# Patient Record
Sex: Male | Born: 1985
Health system: Southern US, Community
[De-identification: ages and names within clinical notes are randomized; demographics above are authoritative.]

## PROBLEM LIST (undated history)

## (undated) ENCOUNTER — Ambulatory Visit

## (undated) ENCOUNTER — Emergency Department (HOSPITAL_COMMUNITY): Discharge status: Absent without leave | Payer: Self-pay

## (undated) DIAGNOSIS — F909 Attention-deficit hyperactivity disorder, unspecified type: Secondary | ICD-10-CM

## (undated) DIAGNOSIS — F1910 Other psychoactive substance abuse, uncomplicated: Secondary | ICD-10-CM

## (undated) DIAGNOSIS — Z21 Asymptomatic human immunodeficiency virus [HIV] infection status: Secondary | ICD-10-CM

---
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
## 2006-06-22 ENCOUNTER — Emergency Department (HOSPITAL_COMMUNITY): Admission: EM | Admit: 2006-06-22 | Discharge: 2006-06-22 | Payer: Self-pay | Admitting: Emergency Medicine

## 2007-03-08 ENCOUNTER — Emergency Department (HOSPITAL_COMMUNITY): Admission: EM | Admit: 2007-03-08 | Discharge: 2007-03-08 | Payer: Self-pay | Admitting: Emergency Medicine

## 2008-03-07 ENCOUNTER — Emergency Department (HOSPITAL_COMMUNITY): Admission: EM | Admit: 2008-03-07 | Discharge: 2008-03-07 | Payer: Self-pay | Admitting: Emergency Medicine

## 2008-03-24 ENCOUNTER — Ambulatory Visit: Payer: Self-pay | Admitting: Psychiatry

## 2008-03-24 ENCOUNTER — Emergency Department (HOSPITAL_COMMUNITY): Admission: EM | Admit: 2008-03-24 | Discharge: 2008-03-24 | Payer: Self-pay | Admitting: Emergency Medicine

## 2008-03-24 ENCOUNTER — Inpatient Hospital Stay (HOSPITAL_COMMUNITY): Admission: RE | Admit: 2008-03-24 | Discharge: 2008-03-27 | Payer: Self-pay | Admitting: Psychiatry

## 2008-09-07 ENCOUNTER — Emergency Department (HOSPITAL_COMMUNITY): Admission: EM | Admit: 2008-09-07 | Discharge: 2008-09-07 | Payer: Self-pay | Admitting: Emergency Medicine

## 2009-11-19 ENCOUNTER — Emergency Department (HOSPITAL_COMMUNITY): Admission: EM | Admit: 2009-11-19 | Discharge: 2009-11-20 | Payer: Self-pay | Admitting: Emergency Medicine

## 2009-12-07 ENCOUNTER — Ambulatory Visit (HOSPITAL_COMMUNITY): Admission: RE | Admit: 2009-12-07 | Discharge: 2009-12-07 | Payer: Self-pay | Admitting: Psychiatry

## 2009-12-08 ENCOUNTER — Emergency Department (HOSPITAL_COMMUNITY): Admission: EM | Admit: 2009-12-08 | Discharge: 2009-12-09 | Payer: Self-pay | Admitting: Emergency Medicine

## 2010-04-25 ENCOUNTER — Emergency Department (HOSPITAL_COMMUNITY): Admission: EM | Admit: 2010-04-25 | Discharge: 2010-04-25 | Payer: Self-pay | Admitting: Emergency Medicine

## 2010-07-23 ENCOUNTER — Emergency Department (HOSPITAL_COMMUNITY)
Admission: EM | Admit: 2010-07-23 | Discharge: 2010-07-23 | Payer: Self-pay | Source: Home / Self Care | Admitting: Emergency Medicine

## 2010-08-02 LAB — GC/CHLAMYDIA PROBE AMP, GENITAL
Chlamydia, DNA Probe: NEGATIVE
GC Probe Amp, Genital: NEGATIVE

## 2010-10-04 LAB — URINALYSIS, ROUTINE W REFLEX MICROSCOPIC
Bilirubin Urine: NEGATIVE
Glucose, UA: NEGATIVE mg/dL
Hgb urine dipstick: NEGATIVE
Ketones, ur: NEGATIVE mg/dL
Nitrite: NEGATIVE
Protein, ur: NEGATIVE mg/dL
Specific Gravity, Urine: 1.012 (ref 1.005–1.030)
Urobilinogen, UA: 2 mg/dL — ABNORMAL HIGH (ref 0.0–1.0)
pH: 6.5 (ref 5.0–8.0)

## 2010-10-04 LAB — RAPID URINE DRUG SCREEN, HOSP PERFORMED
Amphetamines: NOT DETECTED
Barbiturates: NOT DETECTED
Benzodiazepines: POSITIVE — AB
Cocaine: NOT DETECTED
Opiates: NOT DETECTED
Tetrahydrocannabinol: POSITIVE — AB

## 2010-10-04 LAB — CBC
HCT: 49.9 % (ref 39.0–52.0)
Hemoglobin: 16.9 g/dL (ref 13.0–17.0)
MCHC: 33.9 g/dL (ref 30.0–36.0)
MCV: 96 fL (ref 78.0–100.0)
Platelets: 243 10*3/uL (ref 150–400)
RBC: 5.2 MIL/uL (ref 4.22–5.81)
RDW: 13.9 % (ref 11.5–15.5)
WBC: 8.2 10*3/uL (ref 4.0–10.5)

## 2010-10-04 LAB — COMPREHENSIVE METABOLIC PANEL
ALT: 29 U/L (ref 0–53)
AST: 24 U/L (ref 0–37)
Albumin: 4.6 g/dL (ref 3.5–5.2)
Alkaline Phosphatase: 75 U/L (ref 39–117)
BUN: 9 mg/dL (ref 6–23)
CO2: 29 mEq/L (ref 19–32)
Calcium: 9.4 mg/dL (ref 8.4–10.5)
Chloride: 102 mEq/L (ref 96–112)
Creatinine, Ser: 0.88 mg/dL (ref 0.4–1.5)
GFR calc Af Amer: >60 mL/min (ref >60)
GFR calc non Af Amer: >60 mL/min (ref >60)
Glucose, Bld: 98 mg/dL (ref 70–99)
Potassium: 3.2 mEq/L — ABNORMAL LOW (ref 3.5–5.1)
Sodium: 139 mEq/L (ref 135–145)
Total Bilirubin: 1 mg/dL (ref 0.3–1.2)
Total Protein: 7.5 g/dL (ref 6.0–8.3)

## 2010-10-04 LAB — DIFFERENTIAL
Basophils Absolute: 0 10*3/uL (ref 0.0–0.1)
Basophils Relative: 0 % (ref 0–1)
Eosinophils Absolute: 0.2 10*3/uL (ref 0.0–0.7)
Eosinophils Relative: 2 % (ref 0–5)
Lymphocytes Relative: 30 % (ref 12–46)
Lymphs Abs: 2.5 10*3/uL (ref 0.7–4.0)
Monocytes Absolute: 0.6 10*3/uL (ref 0.1–1.0)
Monocytes Relative: 7 % (ref 3–12)
Neutro Abs: 5 10*3/uL (ref 1.7–7.7)
Neutrophils Relative %: 60 % (ref 43–77)

## 2010-10-04 LAB — GC/CHLAMYDIA PROBE AMP, GENITAL
Chlamydia, DNA Probe: NEGATIVE
GC Probe Amp, Genital: NEGATIVE

## 2010-10-04 LAB — ETHANOL: Alcohol, Ethyl (B): 11 mg/dL — ABNORMAL HIGH (ref 0–10)

## 2010-10-05 ENCOUNTER — Emergency Department (HOSPITAL_COMMUNITY)
Admission: EM | Admit: 2010-10-05 | Discharge: 2010-10-05 | Disposition: A | Discharge status: Home / Self Care | Payer: Self-pay | Attending: Emergency Medicine | Admitting: Emergency Medicine

## 2010-10-05 DIAGNOSIS — F1010 Alcohol abuse, uncomplicated: Secondary | ICD-10-CM | POA: Insufficient documentation

## 2010-10-05 DIAGNOSIS — F1210 Cannabis abuse, uncomplicated: Secondary | ICD-10-CM | POA: Insufficient documentation

## 2010-10-05 DIAGNOSIS — F17200 Nicotine dependence, unspecified, uncomplicated: Secondary | ICD-10-CM | POA: Insufficient documentation

## 2010-10-05 LAB — CBC
HCT: 51.3 % (ref 39.0–52.0)
Hemoglobin: 17.3 g/dL — ABNORMAL HIGH (ref 13.0–17.0)
MCHC: 33.7 g/dL (ref 30.0–36.0)
MCV: 96.1 fL (ref 78.0–100.0)
Platelets: 253 10*3/uL (ref 150–400)
RBC: 5.34 MIL/uL (ref 4.22–5.81)
RDW: 13.8 % (ref 11.5–15.5)
WBC: 9.3 10*3/uL (ref 4.0–10.5)

## 2010-10-05 LAB — RAPID URINE DRUG SCREEN, HOSP PERFORMED
Amphetamines: NOT DETECTED
Barbiturates: NOT DETECTED
Benzodiazepines: NOT DETECTED
Cocaine: NOT DETECTED
Opiates: NOT DETECTED
Tetrahydrocannabinol: POSITIVE — AB

## 2010-10-05 LAB — BASIC METABOLIC PANEL
BUN: 10 mg/dL (ref 6–23)
CO2: 24 mEq/L (ref 19–32)
Calcium: 9.5 mg/dL (ref 8.4–10.5)
Chloride: 103 mEq/L (ref 96–112)
Creatinine, Ser: 0.93 mg/dL (ref 0.4–1.5)
GFR calc Af Amer: >60 mL/min (ref >60)
GFR calc non Af Amer: >60 mL/min (ref >60)
Glucose, Bld: 100 mg/dL — ABNORMAL HIGH (ref 70–99)
Potassium: 3.3 mEq/L — ABNORMAL LOW (ref 3.5–5.1)
Sodium: 137 mEq/L (ref 135–145)

## 2010-10-05 LAB — DIFFERENTIAL
Basophils Absolute: 0 10*3/uL (ref 0.0–0.1)
Basophils Relative: 0 % (ref 0–1)
Eosinophils Absolute: 0.3 10*3/uL (ref 0.0–0.7)
Eosinophils Relative: 4 % (ref 0–5)
Lymphocytes Relative: 35 % (ref 12–46)
Lymphs Abs: 3.3 10*3/uL (ref 0.7–4.0)
Monocytes Absolute: 0.8 10*3/uL (ref 0.1–1.0)
Monocytes Relative: 9 % (ref 3–12)
Neutro Abs: 4.9 10*3/uL (ref 1.7–7.7)
Neutrophils Relative %: 52 % (ref 43–77)

## 2010-10-05 LAB — ETHANOL: Alcohol, Ethyl (B): 155 mg/dL — ABNORMAL HIGH (ref 0–10)

## 2010-10-31 ENCOUNTER — Emergency Department (HOSPITAL_COMMUNITY)
Admission: EM | Admit: 2010-10-31 | Discharge: 2010-10-31 | Discharge status: Against Medical Advice | Payer: Self-pay | Attending: Emergency Medicine | Admitting: Emergency Medicine

## 2010-10-31 DIAGNOSIS — M549 Dorsalgia, unspecified: Secondary | ICD-10-CM | POA: Insufficient documentation

## 2010-10-31 DIAGNOSIS — R112 Nausea with vomiting, unspecified: Secondary | ICD-10-CM | POA: Insufficient documentation

## 2010-10-31 DIAGNOSIS — R109 Unspecified abdominal pain: Secondary | ICD-10-CM | POA: Insufficient documentation

## 2010-11-30 NOTE — Discharge Summary (Signed)
NAMEDAMAREA, MERKEL NO.:  [id number]   MEDICAL RECORD NO.:  [id number]          PATIENT TYPE:  IPS   LOCATION:  0305                          FACILITY:  [facility]   PHYSICIAN:  Jasmine Pang, M.D. DATE OF BIRTH:  05-09-1986   DATE OF ADMISSION:  03/24/2008  DATE OF DISCHARGE:  03/27/2008                               DISCHARGE SUMMARY   IDENTIFICATION:  This is a 25 year old single white male who was  admitted on a voluntary basis on March 24, 2008.   HISTORY OF PRESENT ILLNESS:  The patient presents reporting that he had  a nervous breakdown.  He reported multiple stressors.  He has been using  alcohol, drinking a six-pack daily.  He has also been using other drugs  including marijuana and cocaine.  He was endorsing passive suicidal  thoughts, but feels that was due to the mixture of alcohol and drug use.  He has been experiencing nightmares from past relationship issues.  Appetite has been satisfactory.  Stressors are that mother is getting a  divorce and currently has a man that has moved in with her.  Also, the  patient is having problems with roommates that are not working out.   PSYCHIATRIC HISTORY:  This is the first admission to [facility].  He was hospitalized in the Child and Adolescent Unit at the age  of 80.  He has had no current outpatient mental health treatment.   FAMILY HISTORY:  Mother may be bipolar, though she has not been  diagnosed.   ALCOHOL AND DRUG HISTORY:  The patient has been experiencing blackouts.  He did get very belligerent and agitated in the emergency room with an  alcohol level of 334.  Boneta Lucks has been using marijuana and cocaine.   MEDICAL PROBLEMS:  Denies any acute or chronic health issues.   MEDICATIONS:  None prior to arrival.   DRUG ALLERGIES:  No known drug allergies.   PHYSICAL FINDINGS:  There were no acute physical or medical problems.  The patient was fully assessed in the [facility] ED.   LABORATORY DATA:  His laboratory data shows a CBC within normal limits.  Salicylate level less than 4.  Urine drug screen was positive for  cocaine, positive for THC.  Alcohol level was 334.  Urinalysis was  negative.   HOSPITAL COURSE:  Upon admission, the patient was started on the Librium  detox protocol.  He was also placed on a nicotine patch 24 mg as per  smoking cessation protocol.  Due to his anxiety, he was started on  Neurontin 100 mg p.o. q.4 h. p.r.n. anxiety.  He was also started on  Celexa 10 mg p.o. q. day.  The patient tolerated these medications well  with no significant side effects.  In individual sessions with me, he  was friendly and cooperative.  He also participated appropriately in  unit therapeutic groups and activities.  He discussed his legal charges  for DUI and having drug paraphernalia.  The legal charges are pending.  He has a Clinical research associate, he states he  has been having bad anxiety attacks.  His  Celexa was increased to 20 mg p.o. q. day.  On March 26, 2008, mental  status had improved somewhat.  He was less depressed, less anxious.  There was no suicidal ideation.  He wanted a family session with his  friend and roommate.  This was done and he received a lot of support  from both of them.  He felt good after the family session.  He is going  to return with his friend in [place].  His mother planned to pick him  up upon discharge and is going to go to [facility] for followup  treatment.  On March 27, 2008, mental status had improved markedly  from admission status.  The patient was less depressed, less anxious.  Affect was consistent with mood.  There was no suicidal or homicidal  ideation.  No thoughts of self-injurious behavior.  No auditory or  visual hallucinations.  No paranoia or delusions.  Thoughts were logical  and goal-directed.  Thought content, no predominant theme.  Cognitive  was grossly intact.  Insight was good.  Judgment was good.   His impulse  control was good.  It was felt the patient was ready for discharge.   DISCHARGE DIAGNOSES:  Axis I:  Depressive disorder, not otherwise  specified. Alcohol abuse (rule out dependence).  Polysubstance abuse.  Axis II:  None.  Axis III:  No known medical conditions.  Axis IV:  Severe (problems with occupation, housing, economic issues,  and other psychosocial problems).  Axis V:  Global assessment of functioning was 50 upon discharge.  GAF  was 35 upon admission.  GAF highest past year was 65.   DISCHARGE PLANS:  There was no specific activity level or dietary  restriction.   POSTHOSPITAL CARE PLANS:  The patient will go to [facility] on  March 27, 2008, at 3 o'clock.  He will also go to [facility]  for counseling.   DISCHARGE MEDICATIONS:  1. Librium 25 mg 1 pill today and 1 pill tomorrow, then discontinue      and detox will be complete.  2. Celexa 20 mg daily.  3. Trazodone 50 mg at bedtime p.r.n. insomnia.      Jasmine Pang, M.D.  Electronically Signed     BHS/MEDQ  D:  03/27/2008  T:  03/27/2008  Job:  [id number]

## 2010-11-30 NOTE — H&P (Signed)
NAMEGIANNO, [name] NO.:  [id number]   MEDICAL RECORD NO.:  [id number]          PATIENT TYPE:  IPS   LOCATION:  0305                          FACILITY:  [facility]   PHYSICIAN:  Jasmine Pang, M.D. DATE OF BIRTH:  1986/06/22   DATE OF ADMISSION:  03/24/2008  DATE OF DISCHARGE:  03/24/2008                       PSYCHIATRIC ADMISSION ASSESSMENT   This is a 25 year old male who is voluntarily admitted on March 24, 2008.   HISTORY OF PRESENT ILLNESS:  The patient presents reporting that he had  a nervous breakdown.  Reporting multiple stressors.  Has been using  alcohol, drinking a six-pack daily.  Also using other drugs-- marijuana,  cocaine.  Was endorsing passive suicidal thoughts, but feels it was due  to the mixture of alcohol and drug use.  He has been experiencing  nightmares from past relationship issues.  Appetite has been  satisfactory.  Stressors are that mother is getting a divorce and  currently has a man that has moved in with her.  Also the patient is  having problems with roommates that are not working out.   PAST PSYCHIATRIC HISTORY:  First admission to [facility].  He was hospitalized on the child/adolescent unit at the age of 1.  No  current outpatient mental health treatment.   SOCIAL HISTORY:  A 25 year old male who lives in [place].  He is  single.  He is currently unemployed.  He is attending online courses.  Has a DUI pending.  Also pending charges on possession of substance use  and illegally tinted windows.  He is currently employed at this time,  but did work at the [employer] in [place] and found that  enjoyable.  He endorses that his mother is supportive.  Having some  conflict with his younger brother at the age of 87.   FAMILY HISTORY:  Mother who he thinks is possible bipolar.   ALCOHOL/DRUG HISTORY:  The patient has been experiencing blackouts.  Did  get very belligerent and agitated in the emergency  room with an alcohol  level of 334.  Again, has been using marijuana and cocaine.   PRIMARY CARE [place]:  None.   MEDICAL PROBLEMS:  Denies any acute or chronic health issues.   MEDICATIONS:  None prior to arrival.   DRUG ALLERGIES:  NO KNOWN ALLERGIES.   PHYSICAL EXAMINATION:  GENERAL:  This is a young male, very cooperative  and appears in no acute distress.  Denies any complaints.  He was fully  assessed at [facility] where he was belligerent,  verbally abusive and was in restraints for a small period of time.  VITAL SIGNS:  Temperature 97.8, 81 heart rate, 22 respirations, blood  pressure is 149/77.   LABORATORY DATA:  His laboratory data shows a CBC within normal limits.  Salicylate less than 4.  Urine drug screen is positive for cocaine,  positive for THC.  Alcohol level of 334.  Urinalysis is negative.   MENTAL STATUS EXAM:  He is fully alert, cooperative, good eye contact,  casually dressed.  The speech  is clear, normal pace and tone.  The  patient's mood is anxious and depressed.  The patient is cooperative,  pleasant, sense of humor, polite.  Thought processes, denies any  suicidal thoughts.  No evidence of any psychotic symptoms.  No  delusional statements.  Cognitive function intact.  Memory appears to be  good.  Judgment and insight is fair.  Poor impulse control related to  alcohol use.   AXIS I:  Mood disorder.  Alcohol abuse, rule out dependence,  polysubstance abuse.  AXIS II:  Deferred.  AXIS III:  No known medical conditions.  AXIS IV:  Problems with occupation, housing, economic issues and other  psychosocial problems.  AXIS V:  Current is 35.   PLAN:  Detox the patient with Librium protocol.  Work on relapse  prevention.  We will initiate the Celexa.  Risk and benefits are  discussed with the patient.  The patient is agreeable to medication.  We  will also Neurontin available on a p.r.n. basis for anxiety.  We offered  a family  session with his mother.  Case manager will contact mother for  possible session.  The patient's case manager will obtain his follow up  appointments.  The patient will be in the dual diagnosis program.  Tentative length of stay at this time is 3-5 days.      Landry Corporal, N.P.      Jasmine Pang, M.D.  Electronically Signed    JO/MEDQ  D:  03/25/2008  T:  03/25/2008  Job:  [id number]

## 2010-12-03 NOTE — H&P (Signed)
[facility]  Patient:    [name], [name]                          MRN: [id number] Adm. Date:  [id number] Attending:  Veneta Penton                   Psychiatric Admission Assessment  DATE OF ADMISSION:  September 08, 2000.  IDENTIFICATION:  Patient is a 25 year old Caucasian male from [place], [place] who is currently in the 9th grade.  He is in the process of switching to the [employer] to better address his learning problems.  HISTORY OF PRESENT ILLNESS:  Patient complains of depression.  He was treated on an outpatient basis by Dr. Haynes Hoehn with Effexor XR 112.5 mg q.a.m.  He states that he has been picked on by other students at school who tell him "I talk like a girl."  He states he is glad to be changing to [employer] and hopes this will be a new start.  On the night of admission he ran away and told his mother he was going to cut his wrists.  She did not feel safe keeping him in the home setting.  PAST PSYCHIATRIC HISTORY:  Patient has a history of attention deficit hyperactivity disorder treated with Ritalin and other stimulants.  He currently sees Dr. Haynes Hoehn and is on the Effexor as indicated in the history of present illness.  SUBSTANCE ABUSE HISTORY:  None.  He denies cigarette use also.  PAST MEDICAL HISTORY:  Patient is healthy.  ALLERGIES:  No known drug allergies.  CURRENT MEDICATIONS:  As per past psychiatric history.  FAMILY/SOCIAL HISTORY:  Patient lives with his mother, stepfather and 39 year old brother.  He is in the 9th grade and as indicated above is changing to [employer].  There is no history of physical or sexual abuse.  Family psychiatric history:  None known according to patient.  Legal history:  Patient shoplifted x 1.  He did community service at an [employer] for this.  MENTAL STATUS EXAMINATION:  Patient presented as a reserved, cooperative male dressed casually.  His  eye contact was good.  Speech soft and slow, and there was psychomotor retardation.  Mood was depressed, affect sad, constricted. Positive suicidal ideation as per history of present illness.  No homicidal ideation.  No psychosis or perceptual disturbance.  Thought processes were logical and goal directed.  Thought content revealed no predominant theme.  On cognitive exam, patient was alert and oriented to person, place, time and reason for being in the hospital.  Short term and long term memory were adequate.  General fund of knowledge:  Patient describes himself as being "slow" and states he is in special classes at school for this.  His IQ is unknown.  Attention and concentration diminished.  Insight minimal, judgment poor.  ADMISSION DIAGNOSES: Axis I:    1. Major depression, recurrent, severe.            2. Attention deficit hyperactivity disorder, not otherwise               specified. Axis II:   Deferred. Axis III:  Healthy. Axis IV:   Severe. Axis V:    Global assessment of function of 10.  ASSETS AND STRENGTHS:  Healthy, supportive family.  Change to a school that will fit his needs better.  PROBLEMS:  Mood instability with suicidal ideation.  SHORT  TERM TREATMENT GOAL:  Resolution of suicidal ideation.  LONG TERM TREATMENT GOAL:  Resolution of mood instability.  INITIAL PLAN OF CARE:  Continue Effexor.  Patient will be involved in unit therapeutic groups and activities and family therapy.  ESTIMATED LENGTH OF INPATIENT TREATMENT:  Five to seven days.  CONDITION NECESSARY FOR DISCHARGE:  No longer suicidal.  POST HOSPITAL CARE PLANS:  Return home to live with family.  Follow up medication management will be with Dr. Haynes Hoehn.  Follow up therapy will be with whoever he is presently seeing (? whether he does have a therapist at this point). DD:  09/10/00 TD:  09/11/00 Job: [id number]
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
## 2011-04-20 LAB — HEPATIC FUNCTION PANEL
ALT: 18
AST: 23
Albumin: 4
Alkaline Phosphatase: 67
Bilirubin, Direct: 0.1
Indirect Bilirubin: 0.7
Total Bilirubin: 0.8
Total Protein: 6.5

## 2011-04-20 LAB — DIFFERENTIAL
Basophils Absolute: 0
Basophils Relative: 0
Eosinophils Absolute: 0
Eosinophils Relative: 1
Lymphocytes Relative: 45
Lymphs Abs: 3
Monocytes Absolute: 0.4
Monocytes Relative: 6
Neutro Abs: 3.2
Neutrophils Relative %: 48

## 2011-04-20 LAB — POCT I-STAT, CHEM 8
BUN: <3 — ABNORMAL LOW
Calcium, Ion: 1.03 — ABNORMAL LOW
Chloride: 104
Creatinine, Ser: 1.1
Glucose, Bld: 96
HCT: 51
Hemoglobin: 17.3 — ABNORMAL HIGH
Potassium: 3 — ABNORMAL LOW
Sodium: 143
TCO2: 25

## 2011-04-20 LAB — URINALYSIS, ROUTINE W REFLEX MICROSCOPIC
Bilirubin Urine: NEGATIVE
Glucose, UA: NEGATIVE
Hgb urine dipstick: NEGATIVE
Ketones, ur: NEGATIVE
Nitrite: NEGATIVE
Protein, ur: NEGATIVE
Specific Gravity, Urine: 1.004 — ABNORMAL LOW
Urobilinogen, UA: 0.2
pH: 5.5

## 2011-04-20 LAB — RAPID URINE DRUG SCREEN, HOSP PERFORMED
Amphetamines: NOT DETECTED
Barbiturates: NOT DETECTED
Benzodiazepines: NOT DETECTED
Cocaine: POSITIVE — AB
Opiates: NOT DETECTED
Tetrahydrocannabinol: POSITIVE — AB

## 2011-04-20 LAB — CBC
HCT: 49.2
Hemoglobin: 17
MCHC: 34.4
MCV: 95.3
Platelets: 235
RBC: 5.17
RDW: 13.7
WBC: 6.6

## 2011-04-20 LAB — ETHANOL: Alcohol, Ethyl (B): 334 — ABNORMAL HIGH

## 2011-04-20 LAB — ACETAMINOPHEN LEVEL: Acetaminophen (Tylenol), Serum: <10 — ABNORMAL LOW

## 2011-04-20 LAB — SALICYLATE LEVEL: Salicylate Lvl: <4

## 2011-04-29 LAB — GC/CHLAMYDIA PROBE AMP, GENITAL
Chlamydia, DNA Probe: NEGATIVE
GC Probe Amp, Genital: NEGATIVE

## 2011-04-29 LAB — RPR: RPR Ser Ql: NONREACTIVE

## 2011-04-29 LAB — ETHANOL: Alcohol, Ethyl (B): 246 — ABNORMAL HIGH

## 2014-01-26 ENCOUNTER — Encounter (HOSPITAL_COMMUNITY): Payer: Self-pay | Admitting: Emergency Medicine

## 2014-01-26 ENCOUNTER — Emergency Department (HOSPITAL_COMMUNITY)
Admission: EM | Admit: 2014-01-26 | Discharge: 2014-01-26 | Disposition: A | Discharge status: Home / Self Care | Payer: Self-pay | Attending: Emergency Medicine | Admitting: Emergency Medicine

## 2014-01-26 DIAGNOSIS — Z202 Contact with and (suspected) exposure to infections with a predominantly sexual mode of transmission: Secondary | ICD-10-CM | POA: Insufficient documentation

## 2014-01-26 DIAGNOSIS — R05 Cough: Secondary | ICD-10-CM | POA: Insufficient documentation

## 2014-01-26 DIAGNOSIS — F17200 Nicotine dependence, unspecified, uncomplicated: Secondary | ICD-10-CM | POA: Insufficient documentation

## 2014-01-26 DIAGNOSIS — R059 Cough, unspecified: Secondary | ICD-10-CM | POA: Insufficient documentation

## 2014-01-26 LAB — HIV ANTIBODY (ROUTINE TESTING W REFLEX): HIV 1&2 Ab, 4th Generation: NONREACTIVE

## 2014-01-26 LAB — RPR TITER: RPR Titer: 1:32 {titer} — AB

## 2014-01-26 LAB — RPR: RPR Ser Ql: REACTIVE — AB

## 2014-01-26 MED ORDER — CEFTRIAXONE SODIUM 250 MG IJ SOLR
250.0000 mg | Freq: Once | INTRAMUSCULAR | Status: AC
  Administered 2014-01-26: 250 mg via INTRAMUSCULAR
  Filled 2014-01-26: qty 250

## 2014-01-26 MED ORDER — AZITHROMYCIN 250 MG PO TABS
1000.0000 mg | ORAL_TABLET | Freq: Once | ORAL | Status: AC
  Administered 2014-01-26: 1000 mg via ORAL
  Filled 2014-01-26: qty 4

## 2014-01-26 NOTE — Discharge Instructions (Signed)
Refrain from sexual intercourse for 7 days. Be sure to have all partners tested and treated for STDs. You and/or your partners may also be tested and treated by your family provider or health department. Practice safe sex by always wearing condoms. See below for further instructions.

## 2014-01-26 NOTE — ED Provider Notes (Signed)
CSN: [id number]     Arrival date & time 01/26/14  1257 History  This chart was scribed for non-physician practitioner, Junius FinnerErin O'Malley, PA-C working with Suzi RootsKevin E Steinl, MD by Greggory StallionKayla Andersen, ED scribe. This patient was seen in room WTR8/WTR8 and the patient's care was started at 1:41 PM.    Chief Complaint  Patient presents with  . Cough    cough x 1   . Exposure to STD    1 month post exposure to STD   The history is provided by the patient. No language interpreter was used.   HPI Comments: [name] is a 28 y.o. male who presents to the Emergency Department complaining of STD exposure about one month ago. States his partner recently called him and was told the partner had chlamydia. Reports unprotected sex. Pt is also complaining of a productive cough that started one week ago.  Denies fever, nausea, emesis, dysuria, penile discharge, scrotal pain or swelling. Denies rashes.   History reviewed. No pertinent past medical history. History reviewed. No pertinent past surgical history. Family History  Problem Relation Age of Onset  . Diabetes Mother   . Hypertension Mother   . Cancer Mother    History  Substance Use Topics  . Smoking status: Current Every Day Smoker    Types: Cigarettes  . Smokeless tobacco: Not on file  . Alcohol Use: Yes    Review of Systems  Constitutional: Negative for fever.  Respiratory: Positive for cough.   Gastrointestinal: Negative for nausea and vomiting.  Genitourinary: Negative for dysuria and discharge.  All other systems reviewed and are negative.  Allergies  Review of patient's allergies indicates no known allergies.  Home Medications   Prior to Admission medications   Not on File   BP 128/76  Pulse 102  Temp(Src) 97.5 F (36.4 C) (Oral)  Resp 20  SpO2 97%  Physical Exam  Nursing note and vitals reviewed. Constitutional: He is oriented to person, place, and time. He appears well-developed and well-nourished.  HENT:  Head:  Normocephalic and atraumatic.  Eyes: EOM are normal.  Neck: Normal range of motion.  Cardiovascular: Normal rate, regular rhythm and normal heart sounds.   Pulmonary/Chest: Effort normal and breath sounds normal. No respiratory distress. He has no wheezes. He has no rales. He exhibits no tenderness.  Genitourinary: Testes normal and penis normal. Right testis shows no mass, no swelling and no tenderness. Left testis shows no mass, no swelling and no tenderness. No penile erythema or penile tenderness. No discharge found.  Examination chaperoned by Greggory StallionKayla Andersen. No penile discharge, pain, swelling, lesions. No scrotal swelling or tenderness.   Musculoskeletal: Normal range of motion.  Neurological: He is alert and oriented to person, place, and time.  Skin: Skin is warm and dry.  Psychiatric: He has a normal mood and affect. His behavior is normal.    ED Course  Procedures (including critical care time)  DIAGNOSTIC STUDIES: Oxygen Saturation is 97% on RA, normal by my interpretation.    COORDINATION OF CARE: 1:43 PM-Discussed treatment plan which includes STD testing and treatment with pt at bedside and pt agreed to plan.   Labs Review Labs Reviewed  GC/CHLAMYDIA PROBE AMP  RPR  HIV ANTIBODY (ROUTINE TESTING)    Imaging Review No results found.   EKG Interpretation None      MDM   Final diagnoses:  Exposure to chlamydia  Cough    Pt is a 27yo male presenting to ED requesting to be tested  for STD as he reports exposure to chlamydia.  Denies GU symptoms at this time. Does report cold-like symptoms of productive cough x1 week. Pt is afebrile, no respiratory distress. Lungs: CTAB.  Will test for GC/chlamydia, HIV and RPR (syphalis). Empiric tx of azithromycin and rocephin given in ED. Advised to abstain from intercourse x7 days, use protection and encouraged his partners tested and tx for STDs. Return precautions provided. Pt verbalized understanding and agreement with tx  plan.   I personally performed the services described in this documentation, which was scribed in my presence. The recorded information has been reviewed and is accurate.  Junius Finner, PA-C 01/26/14 1415

## 2014-01-26 NOTE — ED Notes (Signed)
Pt reports exposure to STD one  month  Ago. C/o productive cough x 1 week

## 2014-01-27 ENCOUNTER — Encounter (HOSPITAL_COMMUNITY): Payer: Self-pay | Admitting: Emergency Medicine

## 2014-01-27 ENCOUNTER — Emergency Department (HOSPITAL_COMMUNITY)
Admission: EM | Admit: 2014-01-27 | Discharge: 2014-01-27 | Disposition: A | Discharge status: Home / Self Care | Payer: Self-pay | Attending: Emergency Medicine | Admitting: Emergency Medicine

## 2014-01-27 DIAGNOSIS — R21 Rash and other nonspecific skin eruption: Secondary | ICD-10-CM | POA: Insufficient documentation

## 2014-01-27 DIAGNOSIS — F17200 Nicotine dependence, unspecified, uncomplicated: Secondary | ICD-10-CM | POA: Insufficient documentation

## 2014-01-27 DIAGNOSIS — A539 Syphilis, unspecified: Secondary | ICD-10-CM | POA: Insufficient documentation

## 2014-01-27 DIAGNOSIS — Z79899 Other long term (current) drug therapy: Secondary | ICD-10-CM | POA: Insufficient documentation

## 2014-01-27 LAB — GC/CHLAMYDIA PROBE AMP
CT Probe RNA: NEGATIVE
GC Probe RNA: NEGATIVE

## 2014-01-27 LAB — T.PALLIDUM AB, IGG: T pallidum Antibodies (TP-PA): >8 S/CO — ABNORMAL HIGH (ref <0.90)

## 2014-01-27 MED ORDER — FAMOTIDINE 20 MG PO TABS
20.0000 mg | ORAL_TABLET | Freq: Once | ORAL | Status: AC
  Administered 2014-01-27: 20 mg via ORAL
  Filled 2014-01-27: qty 1

## 2014-01-27 MED ORDER — PENICILLIN G BENZATHINE 1200000 UNIT/2ML IM SUSP
2.4000 10*6.[IU] | Freq: Once | INTRAMUSCULAR | Status: AC
  Administered 2014-01-27: 2.4 10*6.[IU] via INTRAMUSCULAR
  Filled 2014-01-27: qty 4

## 2014-01-27 MED ORDER — PREDNISONE 20 MG PO TABS: 60.0000 mg | ORAL_TABLET | Freq: Every day | ORAL | Status: DC

## 2014-01-27 MED ORDER — PENICILLIN G BENZATHINE 1200000 UNIT/2ML IM SUSP
2.4000 10*6.[IU] | Freq: Once | INTRAMUSCULAR | Status: DC
Start: 2014-01-27 — End: 2014-01-27

## 2014-01-27 MED ORDER — DIPHENHYDRAMINE HCL 25 MG PO CAPS
50.0000 mg | ORAL_CAPSULE | Freq: Once | ORAL | Status: AC
  Administered 2014-01-27: 50 mg via ORAL
  Filled 2014-01-27: qty 2

## 2014-01-27 MED ORDER — PREDNISONE 20 MG PO TABS
60.0000 mg | ORAL_TABLET | Freq: Once | ORAL | Status: AC
  Administered 2014-01-27: 60 mg via ORAL
  Filled 2014-01-27: qty 3

## 2014-01-27 NOTE — ED Provider Notes (Signed)
CSN: [id number]     Arrival date & time 01/27/14  1308 History   First MD Initiated Contact with Patient 01/27/14 1404    This chart was scribed for non-physician practitioner working with Audree Camel, MD, by Andrew Au, ED Scribe. This patient was seen in room WTR1/WLPT1 and the patient's care was started at 2:49 PM.  Chief Complaint  Patient presents with  . Rash   Patient is a 28 y.o. male presenting with rash. The history is provided by the patient. No language interpreter was used.  Rash Associated symptoms: no fatigue, no fever, no shortness of breath and no sore throat    [name] is a 28 y.o. male who presents to the Emergency Department complaining of a blotchy erythematous rash to bilateral legs noticed 3 hours ago. Pt denies rash being painful. He reports that the rash does itch. Pt denies swelling lips, tongue and throat. Pt denies fever and chills. Pt denies SOB.  No nausea or vomiting.  Patient denies new detergents, lotions, or soaps.  He has not tried any treatment for the rash prior to coming to the ED.   Pt was seen here 1 day ago for possible STD. Pt was treated prophylactically with rocephin and azithromycin at that time. Review of the chart shows that Syphilis testing was positive.  History reviewed. No pertinent past medical history. History reviewed. No pertinent past surgical history. Family History  Problem Relation Age of Onset  . Diabetes Mother   . Hypertension Mother   . Cancer Mother    History  Substance Use Topics  . Smoking status: Current Every Day Smoker    Types: Cigarettes  . Smokeless tobacco: Not on file  . Alcohol Use: Yes    Review of Systems  Constitutional: Negative for fever, chills and fatigue.  HENT: Negative for sore throat and trouble swallowing.   Respiratory: Negative for chest tightness and shortness of breath.   Cardiovascular: Negative for chest pain.  Skin: Positive for rash.    Allergies  Review of patient's  allergies indicates no known allergies.  Home Medications   Prior to Admission medications   Medication Sig Start Date End Date Taking? Authorizing Provider  ibuprofen (ADVIL,MOTRIN) 200 MG tablet Take 400 mg by mouth every 6 (six) hours as needed for mild pain.   Yes Historical Provider, MD   BP 118/70  Pulse 71  Temp(Src) 98.5 F (36.9 C) (Oral)  Resp 16  SpO2 97% Physical Exam  Nursing note and vitals reviewed. Constitutional: He is oriented to person, place, and time. He appears well-developed and well-nourished. No distress.  HENT:  Head: Normocephalic and atraumatic.  Mouth/Throat: Uvula is midline and oropharynx is clear and moist. No oral lesions. No trismus in the jaw. No uvula swelling.  No swelling of the lips, tongue, or throat.  Airway widely patent.  Eyes: Conjunctivae and EOM are normal.  Neck: Normal range of motion. Neck supple.  Cardiovascular: Normal rate, regular rhythm and normal heart sounds.   Pulmonary/Chest: Effort normal and breath sounds normal.  Musculoskeletal: Normal range of motion.  Neurological: He is alert and oriented to person, place, and time.  Skin: Skin is warm and dry.  diffuse erythematous blotchy blanchable macular rash to bilateral legs  Psychiatric: He has a normal mood and affect. His behavior is normal.   ED Course  Procedures  DIAGNOSTIC STUDIES: Oxygen Saturation is 97% on RA, normal by my interpretation.    COORDINATION OF CARE: 2:54 PM- Pt  advised of plan for treatment including shot of penicillin  and benadryl and pt agrees.  Labs Review Labs Reviewed - No data to display  Imaging Review No results found.   EKG Interpretation None      MDM   Final diagnoses:  None   Patient presenting with a pruritic rash located on both legs.  No swelling of the lips, tongue, or throat.  Patient is afebrile.  No oral lesions.  Review of the chart shows that patient was in the ED yesterday for STD check.  Review of labs shows a  reactive RPR.  Patient then given IM PCN today while in the ED today to treat for Syphilis.   I personally performed the services described in this documentation, which was scribed in my presence. The recorded information has been reviewed and is accurate.      Santiago GladHeather Johnte Portnoy, PA-C 01/29/14 2248

## 2014-01-27 NOTE — ED Notes (Signed)
Pt reports to ED for reddened skin rash to legs bilaterally. Pt was seen for same yesterday.

## 2014-01-27 NOTE — ED Notes (Signed)
Pt seen yesterday for possible STD and medicated with antibiotics.  Pt started having rash 2-3 hours ago.  Noted bumps to bil legs.  No difficulty with swallowing/breathing.  No meds taken at home.

## 2014-01-27 NOTE — Progress Notes (Signed)
[facility] provided pt with a list of primary care resources to help patient establish primary care.  °

## 2014-01-29 NOTE — ED Provider Notes (Signed)
Medical screening examination/treatment/procedure(s) were performed by non-physician practitioner and as supervising physician I was immediately available for consultation/collaboration.     Zierra Laroque E Anntionette Madkins, MD 01/29/14 0718 

## 2014-01-30 NOTE — ED Provider Notes (Signed)
Medical screening examination/treatment/procedure(s) were performed by non-physician practitioner and as supervising physician I was immediately available for consultation/collaboration.   EKG Interpretation None        Minetta Krisher T Rontrell Moquin, MD 01/30/14 1727 

## 2014-03-09 ENCOUNTER — Emergency Department (HOSPITAL_COMMUNITY)
Admission: EM | Admit: 2014-03-09 | Discharge: 2014-03-09 | Disposition: A | Discharge status: Home / Self Care | Payer: Self-pay | Attending: Emergency Medicine | Admitting: Emergency Medicine

## 2014-03-09 ENCOUNTER — Encounter (HOSPITAL_COMMUNITY): Payer: Self-pay | Admitting: Emergency Medicine

## 2014-03-09 DIAGNOSIS — F17200 Nicotine dependence, unspecified, uncomplicated: Secondary | ICD-10-CM | POA: Insufficient documentation

## 2014-03-09 DIAGNOSIS — R21 Rash and other nonspecific skin eruption: Secondary | ICD-10-CM | POA: Insufficient documentation

## 2014-03-09 DIAGNOSIS — IMO0002 Reserved for concepts with insufficient information to code with codable children: Secondary | ICD-10-CM | POA: Insufficient documentation

## 2014-03-09 MED ORDER — DIPHENHYDRAMINE HCL 25 MG PO TABS: 50.0000 mg | ORAL_TABLET | ORAL | Status: DC | PRN

## 2014-03-09 MED ORDER — TRIAMCINOLONE ACETONIDE 0.1 % EX CREA: 1.0000 "application " | TOPICAL_CREAM | Freq: Four times a day (QID) | CUTANEOUS | Status: DC | PRN

## 2014-03-09 NOTE — ED Notes (Signed)
He c/o small patch of itchy rash at post. Left shoulder area.  Dr. Fonnie Jarvis is seeing him as I write this.  He states he otherwise feels healthy.

## 2014-03-09 NOTE — ED Provider Notes (Signed)
CSN: [id number]     Arrival date & time 03/09/14  1716 History   First MD Initiated Contact with Patient 03/09/14 1732     Chief Complaint  Patient presents with  . Rash     (Consider location/radiation/quality/duration/timing/severity/associated sxs/prior Treatment) HPI 28 year old male with 3 days of itching nonpainful nonbruising non-blistering small rash to the left upper arm without recollection of specific bites or sting and with no fever and otherwise feels well with no body aches no cough no chest pain no shortness of breath no other rash and no other concerns. He is no tongue swelling no lip swelling no drooling no voice change no neck swelling. There is no treatment prior to arrival. History reviewed. No pertinent past medical history. No past surgical history on file. Family History  Problem Relation Age of Onset  . Diabetes Mother   . Hypertension Mother   . Cancer Mother    History  Substance Use Topics  . Smoking status: Current Every Day Smoker    Types: Cigarettes  . Smokeless tobacco: Not on file  . Alcohol Use: Yes    Review of Systems  10 Systems reviewed and are negative for acute change except as noted in the HPI.  Allergies  Review of patient's allergies indicates no known allergies.  Home Medications   Prior to Admission medications   Medication Sig Start Date End Date Taking? Authorizing Provider  ibuprofen (ADVIL,MOTRIN) 200 MG tablet Take 400 mg by mouth every 6 (six) hours as needed for mild pain.   Yes Historical Provider, MD  diphenhydrAMINE (BENADRYL) 25 MG tablet Take 2 tablets (50 mg total) by mouth every 4 (four) hours as needed for itching. 03/09/14   Hurman Horn, MD  predniSONE (DELTASONE) 20 MG tablet Take 3 tablets (60 mg total) by mouth daily. 01/27/14   Heather Laisure, PA-C  triamcinolone cream (KENALOG) 0.1 % Apply 1 application topically 4 (four) times daily as needed. 03/09/14   Hurman Horn, MD   BP 123/66  Pulse 90  Temp(Src)  98.4 F (36.9 C) (Oral)  Resp 16  SpO2 99% Physical Exam  Nursing note and vitals reviewed. Constitutional:  Awake, alert, nontoxic appearance.  HENT:  Head: Atraumatic.  Mouth/Throat: Oropharynx is clear and moist.  Tongue normal; lips normal  Eyes: Right eye exhibits no discharge. Left eye exhibits no discharge.  Neck: Neck supple.  Cardiovascular: Normal rate and regular rhythm.   No murmur heard. Pulmonary/Chest: Effort normal and breath sounds normal. No respiratory distress. He has no wheezes. He has no rales. He exhibits no tenderness.  Abdominal: Soft. Bowel sounds are normal. He exhibits no distension. There is no tenderness. There is no rebound and no guarding.  Musculoskeletal: He exhibits no tenderness.  Baseline ROM, no obvious new focal weakness.  Neurological: He is alert.  Mental status and motor strength appears baseline for patient and situation.  Skin: Rash noted.  Left upper arm over triceps area reveals several 1 cm erythematous papules without vesicles without pustules without tenderness without surrounding cellulitis without fluctuance with area suggestive of possible recent insect bites  Psychiatric: He has a normal mood and affect.    ED Course  Procedures (including critical care time) Patient informed of clinical course, understand medical decision-making process, and agree with plan. Labs Review Labs Reviewed - No data to display  Imaging Review No results found.   EKG Interpretation None      MDM   Final diagnoses:  Rash  I doubt any other EMC precluding discharge at this time including, but not necessarily limited to the following:SBI, TEN, SJS, anaphylaxis.    Hurman Horn, MD 03/18/14 [phone]

## 2014-10-30 ENCOUNTER — Emergency Department (HOSPITAL_COMMUNITY)
Admission: EM | Admit: 2014-10-30 | Discharge: 2014-10-30 | Disposition: A | Discharge status: Home / Self Care | Payer: Self-pay | Attending: Emergency Medicine | Admitting: Emergency Medicine

## 2014-10-30 ENCOUNTER — Encounter (HOSPITAL_COMMUNITY): Payer: Self-pay | Admitting: *Deleted

## 2014-10-30 DIAGNOSIS — B356 Tinea cruris: Secondary | ICD-10-CM | POA: Insufficient documentation

## 2014-10-30 DIAGNOSIS — Z720 Tobacco use: Secondary | ICD-10-CM | POA: Insufficient documentation

## 2014-10-30 DIAGNOSIS — Z7952 Long term (current) use of systemic steroids: Secondary | ICD-10-CM | POA: Insufficient documentation

## 2014-10-30 MED ORDER — CLOTRIMAZOLE 1 % EX CREA: TOPICAL_CREAM | CUTANEOUS | Status: DC

## 2014-10-30 NOTE — ED Notes (Signed)
Pt states that he had unprotected sex approx 1 week ago; pt states that he began with a rash to his buttocks 3-4 days ago; pt describes the rash as itchy and irritating; minimal amount of small red raised bumps noted to left buttock; pt concerned about STD exposure

## 2014-10-30 NOTE — ED Notes (Signed)
Patient verbalizes understanding of discharge instructions, prescription medications home care and follow up care. Patient ambulatory out of department at this time.

## 2014-10-30 NOTE — ED Provider Notes (Signed)
CSN: [id number]     Arrival date & time 10/30/14  [id number] History   First MD Initiated Contact with Patient 10/30/14 [phone]     Chief Complaint  Patient presents with  . Rash     (Consider location/radiation/quality/duration/timing/severity/associated sxs/prior Treatment) Patient is a 29 y.o. male presenting with rash. The history is provided by the patient.  Rash Location:  Ano-genital (buttocks) Ano-genital rash location:  L buttock Quality: itchiness   Severity:  Mild Onset quality:  Gradual Timing:  Constant Progression:  Unchanged Chronicity:  New Context: not sun exposure   Relieved by:  Nothing Worsened by:  Nothing tried Ineffective treatments:  None tried Associated symptoms: no abdominal pain, no throat swelling and no tongue swelling     History reviewed. No pertinent past medical history. History reviewed. No pertinent past surgical history. Family History  Problem Relation Age of Onset  . Diabetes Mother   . Hypertension Mother   . Cancer Mother    History  Substance Use Topics  . Smoking status: Current Every Day Smoker    Types: Cigarettes  . Smokeless tobacco: Not on file  . Alcohol Use: Yes    Review of Systems  Gastrointestinal: Negative for abdominal pain.  Skin: Positive for rash.  All other systems reviewed and are negative.     Allergies  Review of patient's allergies indicates no known allergies.  Home Medications   Prior to Admission medications   Medication Sig Start Date End Date Taking? Authorizing Provider  ibuprofen (ADVIL,MOTRIN) 200 MG tablet Take 400 mg by mouth every 6 (six) hours as needed for mild pain.   Yes Historical Provider, MD  clotrimazole (LOTRIMIN) 1 % cream Apply to affected area 2 times daily 10/30/14   Yazmen Briones, MD  diphenhydrAMINE (BENADRYL) 25 MG tablet Take 2 tablets (50 mg total) by mouth every 4 (four) hours as needed for itching. Patient not taking: Reported on 10/30/2014 03/09/14   Wayland SalinasJohn Bednar, MD   predniSONE (DELTASONE) 20 MG tablet Take 3 tablets (60 mg total) by mouth daily. Patient not taking: Reported on 10/30/2014 01/27/14   Santiago GladHeather Laisure, PA-C  triamcinolone cream (KENALOG) 0.1 % Apply 1 application topically 4 (four) times daily as needed. Patient not taking: Reported on 10/30/2014 03/09/14   Wayland SalinasJohn Bednar, MD   BP 161/128 mmHg  Pulse 120  Temp(Src) 97.7 F (36.5 C) (Oral)  Resp 18  SpO2 98% Physical Exam  Constitutional: He is oriented to person, place, and time. He appears well-developed and well-nourished. No distress.  HENT:  Head: Normocephalic and atraumatic.  Mouth/Throat: Oropharynx is clear and moist.  Eyes: Conjunctivae are normal. Pupils are equal, round, and reactive to light.  Neck: Normal range of motion. Neck supple.  Cardiovascular: Normal rate and regular rhythm.   Pulmonary/Chest: Effort normal and breath sounds normal. No respiratory distress. He has no wheezes. He has no rales.  Abdominal: Soft. Bowel sounds are normal. There is no tenderness. There is no rebound and no guarding.  Genitourinary:  Several hair follicles that are inflamed but not infected on the bottom, tinea cruris.  Chaperone present  Musculoskeletal: Normal range of motion.  Neurological: He is alert and oriented to person, place, and time.  Skin: Skin is warm and dry.  Psychiatric: He has a normal mood and affect.    ED Course  Procedures (including critical care time) Labs Review Labs Reviewed - No data to display  Imaging Review No results found.   EKG Interpretation None  MDM   Final diagnoses:  Tinea cruris    Will treat for tinea cruris   Aurther Harlin, MD 10/30/14 [phone]

## 2014-10-30 NOTE — Discharge Instructions (Signed)
Jock Itch Jock itch is a fungal infection of the skin in the groin area. It is sometimes called "ringworm" even though it is not caused by a worm. A fungus is a type of germ that thrives in dark, damp places.  CAUSES  This infection may spread from:  A fungus infection elsewhere on the body (such as athlete's foot).  Sharing towels or clothing. This infection is more common in:  Hot, humid climates.  People who wear tight-fitting clothing or wet bathing suits for long periods of time.  Athletes.  Overweight people.  People with diabetes. SYMPTOMS  Jock itch causes the following symptoms:  Red, pink or brown rash in the groin. Rash may spread to the thighs, anus, and buttocks.  Itching. DIAGNOSIS  Your caregiver may make the diagnosis by looking at the rash. Sometimes a skin scraping will be sent to test for fungus. Testing can be done either by looking under the microscope or by doing a culture (test to try to grow the fungus). A culture can take up to 2 weeks to come back. TREATMENT  Jock itch may be treated with:  Skin cream or ointment to kill fungus.  Medicine by mouth to kill fungus.  Skin cream or ointment to calm the itching.  Compresses or medicated powders to dry the infected skin. HOME CARE INSTRUCTIONS   Be sure to treat the rash completely. Follow your caregiver's instructions. It can take a couple of weeks to treat. If you do not treat the infection long enough, the rash can come back.  Wear loose-fitting clothing.  Men should wear cotton boxer shorts.  Women should wear cotton underwear.  Avoid hot baths.  Dry the groin area well after bathing. SEEK MEDICAL CARE IF:   Your rash is worse.  Your rash is spreading.  Your rash returns after treatment is finished.  Your rash is not gone in 4 weeks. Fungal infections are slow to respond to treatment. Some redness may remain for several weeks after the fungus is gone. SEEK IMMEDIATE MEDICAL CARE  IF:  The area becomes red, warm, tender, and swollen.  You have a fever. Document Released: 06/24/2002 Document Revised: 09/26/2011 Document Reviewed: 05/23/2008 Exit[employer] Patient Information 2015 [employer]. This information is not intended to replace advice given to you by your health care provider. Make sure you discuss any questions you have with your health care provider.  

## 2015-04-10 ENCOUNTER — Encounter (HOSPITAL_COMMUNITY): Payer: Self-pay | Admitting: Emergency Medicine

## 2015-04-10 ENCOUNTER — Emergency Department (HOSPITAL_COMMUNITY)
Admission: EM | Admit: 2015-04-10 | Discharge: 2015-04-10 | Disposition: A | Discharge status: Home / Self Care | Payer: Self-pay | Attending: Emergency Medicine | Admitting: Emergency Medicine

## 2015-04-10 DIAGNOSIS — Y998 Other external cause status: Secondary | ICD-10-CM | POA: Insufficient documentation

## 2015-04-10 DIAGNOSIS — T148XXA Other injury of unspecified body region, initial encounter: Secondary | ICD-10-CM

## 2015-04-10 DIAGNOSIS — Z720 Tobacco use: Secondary | ICD-10-CM | POA: Insufficient documentation

## 2015-04-10 DIAGNOSIS — Y9289 Other specified places as the place of occurrence of the external cause: Secondary | ICD-10-CM | POA: Insufficient documentation

## 2015-04-10 DIAGNOSIS — S299XXA Unspecified injury of thorax, initial encounter: Secondary | ICD-10-CM | POA: Insufficient documentation

## 2015-04-10 DIAGNOSIS — Y9389 Activity, other specified: Secondary | ICD-10-CM | POA: Insufficient documentation

## 2015-04-10 DIAGNOSIS — Z79899 Other long term (current) drug therapy: Secondary | ICD-10-CM | POA: Insufficient documentation

## 2015-04-10 DIAGNOSIS — X58XXXA Exposure to other specified factors, initial encounter: Secondary | ICD-10-CM | POA: Insufficient documentation

## 2015-04-10 DIAGNOSIS — S161XXA Strain of muscle, fascia and tendon at neck level, initial encounter: Secondary | ICD-10-CM | POA: Insufficient documentation

## 2015-04-10 MED ORDER — CYCLOBENZAPRINE HCL 5 MG PO TABS: 5.0000 mg | ORAL_TABLET | Freq: Three times a day (TID) | ORAL | Status: DC | PRN

## 2015-04-10 MED ORDER — IBUPROFEN 800 MG PO TABS
800.0000 mg | ORAL_TABLET | Freq: Once | ORAL | Status: AC
  Administered 2015-04-10: 800 mg via ORAL
  Filled 2015-04-10: qty 1

## 2015-04-10 NOTE — Discharge Instructions (Signed)

## 2015-04-10 NOTE — ED Provider Notes (Signed)
CSN: [id number]     Arrival date & time 04/10/15  1420 History    This chart was scribed for non-physician practitioner Teressa Lower, NP working with Pricilla Loveless, MD by Murriel Hopper, ED Scribe. This patient was seen in room WTR9/WTR9 and the patient's care was started at 3:40 PM.  Chief Complaint  Patient presents with  . Back Pain    denies trauma  . Neck Pain      Patient is a 29 y.o. male presenting with back pain and neck pain. The history is provided by the patient. No language interpreter was used.  Back Pain Associated symptoms: no numbness and no weakness   Neck Pain Associated symptoms: no numbness and no weakness    HPI Comments: [name] is a 29 y.o. male who presents to the Emergency Department complaining of constant, worsening right-sided upper back and neck pain that has been present since yesterday. Pt states he thinks he could have possibly injured it while helping his friend put up upholstery yesterday but cannot pinpoint a specific incident. Pt states his pain worsens when he lifts anything with his right arm and worsens with movements of his neck to the sides. Pt denies using any medications or treatment for the pain. Pt denies numbness, weakness, or allergies.    History reviewed. No pertinent past medical history. No past surgical history on file. Family History  Problem Relation Age of Onset  . Diabetes Mother   . Hypertension Mother   . Cancer Mother   . Cancer Father   . Diabetes Father   . Hypertension Father    Social History  Substance Use Topics  . Smoking status: Current Every Day Smoker    Types: Cigarettes  . Smokeless tobacco: None  . Alcohol Use: No    Review of Systems  Musculoskeletal: Positive for myalgias, back pain, neck pain and neck stiffness.  Neurological: Negative for weakness and numbness.  All other systems reviewed and are negative.     Allergies  Review of patient's allergies indicates no known  allergies.  Home Medications   Prior to Admission medications   Medication Sig Start Date End Date Taking? Authorizing Provider  clotrimazole (LOTRIMIN) 1 % cream Apply to affected area 2 times daily 10/30/14   April Palumbo, MD  diphenhydrAMINE (BENADRYL) 25 MG tablet Take 2 tablets (50 mg total) by mouth every 4 (four) hours as needed for itching. Patient not taking: Reported on 10/30/2014 03/09/14   Wayland Salinas, MD  ibuprofen (ADVIL,MOTRIN) 200 MG tablet Take 400 mg by mouth every 6 (six) hours as needed for mild pain.    Historical Provider, MD  predniSONE (DELTASONE) 20 MG tablet Take 3 tablets (60 mg total) by mouth daily. Patient not taking: Reported on 10/30/2014 01/27/14   Santiago Glad, PA-C  triamcinolone cream (KENALOG) 0.1 % Apply 1 application topically 4 (four) times daily as needed. Patient not taking: Reported on 10/30/2014 03/09/14   Wayland Salinas, MD   BP 122/77 mmHg  Pulse 89  Temp(Src) 98.4 F (36.9 C) (Oral)  Resp 16  Wt 150 lb (68.04 kg)  SpO2 99% Physical Exam  Constitutional: He is oriented to person, place, and time. He appears well-developed and well-nourished.  HENT:  Head: Normocephalic and atraumatic.  Cardiovascular: Normal rate.   Pulmonary/Chest: Effort normal.  Abdominal: He exhibits no distension.  Musculoskeletal: Normal range of motion.  Right cervical paraspinal tenderness. Equal strength bilaterally  Neurological: He is alert and oriented to person, place, and time.  Skin: Skin is warm and dry.  Psychiatric: He has a normal mood and affect.  Nursing note and vitals reviewed.   ED Course  Procedures (including critical care time)  DIAGNOSTIC STUDIES: Oxygen Saturation is 99% on room air, normal by my interpretation.    COORDINATION OF CARE: 3:46 PM Discussed treatment plan with pt at bedside and pt agreed to plan.   Labs Review Labs Reviewed - No data to display  Imaging Review No results found. I have personally reviewed and  evaluated these images and lab results as part of my medical decision-making.   EKG Interpretation None      MDM   Final diagnoses:  Muscle strain    Pt is neurologically intact. Will treat with flexeril.   I personally performed the services described in this documentation, which was scribed in my presence. The recorded information has been reviewed and is accurate.    Teressa Lower, NP 04/10/15 1552  Pricilla Loveless, MD 04/12/15 [phone]

## 2015-04-10 NOTE — ED Notes (Signed)
1530  Pt escorted to treatment room

## 2015-04-10 NOTE — ED Notes (Signed)
Pt reports pain neck and back since yesterday. Does not remember when is started, did not attempt to medicate. Did not attempt heat or ice to area. Denies trauma

## 2015-06-24 ENCOUNTER — Encounter (HOSPITAL_COMMUNITY): Payer: Self-pay | Admitting: Emergency Medicine

## 2015-06-24 ENCOUNTER — Emergency Department (HOSPITAL_COMMUNITY)
Admission: EM | Admit: 2015-06-24 | Discharge: 2015-06-24 | Disposition: A | Discharge status: Home / Self Care | Payer: Self-pay | Attending: Emergency Medicine | Admitting: Emergency Medicine

## 2015-06-24 DIAGNOSIS — Z590 Homelessness: Secondary | ICD-10-CM | POA: Insufficient documentation

## 2015-06-24 DIAGNOSIS — R609 Edema, unspecified: Secondary | ICD-10-CM

## 2015-06-24 DIAGNOSIS — R600 Localized edema: Secondary | ICD-10-CM | POA: Insufficient documentation

## 2015-06-24 DIAGNOSIS — F17210 Nicotine dependence, cigarettes, uncomplicated: Secondary | ICD-10-CM | POA: Insufficient documentation

## 2015-06-24 LAB — CBC WITH DIFFERENTIAL/PLATELET
BASOS ABS: 0 10*3/uL (ref 0.0–0.1)
Basophils Relative: 1 %
EOS PCT: 6 %
Eosinophils Absolute: 0.4 10*3/uL (ref 0.0–0.7)
HCT: 42.8 % (ref 39.0–52.0)
Hemoglobin: 14.6 g/dL (ref 13.0–17.0)
LYMPHS PCT: 56 %
Lymphs Abs: 3.2 10*3/uL (ref 0.7–4.0)
MCH: 31.3 pg (ref 26.0–34.0)
MCHC: 34.1 g/dL (ref 30.0–36.0)
MCV: 91.8 fL (ref 78.0–100.0)
Monocytes Absolute: 0.6 10*3/uL (ref 0.1–1.0)
Monocytes Relative: 10 %
NEUTROS PCT: 27 %
Neutro Abs: 1.5 10*3/uL — ABNORMAL LOW (ref 1.7–7.7)
PLATELETS: 258 10*3/uL (ref 150–400)
RBC: 4.66 MIL/uL (ref 4.22–5.81)
RDW: 13.7 % (ref 11.5–15.5)
WBC: 5.7 10*3/uL (ref 4.0–10.5)

## 2015-06-24 LAB — COMPREHENSIVE METABOLIC PANEL
ALT: 19 U/L (ref 17–63)
AST: 18 U/L (ref 15–41)
Albumin: 4.1 g/dL (ref 3.5–5.0)
Alkaline Phosphatase: 57 U/L (ref 38–126)
Anion gap: 6 (ref 5–15)
BUN: 14 mg/dL (ref 6–20)
CHLORIDE: 110 mmol/L (ref 101–111)
CO2: 25 mmol/L (ref 22–32)
Calcium: 9.1 mg/dL (ref 8.9–10.3)
Creatinine, Ser: 0.72 mg/dL (ref 0.61–1.24)
GFR calc Af Amer: >60 mL/min (ref >60)
GFR calc non Af Amer: >60 mL/min (ref >60)
Glucose, Bld: 94 mg/dL (ref 65–99)
Potassium: 3.7 mmol/L (ref 3.5–5.1)
Sodium: 141 mmol/L (ref 135–145)
Total Bilirubin: 0.6 mg/dL (ref 0.3–1.2)
Total Protein: 6.8 g/dL (ref 6.5–8.1)

## 2015-06-24 MED ORDER — FUROSEMIDE 40 MG PO TABS
20.0000 mg | ORAL_TABLET | Freq: Every day | ORAL | Status: DC
  Administered 2015-06-24: 20 mg via ORAL
  Filled 2015-06-24: qty 1

## 2015-06-24 MED ORDER — FUROSEMIDE 20 MG PO TABS: ORAL_TABLET | ORAL | Status: DC

## 2015-06-24 NOTE — Progress Notes (Signed)
CM spoke with pt who [place]cp.  CM discussed and provided written information for uninsured accepting pcps, discussed the importance of pcp vs EDP services for f/u care, www.needymeds.org, www.goodrx.com, discounted pharmacies and other [place] resources such as [facility] , [facility], affordable care act, financial assistance, uninsured dental services, [place] med assist, DSS and  health department  Reviewed resources for [place] uninsured accepting pcps like [facility], [facility] medicine [facility] [address], [facility], [facility], local urgent care centers, [facility], [facility], general medical clinics, [facility] services of the [place], [facility] urgent care plus others, medication resources, [facility] out patient pharmacies and housing Pt voiced understanding and appreciation of resources provided   Provided [facility] contact information Pt agreed to a referral Cm completed referral Pt was noted in EPIC to have been seen by Kenny[facility]Stacy [facility] in July 2015  Pt to be contact by [facility] clinical liason  Entered in d/c  Please use the resources provided to you in emergency room by case manager to assist with doctor for follow up Schedule an appointment as soon as possible for a visit As needed These [place] uninsured resources provide possible primary care providers, resources for discounted medications, housing, dental resources, affordable care act information, plus other resources for [place]  Pt interested in housing/shelter/homeless ED SW sent a text  CM noted pt with a green suitcase, 2 coats and other belongings on chairs in room Pt clean, not untidy and very pleasant Cm mentioned [facility], [facility] Pt did not respond but noted to doze at intervals during cm assessment Cm discussed [facility] not being able to assist with certain d.c meds Pt voiced understanding  Updated ED RN

## 2015-06-24 NOTE — ED Provider Notes (Signed)
CSN: [id number]     Arrival date & time 06/24/15  [id number] History   First MD Initiated Contact with Patient 06/24/15 1145     Chief Complaint  Patient presents with  . Joint Swelling     HPI  She presents today relation swollen feet. He is homeless. He is on his feet most of the time". States it really seems well. He states typically when he is off his feet for any length of time this will improve. Her he recently became homeless and has been on his feet a great deal more. Denies shortness of breath. Urinating normally. Not taking medications. No history of cardiac, pulmonary, or renal disease.  History reviewed. No pertinent past medical history. History reviewed. No pertinent past surgical history. Family History  Problem Relation Age of Onset  . Diabetes Mother   . Hypertension Mother   . Cancer Mother   . Cancer Father   . Diabetes Father   . Hypertension Father    Social History  Substance Use Topics  . Smoking status: Current Every Day Smoker    Types: Cigarettes  . Smokeless tobacco: None  . Alcohol Use: No    Review of Systems  Constitutional: Negative for fever, chills, diaphoresis, appetite change and fatigue.  HENT: Negative for mouth sores, sore throat and trouble swallowing.   Eyes: Negative for visual disturbance.  Respiratory: Negative for cough, chest tightness, shortness of breath and wheezing.   Cardiovascular: Positive for leg swelling. Negative for chest pain.  Gastrointestinal: Negative for nausea, vomiting, abdominal pain, diarrhea and abdominal distention.  Endocrine: Negative for polydipsia, polyphagia and polyuria.  Genitourinary: Negative for dysuria, frequency and hematuria.  Musculoskeletal: Negative for gait problem.  Skin: Negative for color change, pallor and rash.  Neurological: Negative for dizziness, syncope, light-headedness and headaches.  Hematological: Does not bruise/bleed easily.  Psychiatric/Behavioral: Negative for behavioral problems  and confusion.      Allergies  Review of patient's allergies indicates no known allergies.  Home Medications   Prior to Admission medications   Medication Sig Start Date End Date Taking? Authorizing Provider  ibuprofen (ADVIL,MOTRIN) 200 MG tablet Take 400 mg by mouth every 6 (six) hours as needed for mild pain.   Yes Historical Provider, MD  clotrimazole (LOTRIMIN) 1 % cream Apply to affected area 2 times daily Patient not taking: Reported on 06/24/2015 10/30/14   April Palumbo, MD  cyclobenzaprine (FLEXERIL) 5 MG tablet Take 1 tablet (5 mg total) by mouth 3 (three) times daily as needed for muscle spasms. Patient not taking: Reported on 06/24/2015 04/10/15   Teressa LowerVrinda Pickering, NP  furosemide (LASIX) 20 MG tablet 1 by mouth every morning as needed for swelling. 06/24/15   Rolland PorterMark Tremel Setters, MD   BP 122/71 mmHg  Pulse 71  Temp(Src) 98.1 F (36.7 C) (Oral)  Resp 16  Ht 6' (1.829 m)  Wt 190 lb 2 oz (86.24 kg)  BMI 25.78 kg/m2  SpO2 100% Physical Exam  Constitutional: He is oriented to person, place, and time. He appears well-developed and well-nourished. No distress.  HENT:  Head: Normocephalic.  Eyes: Conjunctivae are normal. Pupils are equal, round, and reactive to light. No scleral icterus.  Neck: Normal range of motion. Neck supple. No thyromegaly present.  Cardiovascular: Normal rate and regular rhythm.  Exam reveals no gallop and no friction rub.   No murmur heard. Pulmonary/Chest: Effort normal and breath sounds normal. No respiratory distress. He has no wheezes. He has no rales.  Abdominal: Soft. Bowel sounds  are normal. He exhibits no distension. There is no tenderness. There is no rebound.  Musculoskeletal: Normal range of motion.  Neurological: He is alert and oriented to person, place, and time.  Skin: Skin is warm and dry. No rash noted.  2+ symmetric bilateral lower extremity edema. Not erythematous or warm. No cording or swelling. Not painful.  Psychiatric: He has a normal  mood and affect. His behavior is normal.    ED Course  Procedures (including critical care time) Labs Review Labs Reviewed  CBC WITH DIFFERENTIAL/PLATELET - Abnormal; Notable for the following:    Neutro Abs 1.5 (*)    All other components within normal limits  COMPREHENSIVE METABOLIC PANEL    Imaging Review No results found. I have personally reviewed and evaluated these images and lab results as part of my medical decision-making.   EKG Interpretation None      MDM   Final diagnoses:  Dependent edema    Patient with dependent edema. Not apparently secondary to cardiac or renal abnormalities. Appropriate for symptom treatment with when necessary diuretics. Seen by Child psychotherapist and arrangements made for follow-up and medication assistance.    Rolland Porter, MD 07/01/15 [phone]

## 2015-06-24 NOTE — Discharge Instructions (Signed)

## 2015-06-24 NOTE — Progress Notes (Addendum)
CSW met with patient at bedside. There was no family present. Per note, patient presents to [facility] due to bilat ankle swelling.  Patient confirms that he is homeless. CSW provided patient with resources for shelter and food pantries. Patient informed CSW that he has been homeless for the past 10 years. He states that recently he has been staying at a laundry mat for shelter. Patient states that he has been at the laundry mat for the past week. Also, patient states that he can sometimes stay with friends.  Patient states that his mother is his primary support. He states that his mom lives in [place].   CSW offered encouragement to patient. CSW spoke with the patient about going to the [facility], and spoke with him about the services that they could provide. CSW encouraged the patient to go. Patient states that he is familiar with the [facility]. He agrees to go to the [facility], and states he will go there upon discharge. CSW provided the patient with a bus pass. CSW made nurse aware.  Patient states that he does not have any questions for CSW.  Mom/ Trac Deroos [phone]  Willette Brace [phone] ED CSW 06/24/2015 1:58 PM

## 2015-06-24 NOTE — ED Notes (Signed)
Pt c/o bilat ankle swelling that pt states happens occasionally. Pt never been seen for it before.

## 2015-07-12 ENCOUNTER — Encounter (HOSPITAL_COMMUNITY): Payer: Self-pay

## 2015-07-12 ENCOUNTER — Emergency Department (HOSPITAL_COMMUNITY)
Admission: EM | Admit: 2015-07-12 | Discharge: 2015-07-12 | Disposition: A | Discharge status: Home / Self Care | Payer: Self-pay | Attending: Emergency Medicine | Admitting: Emergency Medicine

## 2015-07-12 ENCOUNTER — Emergency Department (HOSPITAL_COMMUNITY): Payer: Self-pay

## 2015-07-12 DIAGNOSIS — Z87891 Personal history of nicotine dependence: Secondary | ICD-10-CM | POA: Insufficient documentation

## 2015-07-12 DIAGNOSIS — M79671 Pain in right foot: Secondary | ICD-10-CM | POA: Insufficient documentation

## 2015-07-12 NOTE — Discharge Instructions (Signed)
Do not hesitate to return to the emergency room for any new, worsening or concerning symptoms. ° °Please obtain primary care using resource guide below. Let them know that you were seen in the emergency room and that they will need to obtain records for further outpatient management. ° ° ° °Emergency Department Resource Guide °1) Find a Doctor and Pay Out of Pocket °Although you won't have to find out who is covered by your insurance plan, it is a good idea to ask around and get recommendations. You will then need to call the office and see if the doctor you have chosen will accept you as a new patient and what types of options they offer for patients who are self-pay. Some doctors offer discounts or will set up payment plans for their patients who do not have insurance, but you will need to ask so you aren't surprised when you get to your appointment. ° °2) Contact Your Local Health Department °Not all health departments have doctors that can see patients for sick visits, but many do, so it is worth a call to see if yours does. If you don't know where your local health department is, you can check in your phone book. The CDC also has a tool to help you locate your state's health department, and many state websites also have listings of all of their local health departments. ° °3) Find a Walk-in Clinic °If your illness is not likely to be very severe or complicated, you may want to try a walk in clinic. These are popping up all over the country in pharmacies, drugstores, and shopping centers. They're usually staffed by nurse practitioners or physician assistants that have been trained to treat common illnesses and complaints. They're usually fairly quick and inexpensive. However, if you have serious medical issues or chronic medical problems, these are probably not your best option. ° °No Primary Care Doctor: °- Call [facility] at  832-8000 - they can help you locate a primary care doctor that  accepts your  insurance, provides certain services, etc. °- Physician Referral Service- 1-800-533-3463 ° °Chronic Pain Problems: °Organization         Address  Phone   Notes  ° [facility]  (336) 297-2271 Patients need to be referred by their primary care doctor.  ° °Medication Assistance: °Organization         Address  Phone   Notes  °[place] [facility] [address] °[place], [place] [address] (336) 641-8030 --Must be a resident of [place] °-- Must have NO insurance coverage whatsoever (no Medicaid/ Medicare, etc.) °-- The pt. MUST have a primary care doctor that directs their care regularly and follows them in the community °  °[facility]  (866) 331-1348   °[employer]  (888) 892-1162   ° °Agencies that provide inexpensive medical care: °Organization         Address  Phone   Notes  °[facility]  (336) 832-8035   °[facility]    (336) 832-7272   °[facility] [address] °[place], [place] [address] (336) 832-4777   °[facility] [address], °[place] (336) 271-4999   °[facility]    (336) 373-0678   °[facility]    (336) 272-1050   °[facility] ° [address], [place] Phone:  (336) 832-4444, Fax:  (336) 832-4440 Hours of Operation:  9 am -   6 pm, M-F.  Also accepts Medicaid/Medicare and self-pay.  ° [facility] ° [address], [place] Phone: (336) 832-3150, Fax: (336) 832-3151. Hours of Operation:  8:30 am - 5:30 pm, M-F.  Also accepts Medicaid and self-pay.  °[facility] [place] [address], [place] Phone: (336) 878-6027   °[facility] [address], [place], [place] (336)723-1848, Ext. 123 Mondays & Thursdays: 7-9 AM.  First 15 patients are seen on a first come, first serve basis. °  ° °Medicaid-accepting [place] Providers: ° °Organization          Address  Phone   Notes  °[facility] [address], [place] (336) 641-2100 Also accepts self-pay patients.  °[facility] [address], [place] ° (336) 856-9996   °[facility] [address], [place] (336) 288-8857   °[facility] [address] [place] [address], [place] (336) 299-7000   °Veita Bland [address], [place]  ° (336) 373-1557 Only accepts [place] Access Medicaid patients after they have their name applied to their card.  ° °Self-Pay (no insurance) in [place]: ° °Organization         Address  Phone   Notes  °Sickle Cell Patients, [facility] [address], [place] (336) 832-1970   °[facility] [address], [place] (336) 832-4400   °[facility] [place] ° [address], [place] (336) 992-4800   °[facility]/Dr. Osei-Bonsu ° [address] [place] [address], [place] or [address], [place] (336) 841-8500 Phone number for both [place] and [place] locations is the same.  °[facility] [address], [place] (336) 299-0000   °[facility] [address] [place] [address], [place] or [address] (336) 852-7530 °(336) 878-2260   °[facility] [address], [place] (336) 350-1642, phone; (336) 294-5005, fax Sees patients 1st and 3rd Saturday of every month.  Must not qualify for public or private insurance (i.e. Medicaid, Medicare, [place] Health Choice, Veterans' Benefits) • Household income should be no more than 200% of the poverty level •The clinic cannot treat you if you are pregnant or think you are pregnant • Sexually transmitted diseases are not treated at the clinic.  ° ° °Dental Care: °Organization         Address  Phone  Notes  °[facility] [address],   (336) 641-6152 Accepts children up to age 21 who are enrolled in Medicaid or [place] Health Choice; pregnant women with a Medicaid card; and children who have applied for Medicaid or [place] Health Choice, but were declined, whose parents can pay a reduced fee at time of service.  °[facility] [place]  [address], [place] (336) 641-7733 Accepts children up to age 21 who are enrolled in Medicaid or [place] Health Choice; pregnant women with a Medicaid card; and children who have applied for Medicaid or [place] Health Choice, but were declined, whose parents can pay a reduced fee at time of service.  °[facility] ° [address],  (336) 641-4533 Patients are seen by appointment only. Walk-ins are not accepted. [facility] will see patients 18 years of age and   older. °Monday - Tuesday (8am-5pm) °Most Wednesdays (8:30-5pm) °$30 per visit, cash only  °[facility] ° [address], [place] (336) 641-4533 Patients are seen by appointment only. Walk-ins are not accepted. [facility] will see patients 18 years of age and older. °One Wednesday Evening (Monthly: Volunteer Based).  $30 per visit, cash only  °[facility]  (919) 537-3737 for adults; Children under age 4, call Graduate Pediatric Dentistry at (919) [phone]. Children aged 4-14, please call (919) 537-3737 to request a pediatric application. ° Dental services are provided in all areas of dental care including fillings, crowns and bridges, complete and partial dentures, implants, gum treatment, root canals, and extractions. Preventive care is also provided. Treatment is provided to both adults and children. °Patients are selected via a lottery and there is often a waiting list. °  °[facility] [address], °[place] ° (336) 763-8833 [email or web address] °  °[facility] [address], [place], [place]  (336)723-1848, Ext. 123 Second and Fourth Thursday of each month, opens at 6:30 AM; Clinic ends at 9 AM.  Patients are seen on a first-come first-served basis, and a limited number are seen during each clinic.  ° °[facility] ° [address], [place], [place] (336) 723-7904   Eligibility Requirements °You must have lived in [place], [place], or [place] counties for at least the last three months. °  You cannot be eligible for state or federal sponsored healthcare insurance, including Veterans Administration, Medicaid, or Medicare. °  You generally cannot be eligible for healthcare insurance through your employer.  °  How to apply: °Eligibility screenings are held every Tuesday and Wednesday afternoon from 1:00 pm until 4:00 pm. You do not need an appointment for the interview!  °[facility] [address], [place], [place] 336-631-2330   °[facility]  336-342-8273   °[facility]  336-703-3100   °[facility]  336-570-6415   ° °Behavioral Health Resources in the Community: °Intensive Outpatient Programs °Organization         Address  Phone  Notes  °[facility] [address], [place], [place] 336-878-6098   °[facility] [address], [place], [place] 336-832-9800   °[facility] [address], [place], [place] ° 336-882-2125   °[facility] [address],  °[place], [place] 1-800-853-5163 or 336-641-4981   °Substance Abuse Resources °Organization         Address  Phone  Notes  °[facility]  336-882-2125   °[facility]  336-784-9470   °[facility]  336-285-9073   °[facility]  336-845-3988   °Residential & Outpatient Substance Abuse Program  1-800-659-3381   °Psychological Services °Organization         Address  Phone  Notes  °[facility]  336- 832-9600   °[facility]  336- 378-7881    °[facility] [address], [place] 1-800-853-5163 or 336-641-4981   ° °Mobile Crisis Teams °Organization         Address  Phone  Notes  °[facility], Mobile Crisis Care Unit  1-877-626-1772   °Assertive °[facility] ° [address] [place], [place] 336-834-9664   °Sharon DeEsch [address] °[place]  336-554-5454   ° °Self-Help/Support Groups °Organization         Address    Phone             Notes  °[facility] [place] - variety of support groups  336- 373-1402 Call for more information  °Narcotics Anonymous (NA), [facility] [address], °[place]  2 meetings at this location  ° °Residential Treatment Programs °Organization         Address  Phone  Notes  °[facility] [address],    °[place]  1-866-801-8205   °[facility] ° [address], [place], [place] 704-293-8524   °[facility] [address], [place] 336-845-3988 Admissions: 8am-3pm M-F  °[facility] [address]    °[place], [place] 336-841-1104   °[facility] [address], [place], [place] 336-379-7146   °[facility] [address]  °[place], [place] 336-285-9073   °[facility] Intensive Outpatient [address], [place], [place] 336-852-3033   °[facility] [address]  °[place], [place] 1-877-615-2722 or 336-784-9470   °[facility] [address] [place], [place] 336-227-7417 Accepts Medicaid  °[facility] [address]  °[place] 1-800-659-3381 Substance Abuse/Addiction Treatment  ° °[place] Behavioral Health Resources °Organization         Address  Phone  Notes  °[facility]  (888) 581-9988   °Julie Brannon, PhD [address] [place], [place]   (336) 349-5553 or (336) 951-0000   °[facility]   [address] °[place], [place] (336) 349-4454   °[facility] [address], [place], [place] (336) 342-8316 Insurance/Medicaid/sponsorship through [facility]  °[facility] [address]                                    [place], [place] (336) 342-8316 Therapy/tele-psych/case  °[facility] [address]  ° [place], [place] (336) 349-2233    °Dr. Arfeen  (336) 349-4544   °[facility] [place]  [employer] [place] [facility] 1) [address], [place] °2) [address], [place] °3)  [address], [place] (336) 349-3220 °(336) 342-7768 ° °(336) 342-8140   °[place] [facility] (336) 342-1394 or (336) 342-3537 (After Hours)    ° ° ° °

## 2015-07-12 NOTE — ED Notes (Signed)
Pt complaining of pain on the outside of his R foot. States that he has the "worst flat feet in the world."

## 2015-07-12 NOTE — ED Provider Notes (Signed)
CSN: [id number]     Arrival date & time 07/12/15  1657 History  By signing my name below, I, [facility] Washington, attest that this documentation has been prepared under the direction and in the presence of [employer] Corporation[employer]ole Shiasia Porro, PA-C. Electronically Signed: Randell PatientMarrissa Washington, ED Scribe. 07/12/2015. 6:14 PM.   Chief Complaint  Patient presents with  . Foot Pain    R foot   The history is provided by the patient. No language interpreter was used.   HPI Comments: [name] is a [age] y.o. male who presents to the Emergency Department complaining of mild, unchanging lateral right foot pain, more towards the ankle, onset this morning. Patient denies recent injury but notes frequent ambulation and that "he has the worst flat feet in the world." He endorses altered ambulation secondary to pain. He has not taken any medications or attempted any treatments.  History reviewed. No pertinent past medical history. History reviewed. No pertinent past surgical history. Family History  Problem Relation Age of Onset  . Diabetes Mother   . Hypertension Mother   . Cancer Mother   . Cancer Father   . Diabetes Father   . Hypertension Father    Social History  Substance Use Topics  . Smoking status: Former Smoker    Types: Cigarettes  . Smokeless tobacco: None  . Alcohol Use: No    Review of Systems A complete 10 system review of systems was obtained and all systems are negative except as noted in the HPI and PMH.    Allergies  Review of patient's allergies indicates no known allergies.  Home Medications   Prior to Admission medications   Medication Sig Start Date End Date Taking? Authorizing Provider  clotrimazole (LOTRIMIN) 1 % cream Apply to affected area 2 times daily Patient not taking: Reported on 06/24/2015 10/30/14   April Palumbo, MD  cyclobenzaprine (FLEXERIL) 5 MG tablet Take 1 tablet (5 mg total) by mouth 3 (three) times daily as needed for muscle spasms. Patient not taking: Reported on  06/24/2015 04/10/15   Teressa LowerVrinda Pickering, NP  furosemide (LASIX) 20 MG tablet 1 by mouth every morning as needed for swelling. 06/24/15   Rolland PorterMark James, MD  ibuprofen (ADVIL,MOTRIN) 200 MG tablet Take 400 mg by mouth every 6 (six) hours as needed for mild pain.    Historical Provider, MD   BP 147/127 mmHg  Pulse 107  Temp(Src) 98 F (36.7 C) (Oral)  Resp 18  SpO2 100% Physical Exam  Constitutional: He is oriented to person, place, and time. He appears well-developed and well-nourished. No distress.  HENT:  Head: Normocephalic.  Eyes: Conjunctivae and EOM are normal.  Cardiovascular: Normal rate.   Pulmonary/Chest: Effort normal and breath sounds normal. No stridor.  Abdominal: Soft.  Musculoskeletal: Normal range of motion. He exhibits tenderness. He exhibits no edema.       Feet:  To palpation along the far lateral side of the right foot. No overlying skin changes or edema.  Neurological: He is alert and oriented to person, place, and time.  Psychiatric: He has a normal mood and affect.  Nursing note and vitals reviewed.   ED Course  Procedures   DIAGNOSTIC STUDIES: Oxygen Saturation is 100% on RA, normal by my interpretation.    COORDINATION OF CARE: 5:29 PM Discussed treatment plan with pt at bedside and pt agreed to plan.  Labs Review Labs Reviewed - No data to display  Imaging Review Dg Foot Complete Right  07/12/2015  CLINICAL DATA:  Right lateral  foot pain EXAM: RIGHT FOOT COMPLETE - 3+ VIEW COMPARISON:  None. FINDINGS: Normal alignment without acute osseous finding or fracture. No joint abnormality or significant arthropathy. Unremarkable soft tissues. Pes planus deformity noted on the lateral view. IMPRESSION: No acute osseous finding Electronically Signed   By: Judie Petit.  Shick M.D.   On: 07/12/2015 18:08   I have personally reviewed and evaluated these images and lab results as part of my medical decision-making.    EKG Interpretation None      MDM   Final diagnoses:   Foot pain, right    Filed Vitals:   07/12/15 1704 07/12/15 1831  BP: 147/127 159/124  Pulse: 107 96  Temp: 98 F (36.7 C)   TempSrc: Oral   Resp: 18 16  SpO2: 100% 100%    [name] is 29 y.o. male presenting with right-sided lateral foot pain, there's been no trauma, states he's been walking more than normal and these flat-footed. There is no skin changes, no signs of infection. X-ray obtained at patient's specific request is negative. Patient also requests a copy of the film which was given to him on CD. Patient declines crutches or pain medication.   Evaluation does not show pathology that would require ongoing emergent intervention or inpatient treatment. Pt is hemodynamically stable and mentating appropriately. Discussed findings and plan with patient/guardian, who agrees with care plan. All questions answered. Return precautions discussed and outpatient follow up given.       Wynetta Emery, PA-C 07/12/15 1904  Arby Barrette, MD 07/25/15 [phone]

## 2015-11-07 ENCOUNTER — Emergency Department (HOSPITAL_COMMUNITY)
Admission: EM | Admit: 2015-11-07 | Discharge: 2015-11-07 | Disposition: A | Discharge status: Home / Self Care | Payer: Self-pay | Attending: Emergency Medicine | Admitting: Emergency Medicine

## 2015-11-07 ENCOUNTER — Encounter (HOSPITAL_COMMUNITY): Payer: Self-pay | Admitting: Emergency Medicine

## 2015-11-07 DIAGNOSIS — Y998 Other external cause status: Secondary | ICD-10-CM | POA: Insufficient documentation

## 2015-11-07 DIAGNOSIS — S70361A Insect bite (nonvenomous), right thigh, initial encounter: Secondary | ICD-10-CM | POA: Insufficient documentation

## 2015-11-07 DIAGNOSIS — W57XXXA Bitten or stung by nonvenomous insect and other nonvenomous arthropods, initial encounter: Secondary | ICD-10-CM

## 2015-11-07 DIAGNOSIS — Y9389 Activity, other specified: Secondary | ICD-10-CM | POA: Insufficient documentation

## 2015-11-07 DIAGNOSIS — Y92009 Unspecified place in unspecified non-institutional (private) residence as the place of occurrence of the external cause: Secondary | ICD-10-CM | POA: Insufficient documentation

## 2015-11-07 DIAGNOSIS — Z87891 Personal history of nicotine dependence: Secondary | ICD-10-CM | POA: Insufficient documentation

## 2015-11-07 MED ORDER — HYDROCORTISONE 1 % EX CREA
TOPICAL_CREAM | Freq: Three times a day (TID) | CUTANEOUS | Status: DC
  Administered 2015-11-07: 15:00:00 via TOPICAL
  Filled 2015-11-07: qty 28

## 2015-11-07 MED ORDER — DIPHENHYDRAMINE HCL 25 MG PO TABS: 25.0000 mg | ORAL_TABLET | Freq: Four times a day (QID) | ORAL | Status: DC | PRN

## 2015-11-07 MED ORDER — DIPHENHYDRAMINE HCL 25 MG PO CAPS
50.0000 mg | ORAL_CAPSULE | Freq: Once | ORAL | Status: AC
  Administered 2015-11-07: 50 mg via ORAL
  Filled 2015-11-07: qty 2

## 2015-11-07 NOTE — Discharge Instructions (Signed)
Insect Bite Mosquitoes, flies, fleas, bedbugs, and many other insects can bite. Insect bites are different from insect stings. A sting is when poison (venom) is injected into the skin. Insect bites can cause pain or itching for a few days, but they are usually not serious. Some insects can spread diseases to people through a bite. SYMPTOMS  Symptoms of an insect bite include:  Itching or pain in the bite area.  Redness and swelling in the bite area.  An open wound (skin ulcer). In many cases, symptoms last for 2-4 days.  DIAGNOSIS  This condition is usually diagnosed based on symptoms and a physical exam. TREATMENT  Treatment is usually not needed for an insect bite. Symptoms often go away on their own. Your health care provider may recommend creams or lotions to help reduce itching. Antibiotic medicines may be prescribed if the bite becomes infected. A tetanus shot may be given in some cases. If you develop an allergic reaction to an insect bite, your health care provider will prescribe medicines to treat the reaction (antihistamines). This is rare. HOME CARE INSTRUCTIONS  Do not scratch the bite area.  Keep the bite area clean and dry. Wash the bite area daily with soap and water as told by your health care provider.  If directed, applyice to the bite area.  Put ice in a plastic bag.  Place a towel between your skin and the bag.  Leave the ice on for 20 minutes, 2-3 times per day.  To help reduce itching and swelling, try applying a baking soda paste, cortisone cream, or calamine lotion to the bite area as told by your health care provider.  Apply or take over-the-counter and prescription medicines only as told by your health care provider.  If you were prescribed an antibiotic medicine, use it as told by your health care provider. Do not stop using the antibiotic even if your condition improves.  Keep all follow-up visits as told by your health care provider. This is  important. PREVENTION   Use insect repellent. The best insect repellents contain:  DEET, picaridin, oil of lemon eucalyptus (OLE), or IR3535.  Higher amounts of an active ingredient.  When you are outdoors, wear clothing that covers your arms and legs.  Avoid opening windows that do not have window screens. SEEK MEDICAL CARE IF:  You have increased redness, swelling, or pain in the bite area.  You have a fever. SEEK IMMEDIATE MEDICAL CARE IF:   You have joint pain.   You have fluid, blood, or pus coming from the bite area.  You have a headache or neck pain.  You have unusual weakness.  You have a rash.  You have chest pain or shortness of breath.  You have abdominal pain, nausea, or vomiting.  You feel unusually tired or sleepy.   This information is not intended to replace advice given to you by your health care provider. Make sure you discuss any questions you have with your health care provider.   Document Released: 08/11/2004 Document Revised: 03/25/2015 Document Reviewed: 11/19/2014 Elsevier Interactive Patient Education 2016 [employer]vier [employer]  

## 2015-11-07 NOTE — ED Notes (Signed)
Pt reports insect bites on R thigh for the past 4 days.

## 2015-11-07 NOTE — ED Provider Notes (Signed)
CSN: [id number]     Arrival date & time 11/07/15  1420 History  By signing my name below, I, [name], attest that this documentation has been prepared under the direction and in the presence of Fayrene HelperBowie Marqueze Ramcharan, PA-C Electronically Signed: Soijett Mcdonald, ED Scribe. 11/07/2015. 3:04 PM.   Chief Complaint  Patient presents with  . Insect Bite      The history is provided by the patient. No language interpreter was used.    [name] is a 30 y.o. male who presents to the Emergency Department complaining of an insect bite to right thigh onset 4 days. Pt notes that he was at a friends house when he was bit to his right thigh. Pt reports that the area is pruritic. Pt reports that he has not tried any medications for the relief of his symptoms. Pt denies gait problem, swelling, and any other symptoms.   History reviewed. No pertinent past medical history. History reviewed. No pertinent past surgical history. Family History  Problem Relation Age of Onset  . Diabetes Mother   . Hypertension Mother   . Cancer Mother   . Cancer Father   . Diabetes Father   . Hypertension Father    Social History  Substance Use Topics  . Smoking status: Former Smoker    Types: Cigarettes  . Smokeless tobacco: None  . Alcohol Use: No    Review of Systems  Musculoskeletal: Negative for joint swelling and gait problem.  Skin: Positive for color change. Negative for wound.      Allergies  Review of patient's allergies indicates no known allergies.  Home Medications   Prior to Admission medications   Medication Sig Start Date End Date Taking? Authorizing Provider  clotrimazole (LOTRIMIN) 1 % cream Apply to affected area 2 times daily Patient not taking: Reported on 06/24/2015 10/30/14   April Palumbo, MD  cyclobenzaprine (FLEXERIL) 5 MG tablet Take 1 tablet (5 mg total) by mouth 3 (three) times daily as needed for muscle spasms. Patient not taking: Reported on 06/24/2015 04/10/15   Teressa LowerVrinda Pickering, NP   furosemide (LASIX) 20 MG tablet 1 by mouth every morning as needed for swelling. 06/24/15   Rolland PorterMark James, MD  ibuprofen (ADVIL,MOTRIN) 200 MG tablet Take 400 mg by mouth every 6 (six) hours as needed for mild pain.    Historical Provider, MD   BP 135/77 mmHg  Pulse 81  Temp(Src) 97.5 F (36.4 C) (Oral)  Resp 16  SpO2 99% Physical Exam  Constitutional: He is oriented to person, place, and time. He appears well-developed and well-nourished. No distress.  HENT:  Head: Normocephalic and atraumatic.  Eyes: EOM are normal.  Neck: Neck supple.  Cardiovascular: Normal rate.   Pulmonary/Chest: Effort normal. No respiratory distress.  Abdominal: He exhibits no distension.  Musculoskeletal: Normal range of motion.  Neurological: He is alert and oriented to person, place, and time.  Skin: Skin is warm and dry.  3 small area of skin irritation noted to right interior thigh without any sign of cellulitis. No sign of abscess.  Psychiatric: He has a normal mood and affect. His behavior is normal.  Nursing note and vitals reviewed.   ED Course  Procedures (including critical care time) DIAGNOSTIC STUDIES: Oxygen Saturation is 99% on RA, nl by my interpretation.    COORDINATION OF CARE: 2:57 PM Discussed treatment plan with pt at bedside and pt agreed to plan.    Labs Review Labs Reviewed - No data to display  Imaging Review  No results found.   EKG Interpretation None      MDM   Final diagnoses:  Insect bite of right thigh, initial encounter    Patient with nonspecific eruption. No signs of infection. Pt given benadryl while in ED. Discharge with symptomatic treatment. Return precautions discussed if symptoms worsen.    BP 135/77 mmHg  Pulse 81  Temp(Src) 97.5 F (36.4 C) (Oral)  Resp 16  SpO2 99%   I personally performed the services described in this documentation, which was scribed in my presence. The recorded information has been reviewed and is accurate.       Fayrene Helper, PA-C 11/07/15 1506  Arby Barrette, MD 11/08/15 [phone]

## 2016-01-16 ENCOUNTER — Emergency Department (HOSPITAL_COMMUNITY): Payer: Self-pay

## 2016-01-16 ENCOUNTER — Emergency Department (HOSPITAL_COMMUNITY)
Admission: EM | Admit: 2016-01-16 | Discharge: 2016-01-16 | Disposition: A | Discharge status: Home / Self Care | Payer: Self-pay | Attending: Emergency Medicine | Admitting: Emergency Medicine

## 2016-01-16 ENCOUNTER — Encounter (HOSPITAL_COMMUNITY): Payer: Self-pay

## 2016-01-16 DIAGNOSIS — W010XXA Fall on same level from slipping, tripping and stumbling without subsequent striking against object, initial encounter: Secondary | ICD-10-CM | POA: Insufficient documentation

## 2016-01-16 DIAGNOSIS — Y999 Unspecified external cause status: Secondary | ICD-10-CM | POA: Insufficient documentation

## 2016-01-16 DIAGNOSIS — Z79899 Other long term (current) drug therapy: Secondary | ICD-10-CM | POA: Insufficient documentation

## 2016-01-16 DIAGNOSIS — S63257A Unspecified dislocation of left little finger, initial encounter: Secondary | ICD-10-CM | POA: Insufficient documentation

## 2016-01-16 DIAGNOSIS — S63259A Unspecified dislocation of unspecified finger, initial encounter: Secondary | ICD-10-CM

## 2016-01-16 DIAGNOSIS — Y9389 Activity, other specified: Secondary | ICD-10-CM | POA: Insufficient documentation

## 2016-01-16 DIAGNOSIS — Y929 Unspecified place or not applicable: Secondary | ICD-10-CM | POA: Insufficient documentation

## 2016-01-16 DIAGNOSIS — Z87891 Personal history of nicotine dependence: Secondary | ICD-10-CM | POA: Insufficient documentation

## 2016-01-16 MED ORDER — OXYCODONE-ACETAMINOPHEN 5-325 MG PO TABS: 1.0000 | ORAL_TABLET | Freq: Four times a day (QID) | ORAL | Status: DC | PRN

## 2016-01-16 MED ORDER — OXYCODONE-ACETAMINOPHEN 5-325 MG PO TABS
1.0000 | ORAL_TABLET | Freq: Once | ORAL | Status: AC
  Administered 2016-01-16: 1 via ORAL
  Filled 2016-01-16: qty 1

## 2016-01-16 MED ORDER — NAPROXEN 500 MG PO TABS: 500.0000 mg | ORAL_TABLET | Freq: Two times a day (BID) | ORAL | Status: DC

## 2016-01-16 NOTE — ED Notes (Signed)
Pt. Paul SeatFell chasing the dog.  Lt. 5th finger is dislocated.  Denies any other injuries.   +CNS distal to the injury

## 2016-01-16 NOTE — Discharge Instructions (Signed)
You have been seen today for a finger dislocation. The reduction went well. Your x-ray following the reduction shows proper alignment. Use ibuprofen or naproxen to reduce pain and inflammation. Apply ice to reduce inflammation. Percocet for severe pain. Do not use the Percocet while driving or performing other dangerous activities. The splint is to be used for comfort and protection. Wear it at all times for the first 3 days, removing only to shower. Follow-up with orthopedics should any problems arise or pain fail to improve.

## 2016-01-16 NOTE — ED Notes (Signed)
Patient is in bed waiting for doctor

## 2016-01-16 NOTE — ED Provider Notes (Signed)
CSN: [id number]     Arrival date & time 01/16/16  0707 History   First MD Initiated Contact with Patient 01/16/16 [phone]     Chief Complaint  Patient presents with  . Finger Injury     (Consider location/radiation/quality/duration/timing/severity/associated sxs/prior Treatment) HPI   [name] is a 30 y.o. male, patient with no pertinent past medical history, presenting to the ED with a left fifth finger injury following a fall that occurred this morning. Patient states he was chasing the dog, tripped, and fell landing on his left hand. Patient has deformity and pain to the left fifth finger. Has not taken anything for his pain. Patient denies head trauma, LOC, neck/back pain, nausea/vomiting, or any other pain, injuries, or complaints.    History reviewed. No pertinent past medical history. History reviewed. No pertinent past surgical history. Family History  Problem Relation Age of Onset  . Diabetes Mother   . Hypertension Mother   . Cancer Mother   . Cancer Father   . Diabetes Father   . Hypertension Father    Social History  Substance Use Topics  . Smoking status: Former Smoker    Types: Cigarettes  . Smokeless tobacco: None  . Alcohol Use: No    Review of Systems  Musculoskeletal: Positive for arthralgias. Negative for back pain and neck pain.  Neurological: Negative for dizziness, weakness, light-headedness, numbness and headaches.      Allergies  Review of patient's allergies indicates no known allergies.  Home Medications   Prior to Admission medications   Medication Sig Start Date End Date Taking? Authorizing Provider  naproxen (NAPROSYN) 500 MG tablet Take 1 tablet (500 mg total) by mouth 2 (two) times daily. 01/16/16   Shawn C Joy, PA-C  oxyCODONE-acetaminophen (PERCOCET/ROXICET) 5-325 MG tablet Take 1 tablet by mouth every 6 (six) hours as needed for severe pain. 01/16/16   Shawn C Joy, PA-C   BP 138/112 mmHg  Temp(Src) 97.8 F (36.6 C) (Oral)  Resp 20   SpO2 100% Physical Exam  Constitutional: He appears well-developed and well-nourished. No distress.  HENT:  Head: Normocephalic and atraumatic.  Eyes: Conjunctivae are normal.  Neck: Normal range of motion. Neck supple.  Cardiovascular: Normal rate, regular rhythm and intact distal pulses.   Circulation intact distal to the injury. Capillary refill less than 2 seconds in the fingers of the left hand, especially the left fifth digit.  Pulmonary/Chest: Effort normal.  Musculoskeletal: Normal range of motion.  Dislocation of the left fifth digit at the PIP joint. Ulnar angulation.  Prereduction: Range of motion in the left fifth digit limited by deformity. Full MSK exam deferred until after reduction due to distracting injury. Postreduction: Full ROM in all extremities and spine. No paraspinal tenderness.   Neurological: He is alert.  Before reduction: Full sensory intact in the left hand, including the left fifth finger. Limited strength in the left fifth digit. Post reduction: Full sensory intact in the left hand, including the left fifth finger. 5 out of 5 strength in the left hand and fingers.  Skin: Skin is warm and dry. He is not diaphoretic.  Psychiatric: He has a normal mood and affect. His behavior is normal.  Nursing note and vitals reviewed.   ED Course  Reduction of dislocation Date/Time: 01/16/2016 7:31 AM Performed by: Anselm PancoastJOY, SHAWN C Authorized by: Harolyn RutherfordJOY, SHAWN C Consent: Verbal consent obtained. Risks and benefits: risks, benefits and alternatives were discussed Consent given by: patient Patient understanding: patient states understanding of the procedure  being performed Patient consent: the patient's understanding of the procedure matches consent given Procedure consent: procedure consent matches procedure scheduled Patient identity confirmed: verbally with patient and arm band Local anesthesia used: no Patient sedated: no Patient tolerance: Patient tolerated the procedure  well with no immediate complications Comments: Reduction of left fifth finger PIP joint dislocation.   (including critical care time)  Imaging Review Dg Hand Complete Left  01/16/2016  CLINICAL DATA:  Fall while chasing dog with fifth digit pain, initial encounter EXAM: LEFT HAND - COMPLETE 3+ VIEW COMPARISON:  None. FINDINGS: There is no evidence of fracture or dislocation. There is no evidence of arthropathy or other focal bone abnormality. Soft tissues are unremarkable. IMPRESSION: No acute abnormality noted. By history, there was dislocation of the fifth digit with reduction. Electronically Signed   By: Alcide CleverMark  Lukens M.D.   On: 01/16/2016 08:37   I have personally reviewed and evaluated these images as part of my medical decision-making.   EKG Interpretation None      MDM   Final diagnoses:  Finger dislocation, initial encounter    Paul Chancerik Festa presents with left finger dislocation that occurred following a mechanical fall earlier today.  The options for reduction were discussed with the patient along with the pros and cons. Patient opted for reduction prior to any pain medication. Patient also declined a digital block. Reduction without difficulty. Post reduction physical exam shows proper function. Post reduction x-ray shows proper alignment. Splint placed. Home care, follow-up instructions, and return precautions discussed. Patient voiced understanding of these instructions and is comfortable with discharge. Patient's hypertension is noted and patient is advised to secure a PCP for evaluation of hypertension. No symptoms of hypertensive emergency.  Filed Vitals:   01/16/16 0715 01/16/16 0804  BP: 117/75 140/116  Pulse: 75 73  Temp: 97.8 F (36.6 C)   TempSrc: Oral   Resp: 20 20  SpO2: 99% 100%       Anselm PancoastShawn C Joy, PA-C 01/16/16 0913  Jerelyn ScottMartha Linker, MD 01/16/16 [phone]

## 2016-01-16 NOTE — ED Notes (Signed)
Ice pack applied to the lt. 5th finger and elevated the hand

## 2016-01-16 NOTE — Progress Notes (Signed)
Orthopedic Tech Progress Note Patient Details:  [name] 08/15/1985 [id number]  Ortho Devices Type of Ortho Device: Finger splint Ortho Device/Splint Interventions: Application   [name] 01/16/2016, 8:50 AM

## 2016-02-06 ENCOUNTER — Emergency Department (HOSPITAL_COMMUNITY)
Admission: EM | Admit: 2016-02-06 | Discharge: 2016-02-06 | Disposition: A | Discharge status: Home / Self Care | Payer: Self-pay | Attending: Emergency Medicine | Admitting: Emergency Medicine

## 2016-02-06 ENCOUNTER — Encounter (HOSPITAL_COMMUNITY): Payer: Self-pay | Admitting: Emergency Medicine

## 2016-02-06 DIAGNOSIS — F17210 Nicotine dependence, cigarettes, uncomplicated: Secondary | ICD-10-CM | POA: Insufficient documentation

## 2016-02-06 DIAGNOSIS — M7989 Other specified soft tissue disorders: Secondary | ICD-10-CM | POA: Insufficient documentation

## 2016-02-06 DIAGNOSIS — M25442 Effusion, left hand: Secondary | ICD-10-CM

## 2016-02-06 NOTE — ED Provider Notes (Signed)
CSN: [id number]     Arrival date & time 02/06/16  0347 History   First MD Initiated Contact with Patient 02/06/16 0502     Chief Complaint  Patient presents with  . Finger Injury     (Consider location/radiation/quality/duration/timing/severity/associated sxs/prior Treatment) HPI Comments: Patient seen in emergency department on 01/16/16 and had reduction performed of left fifth digit. Patient has continued swelling at the PIP joints of this finger. He has minimal pain. He is able to use the finger and flex and extend the finger. He was wearing a splint after the injury. He has not followed up. No treatments prior to arrival. He states that he is curious if his finger is going to be deformed like this for a long period of time. The onset of this condition was acute. The course is constant. Aggravating factors: none. Alleviating factors: none.    The history is provided by the patient and medical records.    History reviewed. No pertinent past medical history. History reviewed. No pertinent past surgical history. Family History  Problem Relation Age of Onset  . Diabetes Mother   . Hypertension Mother   . Cancer Mother   . Cancer Father   . Diabetes Father   . Hypertension Father    Social History  Substance Use Topics  . Smoking status: Current Every Day Smoker    Types: Cigarettes  . Smokeless tobacco: None  . Alcohol Use: No    Review of Systems  Constitutional: Negative for activity change.  Musculoskeletal: Positive for joint swelling and arthralgias. Negative for back pain, gait problem and neck pain.  Skin: Negative for wound.  Neurological: Negative for weakness and numbness.      Allergies  Review of patient's allergies indicates no known allergies.  Home Medications   Prior to Admission medications   Medication Sig Start Date End Date Taking? Authorizing Provider  naproxen (NAPROSYN) 500 MG tablet Take 1 tablet (500 mg total) by mouth 2 (two) times daily.  01/16/16   Shawn C Joy, PA-C  oxyCODONE-acetaminophen (PERCOCET/ROXICET) 5-325 MG tablet Take 1 tablet by mouth every 6 (six) hours as needed for severe pain. 01/16/16   Shawn C Joy, PA-C   BP 153/102 mmHg  Pulse 102  Temp(Src) 99.2 F (37.3 C) (Oral)  Resp 18  SpO2 100% Physical Exam  Constitutional: He appears well-developed and well-nourished.  HENT:  Head: Normocephalic and atraumatic.  Eyes: Conjunctivae are normal.  Neck: Normal range of motion. Neck supple.  Cardiovascular: Normal pulses.   Musculoskeletal: He exhibits tenderness. He exhibits no edema.       Right hand: He exhibits tenderness and swelling. He exhibits normal range of motion.       Hands: Normal capillary refill.  Neurological: He is alert. No sensory deficit.  Motor, sensation, and vascular distal to the injury is fully intact.   Skin: Skin is warm and dry.  Psychiatric: He has a normal mood and affect.  Nursing note and vitals reviewed.   ED Course  Procedures (including critical care time)  5:11 AM Patient seen and examined. Offered repeat imaging. Patient declines. Encouraged ortho hand follow-up. Referral given.   Vital signs reviewed and are as follows: BP 153/102 mmHg  Pulse 102  Temp(Src) 99.2 F (37.3 C) (Oral)  Resp 18  SpO2 100%    MDM   Final diagnoses:  Finger joint swelling, left   Patient with residual swelling but good function of the PIP joint as above. Finger is neurovascularly intact.  Patient is concerned about the residual and continued deformity. Encouraged follow-up with hand if desired.   Renne Crigler, PA-C 02/06/16 0531  Melene Plan, DO 02/06/16 [phone]

## 2016-02-06 NOTE — ED Notes (Addendum)
C/o L 5th digit deformity.  Pt states he was seen in ED on 7/1 after falling and dislocating finger.  States his finger is still deformed.  Denies pain.

## 2016-05-04 DIAGNOSIS — M79669 Pain in unspecified lower leg: Secondary | ICD-10-CM | POA: Insufficient documentation

## 2016-05-04 DIAGNOSIS — Z5321 Procedure and treatment not carried out due to patient leaving prior to being seen by health care provider: Secondary | ICD-10-CM | POA: Insufficient documentation

## 2016-05-04 DIAGNOSIS — F17210 Nicotine dependence, cigarettes, uncomplicated: Secondary | ICD-10-CM | POA: Insufficient documentation

## 2016-05-04 NOTE — ED Notes (Signed)
Called pt for triage did not answer 

## 2016-05-04 NOTE — ED Notes (Signed)
Called pt again still no answer.

## 2016-05-05 ENCOUNTER — Emergency Department (HOSPITAL_COMMUNITY)
Admission: EM | Admit: 2016-05-05 | Discharge: 2016-05-05 | Disposition: A | Discharge status: Home / Self Care | Payer: Self-pay | Attending: Dermatology | Admitting: Dermatology

## 2016-05-05 ENCOUNTER — Encounter (HOSPITAL_COMMUNITY): Payer: Self-pay | Admitting: Emergency Medicine

## 2016-05-05 NOTE — ED Notes (Signed)
Pt states he no longer wants to be seen.

## 2016-05-05 NOTE — ED Triage Notes (Signed)
Pt states he was walking a lot when he started to have pain in his shins however same has subsided since he has been in the waiting room

## 2016-05-06 ENCOUNTER — Encounter (HOSPITAL_COMMUNITY): Payer: Self-pay

## 2016-05-06 ENCOUNTER — Emergency Department (HOSPITAL_COMMUNITY)
Admission: EM | Admit: 2016-05-06 | Discharge: 2016-05-07 | Disposition: A | Discharge status: Home / Self Care | Payer: Self-pay | Attending: Emergency Medicine | Admitting: Emergency Medicine

## 2016-05-06 DIAGNOSIS — F17210 Nicotine dependence, cigarettes, uncomplicated: Secondary | ICD-10-CM | POA: Insufficient documentation

## 2016-05-06 DIAGNOSIS — R7401 Elevation of levels of liver transaminase levels: Secondary | ICD-10-CM

## 2016-05-06 DIAGNOSIS — R7989 Other specified abnormal findings of blood chemistry: Secondary | ICD-10-CM

## 2016-05-06 DIAGNOSIS — R740 Nonspecific elevation of levels of transaminase and lactic acid dehydrogenase [LDH]: Secondary | ICD-10-CM | POA: Insufficient documentation

## 2016-05-06 DIAGNOSIS — R1013 Epigastric pain: Secondary | ICD-10-CM | POA: Insufficient documentation

## 2016-05-06 DIAGNOSIS — R945 Abnormal results of liver function studies: Secondary | ICD-10-CM | POA: Insufficient documentation

## 2016-05-06 NOTE — ED Triage Notes (Signed)
Patient c/o epigastric pain that began x2 hours ago.  Patient denies eating anything that would cause the pain, denies nausea and vomiting.  Denies chest pain and SOB.

## 2016-05-06 NOTE — ED Provider Notes (Signed)
[facility]-EMERGENCY DEPT Provider Note   CSN: [id number] Arrival date & time: 05/06/16  2317  By signing my name below, I, Arianna Nassar, attest that this documentation has been prepared under the direction and in the presence of Shon Baton, MD.  Electronically Signed: Octavia Heir, ED Scribe. 05/07/16. 12:17 AM.    History   Chief Complaint Chief Complaint  Patient presents with  . Abdominal Pain    The history is provided by the patient. No language interpreter was used.   HPI Comments: [name] is a 30 y.o. male who presents to the Emergency Department complaining of sudden onset, moderate, non-radiating, epigastric abdominal pain x 2 hours. Pt describes the pain as "a boulder sitting on his abdomen". He notes smoking marijuana and crystal meth. Denies IV drug use. The last time he used crystal meth was this morning. There are no aggravating or modifying factors noted. Pt has not taken any medication to alleviate his pain. Denies nausea, fever, chills, vomiting, diarrhea, constipation, hematochezia, chest pain, shortness of breath, dysuria, hx of kidney stones, or abdominal surgical hx. He further denies etoh use. Pt smokes ~ 1 pack of cigarettes a day.  History reviewed. No pertinent past medical history.  There are no active problems to display for this patient.   History reviewed. No pertinent surgical history.     Home Medications    Prior to Admission medications   Medication Sig Start Date End Date Taking? Authorizing Provider  naproxen (NAPROSYN) 500 MG tablet Take 1 tablet (500 mg total) by mouth 2 (two) times daily. 01/16/16   Shawn C Joy, PA-C  omeprazole (PRILOSEC) 20 MG capsule Take 1 capsule (20 mg total) by mouth daily. 05/07/16   Shon Baton, MD  oxyCODONE-acetaminophen (PERCOCET/ROXICET) 5-325 MG tablet Take 1 tablet by mouth every 6 (six) hours as needed for severe pain. 01/16/16   Anselm Pancoast, PA-C    Family History Family History  Problem  Relation Age of Onset  . Diabetes Mother   . Hypertension Mother   . Cancer Mother   . Cancer Father   . Diabetes Father   . Hypertension Father     Social History Social History  Substance Use Topics  . Smoking status: Current Every Day Smoker    Packs/day: 1.00    Years: 10.00    Types: Cigarettes  . Smokeless tobacco: Never Used  . Alcohol use No     Allergies   Review of patient's allergies indicates no known allergies.   Review of Systems Review of Systems  Constitutional: Negative for chills and fever.  Respiratory: Negative for shortness of breath.   Cardiovascular: Negative for chest pain.  Gastrointestinal: Positive for abdominal pain. Negative for constipation, diarrhea, nausea and vomiting.  Genitourinary: Negative for dysuria.  All other systems reviewed and are negative.    Physical Exam Updated Vital Signs BP 119/79 (BP Location: Left Arm)   Pulse 79   Temp 97 F (36.1 C)   Resp 18   SpO2 100%   Physical Exam  Constitutional: He is oriented to person, place, and time.  Thin  HENT:  Head: Normocephalic and atraumatic.  Eyes: Pupils are equal, round, and reactive to light.  Cardiovascular: Normal rate, regular rhythm and normal heart sounds.   No murmur heard. Pulmonary/Chest: Effort normal and breath sounds normal. No respiratory distress. He has no wheezes.  Abdominal: Soft. Bowel sounds are normal. He exhibits no mass. There is tenderness. There is no rebound and no  guarding.  Musculoskeletal: He exhibits no edema.  Neurological: He is alert and oriented to person, place, and time.  Skin: Skin is warm and dry.  Psychiatric: He has a normal mood and affect.  Nursing note and vitals reviewed.    ED Treatments / Results  DIAGNOSTIC STUDIES: Oxygen Saturation is 98% on RA, normal by my interpretation.  COORDINATION OF CARE:  12:15 AM Discussed treatment plan with pt at bedside and pt agreed to plan.  Labs (all labs ordered are listed,  but only abnormal results are displayed) Labs Reviewed  CBC WITH DIFFERENTIAL/PLATELET - Abnormal; Notable for the following:       Result Value   MCHC 36.4 (*)    All other components within normal limits  COMPREHENSIVE METABOLIC PANEL - Abnormal; Notable for the following:    Sodium 134 (*)    Potassium 3.2 (*)    AST 425 (*)    ALT 918 (*)    All other components within normal limits  LIPASE, BLOOD  URINALYSIS, ROUTINE W REFLEX MICROSCOPIC (NOT AT [facility])  HEPATITIS PANEL, ACUTE    EKG  EKG Interpretation  Date/Time:  Saturday May 07 2016 00:45:09 EDT Ventricular Rate:  72 PR Interval:    QRS Duration: 121 QT Interval:  422 QTC Calculation: 462 R Axis:   87 Text Interpretation:  Sinus rhythm Probable left atrial enlargement Right bundle branch block ST elev, probable normal early repol pattern No significant change since last tracing Confirmed by HORTON  MD, COURTNEY ([id number]) on 05/07/2016 12:54:36 AM       Radiology [place] Abdomen Limited Ruq  Result Date: 05/07/2016 CLINICAL DATA:  Sudden onset epigastric and right upper quadrant pain for 5 hours. : US ABDOMEN LIMITED - RIGHT UPPER QUADRANT COMPARISON:  None. FINDINGS: Gallbladder: Gallbladder appears contracted. Small folds of the neck of the gallbladder. No cholelithiasis. No secondary signs of acute cholecystitis. No sonographic Murphy's sign. Common bile duct: Diameter: Normal at 5 mm Liver: No space-occupying mass of the liver. Slightly coarsened appearing since the hepatic echotexture. Monophasic sluggish flow demonstrated within the portal vein without thrombosis. IMPRESSION: No findings for the patient's pain. Electronically Signed   By: Tollie Ethavid  Kwon M.D.   On: 05/07/2016 03:07    Procedures Procedures (including critical care time)  Medications Ordered in ED Medications  gi cocktail (Maalox,Lidocaine,Donnatal) (30 mLs Oral Given 05/07/16 0207)     Initial Impression / Assessment and Plan / ED Course  I have  reviewed the triage vital signs and the nursing notes.  Pertinent labs & imaging results that were available during my care of the patient were reviewed by me and considered in my medical decision making (see chart for details).  Clinical Course    Patient presents with epigastric abdominal pain.  Nontoxic on exam. Pain currently 2 out of 10. Mild tenderness without peritonitis. EKG is reassuring. Lab work obtained. Notable for elevated LFTs. ALT greater than AST. Bili is normal. Right upper quadrant ultrasound obtained. Denies history of hepatitis. Denies alcohol or IV drug use. On recheck, pain is one out of 10 after GI cocktail. Right upper quadrant ultrasound is negative. Discussed at length with the patient his transaminitis. Unknown etiology at this time. He does not appear otherwise in shock. He denies Tylenol abuse or alcohol abuse. Patient was offered admission for monitoring and further workup. He does not want to come into the hospital. I stressed with the patient that it is very important that he follow-up with gastroenterology. Patient stated  understanding. If anything changes, his abdominal pain worsens or he has any new or worsening symptoms he should be reevaluated immediately.  After history, exam, and medical workup I feel the patient has been appropriately medically screened and is safe for discharge home. Pertinent diagnoses were discussed with the patient. Patient was given return precautions.  I personally performed the services described in this documentation, which was scribed in my presence. The recorded information has been reviewed and is accurate.  Final Clinical Impressions(s) / ED Diagnoses   Final diagnoses:  Abnormal LFTs  Epigastric pain  Transaminitis    New Prescriptions New Prescriptions   OMEPRAZOLE (PRILOSEC) 20 MG CAPSULE    Take 1 capsule (20 mg total) by mouth daily.     Shon Baton, MD 05/07/16 [phone]

## 2016-05-07 ENCOUNTER — Emergency Department (HOSPITAL_COMMUNITY): Payer: Self-pay

## 2016-05-07 DIAGNOSIS — M722 Plantar fascial fibromatosis: Secondary | ICD-10-CM | POA: Insufficient documentation

## 2016-05-07 DIAGNOSIS — X503XXA Overexertion from repetitive movements, initial encounter: Secondary | ICD-10-CM | POA: Insufficient documentation

## 2016-05-07 DIAGNOSIS — M2142 Flat foot [pes planus] (acquired), left foot: Secondary | ICD-10-CM | POA: Insufficient documentation

## 2016-05-07 DIAGNOSIS — Y929 Unspecified place or not applicable: Secondary | ICD-10-CM | POA: Insufficient documentation

## 2016-05-07 DIAGNOSIS — Y9301 Activity, walking, marching and hiking: Secondary | ICD-10-CM | POA: Insufficient documentation

## 2016-05-07 DIAGNOSIS — F17210 Nicotine dependence, cigarettes, uncomplicated: Secondary | ICD-10-CM | POA: Insufficient documentation

## 2016-05-07 DIAGNOSIS — Z590 Homelessness: Secondary | ICD-10-CM | POA: Insufficient documentation

## 2016-05-07 DIAGNOSIS — M2141 Flat foot [pes planus] (acquired), right foot: Secondary | ICD-10-CM | POA: Insufficient documentation

## 2016-05-07 DIAGNOSIS — T796XXA Traumatic ischemia of muscle, initial encounter: Secondary | ICD-10-CM | POA: Insufficient documentation

## 2016-05-07 DIAGNOSIS — Y999 Unspecified external cause status: Secondary | ICD-10-CM | POA: Insufficient documentation

## 2016-05-07 LAB — COMPREHENSIVE METABOLIC PANEL
ALBUMIN: 4 g/dL (ref 3.5–5.0)
ALK PHOS: 124 U/L (ref 38–126)
ALT: 918 U/L — AB (ref 17–63)
AST: 425 U/L — ABNORMAL HIGH (ref 15–41)
Anion gap: 6 (ref 5–15)
BUN: 10 mg/dL (ref 6–20)
CALCIUM: 9 mg/dL (ref 8.9–10.3)
CO2: 26 mmol/L (ref 22–32)
CREATININE: 0.73 mg/dL (ref 0.61–1.24)
Chloride: 102 mmol/L (ref 101–111)
GFR calc non Af Amer: >60 mL/min (ref >60)
GLUCOSE: 99 mg/dL (ref 65–99)
Potassium: 3.2 mmol/L — ABNORMAL LOW (ref 3.5–5.1)
SODIUM: 134 mmol/L — AB (ref 135–145)
Total Bilirubin: 1 mg/dL (ref 0.3–1.2)
Total Protein: 7.2 g/dL (ref 6.5–8.1)

## 2016-05-07 LAB — LIPASE, BLOOD: Lipase: 33 U/L (ref 11–51)

## 2016-05-07 LAB — CBC WITH DIFFERENTIAL/PLATELET
Basophils Absolute: 0 10*3/uL (ref 0.0–0.1)
Basophils Relative: 0 %
EOS ABS: 0.3 10*3/uL (ref 0.0–0.7)
Eosinophils Relative: 3 %
HCT: 40.4 % (ref 39.0–52.0)
HEMOGLOBIN: 14.7 g/dL (ref 13.0–17.0)
LYMPHS ABS: 3.9 10*3/uL (ref 0.7–4.0)
Lymphocytes Relative: 40 %
MCH: 31.2 pg (ref 26.0–34.0)
MCHC: 36.4 g/dL — ABNORMAL HIGH (ref 30.0–36.0)
MCV: 85.8 fL (ref 78.0–100.0)
Monocytes Absolute: 0.8 10*3/uL (ref 0.1–1.0)
Monocytes Relative: 8 %
NEUTROS PCT: 49 %
Neutro Abs: 4.9 10*3/uL (ref 1.7–7.7)
Platelets: 315 10*3/uL (ref 150–400)
RBC: 4.71 MIL/uL (ref 4.22–5.81)
RDW: 13.7 % (ref 11.5–15.5)
WBC: 10 10*3/uL (ref 4.0–10.5)

## 2016-05-07 MED ORDER — GI COCKTAIL ~~LOC~~
30.0000 mL | Freq: Once | ORAL | Status: AC
Start: 2016-05-07 — End: 2016-05-07
  Administered 2016-05-07: 30 mL via ORAL
  Filled 2016-05-07: qty 30

## 2016-05-07 MED ORDER — OMEPRAZOLE 20 MG PO CPDR: 20.0000 mg | DELAYED_RELEASE_CAPSULE | Freq: Every day | ORAL | 0 refills | Status: DC

## 2016-05-07 NOTE — ED Notes (Signed)
US in room 

## 2016-05-07 NOTE — ED Notes (Signed)
Asked for urine  

## 2016-05-07 NOTE — Discharge Instructions (Signed)
You were seen today for abdominal pain. The cause of your pain at this time is not clear; however, your liver function tests were markedly elevated. This can be because of alcohol abuse, liver disease, hepatitis. You need to have further workup. Your ultrasound was negative. This is reassuring. It is very important that you follow-up with gastroenterology. If you have any new or worsening pain, vomiting, diarrhea or any new or worsening symptoms she needs to be reevaluated.

## 2016-05-07 NOTE — ED Notes (Signed)
Provider In room 

## 2016-05-07 NOTE — ED Notes (Signed)
Ask pt to pee for collection still says no not at this time

## 2016-05-07 NOTE — ED Notes (Signed)
Pt cannot urinate at this time

## 2016-05-07 NOTE — ED Notes (Signed)
Gave Pt. urinal.  

## 2016-05-08 ENCOUNTER — Emergency Department (HOSPITAL_COMMUNITY)
Admission: EM | Admit: 2016-05-08 | Discharge: 2016-05-08 | Disposition: A | Discharge status: Home / Self Care | Payer: Self-pay | Attending: Emergency Medicine | Admitting: Emergency Medicine

## 2016-05-08 ENCOUNTER — Encounter (HOSPITAL_COMMUNITY): Payer: Self-pay | Admitting: Emergency Medicine

## 2016-05-08 DIAGNOSIS — S86899A Other injury of other muscle(s) and tendon(s) at lower leg level, unspecified leg, initial encounter: Secondary | ICD-10-CM

## 2016-05-08 DIAGNOSIS — M722 Plantar fascial fibromatosis: Secondary | ICD-10-CM

## 2016-05-08 LAB — HEPATITIS PANEL, ACUTE
HCV Ab: >11 s/co ratio — ABNORMAL HIGH (ref 0.0–0.9)
HEP A IGM: NEGATIVE
HEP B C IGM: NEGATIVE
Hepatitis B Surface Ag: NEGATIVE

## 2016-05-08 MED ORDER — IBUPROFEN 400 MG PO TABS
800.0000 mg | ORAL_TABLET | Freq: Once | ORAL | Status: AC
  Administered 2016-05-08: 800 mg via ORAL
  Filled 2016-05-08: qty 2

## 2016-05-08 NOTE — ED Triage Notes (Signed)
Pt presents to ED for assessment of bilateral chronic foot pain.  Pt sts seen her before for the same.  Pt sts pain radiates up to his knees, worse on the right.  Pt sts he has tried changing shoes, without relief.  Pt denies any recent injury.

## 2016-05-08 NOTE — ED Provider Notes (Signed)
[facility]EMERGENCY DEPT Provider Note   CSN: [id number] Arrival date & time: 05/07/16  2358     History   Chief Complaint Chief Complaint  Patient presents with  . Foot Pain    HPI [name] is a 30 y.o. male.  Patient presents to the emergency department with chief complaint of bilateral chronic foot pain. He states that he is homeless and has to walk everywhere. This exacerbates his symptoms. He also complains of pain in his bilateral shins. This is also worsened with all the walking that he does. He has not tried anything to alleviate his symptoms. Symptoms are worsened with walking. He denies any other associated symptoms.   The history is provided by the patient. No language interpreter was used.    History reviewed. No pertinent past medical history.  There are no active problems to display for this patient.   History reviewed. No pertinent surgical history.     Home Medications    Prior to Admission medications   Medication Sig Start Date End Date Taking? Authorizing Provider  naproxen (NAPROSYN) 500 MG tablet Take 1 tablet (500 mg total) by mouth 2 (two) times daily. 01/16/16   Shawn C Joy, PA-C  omeprazole (PRILOSEC) 20 MG capsule Take 1 capsule (20 mg total) by mouth daily. 05/07/16   Shon Baton, MD  oxyCODONE-acetaminophen (PERCOCET/ROXICET) 5-325 MG tablet Take 1 tablet by mouth every 6 (six) hours as needed for severe pain. 01/16/16   Anselm Pancoast, PA-C    Family History Family History  Problem Relation Age of Onset  . Diabetes Mother   . Hypertension Mother   . Cancer Mother   . Cancer Father   . Diabetes Father   . Hypertension Father     Social History Social History  Substance Use Topics  . Smoking status: Current Every Day Smoker    Packs/day: 0.50    Years: 10.00    Types: Cigarettes  . Smokeless tobacco: Never Used  . Alcohol use No     Allergies   Review of patient's allergies indicates no known allergies.   Review of  Systems Review of Systems  All other systems reviewed and are negative.    Physical Exam Updated Vital Signs BP 119/79 (BP Location: Right Arm)   Pulse 80   Temp 97.8 F (36.6 C) (Oral)   Resp 18   SpO2 100%   Physical Exam  Constitutional: He is oriented to person, place, and time. He appears well-developed and well-nourished.  HENT:  Head: Normocephalic and atraumatic.  Eyes: Conjunctivae and EOM are normal.  Neck: Normal range of motion.  Cardiovascular: Normal rate.   Pulmonary/Chest: Effort normal.  Abdominal: He exhibits no distension.  Musculoskeletal: Normal range of motion.  Bilateral pes planus Tenderness to palpation over the plantar fascia Also tenderness to palpation over bilateral anterior tibialis musculature, no evidence of swelling or edema, no posterior calf tenderness, doubt DVT  Neurological: He is alert and oriented to person, place, and time.  Skin: Skin is dry.  Psychiatric: He has a normal mood and affect. His behavior is normal. Judgment and thought content normal.  Nursing note and vitals reviewed.    ED Treatments / Results  Labs (all labs ordered are listed, but only abnormal results are displayed) Labs Reviewed - No data to display  EKG  EKG Interpretation None       Radiology US Abdomen Limited Ruq  Result Date: 05/07/2016 CLINICAL DATA:  Sudden onset epigastric and right upper  quadrant pain for 5 hours. : US ABDOMEN LIMITED - RIGHT UPPER QUADRANT COMPARISON:  None. FINDINGS: Gallbladder: Gallbladder appears contracted. Small folds of the neck of the gallbladder. No cholelithiasis. No secondary signs of acute cholecystitis. No sonographic Murphy's sign. Common bile duct: Diameter: Normal at 5 mm Liver: No space-occupying mass of the liver. Slightly coarsened appearing since the hepatic echotexture. Monophasic sluggish flow demonstrated within the portal vein without thrombosis. IMPRESSION: No findings for the patient's pain.  Electronically Signed   By: Tollie Ethavid  Kwon M.D.   On: 05/07/2016 03:07    Procedures Procedures (including critical care time)  Medications Ordered in ED Medications  ibuprofen (ADVIL,MOTRIN) tablet 800 mg (not administered)     Initial Impression / Assessment and Plan / ED Course  I have reviewed the triage vital signs and the nursing notes.  Pertinent labs & imaging results that were available during my care of the patient were reviewed by me and considered in my medical decision making (see chart for details).  Clinical Course    Patient with severe pes planus. Complains pain on the arches of his feet bilaterally. I suspect that he has plantar fasciitis. He also may have a component of shin splints. Have recommended rest, ice, and stretching. Will give some ibuprofen here tonight. Also recommended arch supports and compression sleeves for shins.  Final Clinical Impressions(s) / ED Diagnoses   Final diagnoses:  Plantar fasciitis of left foot  Plantar fasciitis of right foot  Shin splints, unspecified laterality, initial encounter    New Prescriptions New Prescriptions   No medications on file     Roxy HorsemanRobert Porshia Blizzard, PA-C 05/08/16 [id number]    Shon Batonourtney F Horton, MD 05/09/16 [phone]

## 2016-05-08 NOTE — ED Notes (Signed)
Pt understood dc material. NAD noted. 

## 2016-05-08 NOTE — Discharge Instructions (Signed)
Your need to get arch supports for your shoes.  You may also benefit from compression sleeves for your lower legs to help with shin splints.

## 2016-05-09 ENCOUNTER — Telehealth (HOSPITAL_BASED_OUTPATIENT_CLINIC_OR_DEPARTMENT_OTHER): Payer: Self-pay | Admitting: Emergency Medicine

## 2016-05-09 NOTE — Telephone Encounter (Signed)
Patient with resulted Antihcv  >11.0, pt [facility] for follow[facility] from EDP

## 2016-05-10 ENCOUNTER — Encounter (HOSPITAL_COMMUNITY): Payer: Self-pay

## 2016-05-10 DIAGNOSIS — F17210 Nicotine dependence, cigarettes, uncomplicated: Secondary | ICD-10-CM | POA: Insufficient documentation

## 2016-05-10 DIAGNOSIS — R600 Localized edema: Secondary | ICD-10-CM | POA: Insufficient documentation

## 2016-05-10 NOTE — ED Triage Notes (Signed)
Chronic bilateral leg swelling and pain.  Today pain worsened.

## 2016-05-11 ENCOUNTER — Emergency Department (HOSPITAL_COMMUNITY)
Admission: EM | Admit: 2016-05-11 | Discharge: 2016-05-11 | Disposition: A | Discharge status: Home / Self Care | Payer: Self-pay | Attending: Emergency Medicine | Admitting: Emergency Medicine

## 2016-05-11 DIAGNOSIS — R609 Edema, unspecified: Secondary | ICD-10-CM

## 2016-05-11 LAB — I-STAT CHEM 8, ED
BUN: 4 mg/dL — AB (ref 6–20)
CHLORIDE: 103 mmol/L (ref 101–111)
CREATININE: 0.7 mg/dL (ref 0.61–1.24)
Calcium, Ion: 1.13 mmol/L — ABNORMAL LOW (ref 1.15–1.40)
GLUCOSE: 110 mg/dL — AB (ref 65–99)
HCT: 39 % (ref 39.0–52.0)
Hemoglobin: 13.3 g/dL (ref 13.0–17.0)
POTASSIUM: 3.3 mmol/L — AB (ref 3.5–5.1)
Sodium: 141 mmol/L (ref 135–145)
TCO2: 27 mmol/L (ref 0–100)

## 2016-05-11 LAB — URINALYSIS, ROUTINE W REFLEX MICROSCOPIC
Bilirubin Urine: NEGATIVE
GLUCOSE, UA: NEGATIVE mg/dL
Hgb urine dipstick: NEGATIVE
Ketones, ur: NEGATIVE mg/dL
LEUKOCYTES UA: NEGATIVE
Nitrite: NEGATIVE
PH: 7 (ref 5.0–8.0)
Protein, ur: NEGATIVE mg/dL
SPECIFIC GRAVITY, URINE: 1.015 (ref 1.005–1.030)

## 2016-05-11 MED ORDER — TRAMADOL HCL 50 MG PO TABS: 50.0000 mg | ORAL_TABLET | Freq: Two times a day (BID) | ORAL | 0 refills | Status: DC | PRN

## 2016-05-11 MED ORDER — IBUPROFEN 800 MG PO TABS
800.0000 mg | ORAL_TABLET | Freq: Once | ORAL | Status: AC
  Administered 2016-05-11: 800 mg via ORAL
  Filled 2016-05-11: qty 1

## 2016-05-11 MED ORDER — IBUPROFEN 800 MG PO TABS: 800.0000 mg | ORAL_TABLET | Freq: Three times a day (TID) | ORAL | 0 refills | Status: DC

## 2016-05-11 NOTE — ED Notes (Signed)
EDP at bedside  

## 2016-05-11 NOTE — ED Provider Notes (Signed)
[facility] Provider Note   CSN: [id number] Arrival date & time: 05/10/16  2243     History   Chief Complaint Chief Complaint  Patient presents with  . Leg Swelling  . Leg Pain    HPI [name] is a 30 y.o. male.  HPI   Pt is a homeless 30 y/o caucasian male presents to the ER with gradually worsening bilateral LE edema. He is homeless, reports being up on his feet for the past several days and is sleeping in a laundry mat. Mild swelling in both feet, ankles and to shins, no redness, mild achiness, no   History reviewed. No pertinent past medical history.  There are no active problems to display for this patient.   History reviewed. No pertinent surgical history.     Home Medications    Prior to Admission medications   Medication Sig Start Date End Date Taking? Authorizing Provider  omeprazole (PRILOSEC) 20 MG capsule Take 1 capsule (20 mg total) by mouth daily. 05/07/16  Yes Shon Baton, MD  ibuprofen (ADVIL,MOTRIN) 800 MG tablet Take 1 tablet (800 mg total) by mouth 3 (three) times daily. 05/11/16   Danelle Berry, PA-C  naproxen (NAPROSYN) 500 MG tablet Take 1 tablet (500 mg total) by mouth 2 (two) times daily. Patient not taking: Reported on 05/11/2016 01/16/16   Anselm Pancoast, PA-C  oxyCODONE-acetaminophen (PERCOCET/ROXICET) 5-325 MG tablet Take 1 tablet by mouth every 6 (six) hours as needed for severe pain. Patient not taking: Reported on 05/11/2016 01/16/16   Shawn C Joy, PA-C  traMADol (ULTRAM) 50 MG tablet Take 1 tablet (50 mg total) by mouth every 12 (twelve) hours as needed for severe pain. 05/11/16   Danelle Berry, PA-C    Family History Family History  Problem Relation Age of Onset  . Diabetes Mother   . Hypertension Mother   . Cancer Mother   . Cancer Father   . Diabetes Father   . Hypertension Father     Social History Social History  Substance Use Topics  . Smoking status: Current Every Day Smoker    Packs/day: 0.50    Years: 10.00      Types: Cigarettes  . Smokeless tobacco: Never Used  . Alcohol use No     Allergies   Review of patient's allergies indicates no known allergies.   Review of Systems Review of Systems  All other systems reviewed and are negative.    Physical Exam Updated Vital Signs BP 121/79 (BP Location: Right Arm)   Pulse 99   Temp 98.1 F (36.7 C) (Oral)   Resp 16   Ht 6\' 1"  (1.854 m)   Wt 68 kg   SpO2 99%   BMI 19.79 kg/m   Physical Exam  Constitutional: He is oriented to person, place, and time. He appears well-developed and well-nourished. No distress.  HENT:  Head: Normocephalic and atraumatic.  Right Ear: External ear normal.  Left Ear: External ear normal.  Nose: Nose normal.  Mouth/Throat: Oropharynx is clear and moist.  Eyes: Conjunctivae and EOM are normal. Pupils are equal, round, and reactive to light. Right eye exhibits no discharge. Left eye exhibits no discharge. No scleral icterus.  Neck: Normal range of motion.  Cardiovascular: Normal rate, regular rhythm, normal heart sounds and intact distal pulses.  Exam reveals no gallop and no friction rub.   No murmur heard. Pulmonary/Chest: Effort normal and breath sounds normal. No stridor. No respiratory distress. He has no wheezes. He has no  rales.  Abdominal: Soft. Bowel sounds are normal. He exhibits no distension.  Musculoskeletal: Normal range of motion. He exhibits edema. He exhibits no tenderness.  Mild bilateral ankle and pedal edema, non-pitting, no erythema, no induration or warmth  Neurological: He is alert and oriented to person, place, and time. He exhibits normal muscle tone. Coordination normal.  Skin: Skin is warm and dry. Capillary refill takes less than 2 seconds. No rash noted. He is not diaphoretic. No pallor.  Psychiatric: He has a normal mood and affect. His behavior is normal. Judgment and thought content normal.  Nursing note and vitals reviewed.    ED Treatments / Results  Labs (all labs  ordered are listed, but only abnormal results are displayed) Labs Reviewed  I-STAT CHEM 8, ED - Abnormal; Notable for the following:       Result Value   Potassium 3.3 (*)    BUN 4 (*)    Glucose, Bld 110 (*)    Calcium, Ion 1.13 (*)    All other components within normal limits  URINALYSIS, ROUTINE W REFLEX MICROSCOPIC (NOT AT [facility])    EKG  EKG Interpretation None       Radiology No results found.  Procedures Procedures (including critical care time)  Medications Ordered in ED Medications  ibuprofen (ADVIL,MOTRIN) tablet 800 mg (800 mg Oral Given 05/11/16 0321)     Initial Impression / Assessment and Plan / ED Course  I have reviewed the triage vital signs and the nursing notes.  Pertinent labs & imaging results that were available during my care of the patient were reviewed by me and considered in my medical decision making (see chart for details).  Clinical Course  Patient complains of bilateral leg swelling is gradually progressing, associated with prolonged periods of time up on his feet.  He was seen in the ER 3 days ago for foot pain bilaterally that he states is chronic and secondary to homelessness and prolonged amount of time walking. He states today that he is sleeping in a laundromat and that has not had time to lay down and put his feet up. He denies erythema, warmth, fever. No injury. He denies any shortness of breath or chest pain.  No concern for DVT, infection or muscle skeletal injury, suspect this is dependent edema.  No chronic appearing skin changes such as hyperpigmentation seen and venous stasis, suspect this is somewhat acute and related to homelessness and prolonged periods of time on his feet.  He is otherwise well appearing.  Final Clinical Impressions(s) / ED Diagnoses   Final diagnoses:  Dependent edema   The patient was encouraged to find ways to rest his legs and elevate them above his heart. He was able to rest for a short time in the ER  and was told he's welcome to stay in the ER waiting room. He was given a list of local resources for shelters.  He was given something to eat and drink, was given ibuprofen for his mild discomfort. He was discharged in good condition with stable vital signs  Vitals:   05/10/16 2247 05/10/16 2249 05/11/16 0234  BP: 129/84  121/79  Pulse: 99  99  Resp: 18  16  Temp: 98.1 F (36.7 C)    TempSrc: Oral    SpO2: 98%  99%  Weight:  68 kg   Height:  6\' 1"  (1.854 m)       New Prescriptions New Prescriptions   IBUPROFEN (ADVIL,MOTRIN) 800 MG TABLET  Take 1 tablet (800 mg total) by mouth 3 (three) times daily.   TRAMADOL (ULTRAM) 50 MG TABLET    Take 1 tablet (50 mg total) by mouth every 12 (twelve) hours as needed for severe pain.     Danelle Berry, PA-C 05/13/16 1610    Tomasita Crumble, MD 05/14/16 1224

## 2017-05-21 ENCOUNTER — Emergency Department (HOSPITAL_COMMUNITY)
Admission: EM | Admit: 2017-05-21 | Discharge: 2017-05-21 | Disposition: A | Discharge status: Home / Self Care | Payer: Self-pay | Attending: Emergency Medicine | Admitting: Emergency Medicine

## 2017-05-21 ENCOUNTER — Encounter (HOSPITAL_COMMUNITY): Payer: Self-pay | Admitting: Emergency Medicine

## 2017-05-21 DIAGNOSIS — Z5321 Procedure and treatment not carried out due to patient leaving prior to being seen by health care provider: Secondary | ICD-10-CM | POA: Insufficient documentation

## 2017-05-21 NOTE — ED Notes (Signed)
Patient called for room placement x3 with no answer. 

## 2017-05-21 NOTE — ED Notes (Signed)
Patient called for room placement x2 with no answer. 

## 2017-05-21 NOTE — ED Triage Notes (Addendum)
Patient c/o insect bite to left ankle since last night. Redness noted to site. Ambulatory.

## 2017-09-11 ENCOUNTER — Emergency Department (HOSPITAL_COMMUNITY)
Admission: EM | Admit: 2017-09-11 | Discharge: 2017-09-11 | Disposition: A | Discharge status: Home / Self Care | Payer: Self-pay | Attending: Emergency Medicine | Admitting: Emergency Medicine

## 2017-09-11 ENCOUNTER — Other Ambulatory Visit: Payer: Self-pay

## 2017-09-11 ENCOUNTER — Encounter (HOSPITAL_COMMUNITY): Payer: Self-pay

## 2017-09-11 DIAGNOSIS — F17210 Nicotine dependence, cigarettes, uncomplicated: Secondary | ICD-10-CM | POA: Insufficient documentation

## 2017-09-11 DIAGNOSIS — M722 Plantar fascial fibromatosis: Secondary | ICD-10-CM | POA: Insufficient documentation

## 2017-09-11 DIAGNOSIS — Z79899 Other long term (current) drug therapy: Secondary | ICD-10-CM | POA: Insufficient documentation

## 2017-09-11 MED ORDER — IBUPROFEN 600 MG PO TABS: 600.0000 mg | ORAL_TABLET | Freq: Four times a day (QID) | ORAL | 0 refills | Status: DC | PRN

## 2017-09-11 NOTE — Discharge Instructions (Signed)
Your exam is consistent with plantar fasciitis.  I provided a handout on this. Plantar fasciitis is a painful foot condition that affects the heel. It occurs when the band of tissue that connects the toes to the heel bone (plantar fascia) becomes irritated. This can happen after exercising too much or doing other repetitive activities (overuse injury). The pain from plantar fasciitis can range from mild irritation to severe pain that makes it difficult for you to walk or move. The pain is usually worse in the morning or after you have been sitting or lying down for a while.  Please perform stretches provided and plantar fasciitis rehab-sports medicine handout at least 1-2 times per day.  This is especially helpful after periods of rest.  I provided you with a discount card for ibuprofen.  He can take this additionally for symptom relief.  Please establish care with the wellness center.  Return for any fever, joint swelling, overlying erythema, inability to move the ankles or other new concerning symptoms.

## 2017-09-11 NOTE — ED Provider Notes (Signed)
[name] Provider Note   CSN: [id number] Arrival date & time: 09/11/17  0806     History   Chief Complaint Chief Complaint  Patient presents with  . Foot Pain    HPI [name] is a 32 y.o. male with no significant past medical history is currently homeless presenting to the emergency department today for bilateral foot pain.  Patient states that he only has 1 pair of shoes and wears them 24 hours/day.  Notes over the last several weeks whenever he wakes up in the mornings and walks he has pain that goes from the bottom of his heel to his arches bilaterally.  This is worse with ambulation.  He notes that the pain improves after long periods of walking.  The pain returns after long periods of rest and restarting of walking.  He is not taking anything over-the-counter he is unable to afford this.  He denies any trauma, fever, joint swelling, joint erythema, open wounds, numbness/tingling, weakness or any other symptoms at this time.  HPI  History reviewed. No pertinent past medical history.  There are no active problems to display for this patient.   History reviewed. No pertinent surgical history.     Home Medications    Prior to Admission medications   Medication Sig Start Date End Date Taking? Authorizing Provider  ibuprofen (ADVIL,MOTRIN) 800 MG tablet Take 1 tablet (800 mg total) by mouth 3 (three) times daily. 05/11/16   Danelle Berryapia, Leisa, PA-C  naproxen (NAPROSYN) 500 MG tablet Take 1 tablet (500 mg total) by mouth 2 (two) times daily. Patient not taking: Reported on 05/11/2016 01/16/16   Anselm PancoastJoy, Shawn C, PA-C  omeprazole (PRILOSEC) 20 MG capsule Take 1 capsule (20 mg total) by mouth daily. 05/07/16   Horton, Mayer Maskerourtney F, MD  oxyCODONE-acetaminophen (PERCOCET/ROXICET) 5-325 MG tablet Take 1 tablet by mouth every 6 (six) hours as needed for severe pain. Patient not taking: Reported on 05/11/2016 01/16/16   Joy, Ines BloomerShawn C, PA-C  traMADol (ULTRAM)  50 MG tablet Take 1 tablet (50 mg total) by mouth every 12 (twelve) hours as needed for severe pain. 05/11/16   Danelle Berryapia, Leisa, PA-C  diphenhydrAMINE (BENADRYL) 25 MG tablet Take 1 tablet (25 mg total) by mouth every 6 (six) hours as needed for itching. Patient not taking: Reported on 01/16/2016 11/07/15 01/16/16  Fayrene Helperran, Bowie, PA-C  furosemide (LASIX) 20 MG tablet 1 by mouth every morning as needed for swelling. Patient not taking: Reported on 01/16/2016 06/24/15 01/16/16  Rolland PorterJames, Mark, MD    Family History Family History  Problem Relation Age of Onset  . Diabetes Mother   . Hypertension Mother   . Cancer Mother   . Cancer Father   . Diabetes Father   . Hypertension Father     Social History Social History   Tobacco Use  . Smoking status: Current Every Day Smoker    Packs/day: 0.50    Years: 10.00    Pack years: 5.00    Types: Cigarettes  . Smokeless tobacco: Never Used  Substance Use Topics  . Alcohol use: No  . Drug use: No    Comment: [place]     Allergies   Patient has no known allergies.   Review of Systems Review of Systems  All other systems reviewed and are negative.    Physical Exam Updated Vital Signs BP 111/72   Pulse 81   Temp 98.2 F (36.8 C) (Oral)   Resp 18   Ht 6'  1" (1.854 m)   Wt 68 kg (150 lb)   SpO2 100%   BMI 19.79 kg/m   Physical Exam  Constitutional: He appears well-developed and well-nourished.  HENT:  Head: Normocephalic and atraumatic.  Right Ear: External ear normal.  Left Ear: External ear normal.  Eyes: Conjunctivae are normal. Right eye exhibits no discharge. Left eye exhibits no discharge. No scleral icterus.  Cardiovascular:  Pulses:      Dorsalis pedis pulses are 2+ on the right side, and 2+ on the left side.       Posterior tibial pulses are 2+ on the right side, and 2+ on the left side.  No lower extremity edema or swelling.  Calves are nontender to palpation bilaterally.   Pulmonary/Chest: Effort normal. No respiratory  distress.  Musculoskeletal:       Right ankle: Normal.       Left ankle: Normal. Achilles tendon normal.       Feet:  Feet exam: No joint swelling, erythema, heat, induration or drainage.  Inspection of foot thoroughly including between webspaces reveals no skin changes, ulcerations, infectious changes or evidence of tinea.  Patient with tenderness palpation of the bottom of the foot, greatest on the arch bilaterally. Patient is noted to have fallen arches bilaterally.  No deformity noted.  No bony tenderness to palpation.  Dorsalis pedis and posterior tib pulses 2+ bilaterally.  Cap refill less than 2 seconds.  Moves ankle indorsiflexion, plantarflexion, inversion and eversion as well as all digits without pain or difficulty b/l.  Neurological: He is alert.  Skin: No pallor.  Psychiatric: He has a normal mood and affect.  Nursing note and vitals reviewed.    ED Treatments / Results  Labs (all labs ordered are listed, but only abnormal results are displayed) Labs Reviewed - No data to display  EKG  EKG Interpretation None       Radiology No results found.  Procedures Procedures (including critical care time)  Medications Ordered in ED Medications - No data to display   Initial Impression / Assessment and Plan / ED Course  I have reviewed the triage vital signs and the nursing notes.  Pertinent labs & imaging results that were available during my care of the patient were reviewed by me and considered in my medical decision making (see chart for details).     32 y.o. male presenting with pain on the bottom of his feet that is worse after periods of rest when he begins ambulation.  Exam is consistent with plantar fasciitis.  Not suspect bilateral septic joint, DVT or other emergent cause at this time. No evidence of infection, or achilles tendon injury. Patient without history of trauma or bony tenderness on palpation that would warrant x-ray at this current time.  Will  provide the patient with educational handout, stretches, conservative therapy and good Rx card for over-the-counter medication.  Referral provided to the wellness clinic for close follow-up.  Return precautions discussed.  Patient appears safe for discharge.  Final Clinical Impressions(s) / ED Diagnoses   Final diagnoses:  Plantar fasciitis, bilateral    ED Discharge Orders        Ordered    ibuprofen (ADVIL,MOTRIN) 600 MG tablet  Every 6 hours PRN     09/11/17 1042       Princella Pellegrini 09/11/17 1043    Charlynne Pander, MD 09/11/17 [phone]

## 2017-09-11 NOTE — ED Triage Notes (Addendum)
Pt presents to the ed for complaints of foot pain bilaterally on the inside of each foot at the arch, denies any trauma. Denies numbness, tingling or discoloration. Swelling present. Pt reports being homeless and walking a lot.

## 2017-09-11 NOTE — ED Notes (Signed)
Provided patient with [place] sandwich and bus pass. Patient verbalized understanding of discharge instructions and denies any further needs or questions at this time. VS stable. Patient ambulatory with steady gait.

## 2017-09-21 ENCOUNTER — Emergency Department (HOSPITAL_COMMUNITY)
Admission: EM | Admit: 2017-09-21 | Discharge: 2017-09-21 | Disposition: A | Discharge status: Home / Self Care | Payer: Self-pay | Attending: Emergency Medicine | Admitting: Emergency Medicine

## 2017-09-21 ENCOUNTER — Emergency Department (HOSPITAL_COMMUNITY): Payer: Self-pay

## 2017-09-21 ENCOUNTER — Other Ambulatory Visit: Payer: Self-pay

## 2017-09-21 ENCOUNTER — Encounter (HOSPITAL_COMMUNITY): Payer: Self-pay | Admitting: Emergency Medicine

## 2017-09-21 DIAGNOSIS — M549 Dorsalgia, unspecified: Secondary | ICD-10-CM | POA: Insufficient documentation

## 2017-09-21 DIAGNOSIS — Z5321 Procedure and treatment not carried out due to patient leaving prior to being seen by health care provider: Secondary | ICD-10-CM | POA: Insufficient documentation

## 2017-09-21 DIAGNOSIS — R52 Pain, unspecified: Secondary | ICD-10-CM

## 2017-09-21 NOTE — ED Triage Notes (Signed)
Pt c/o 5/10 back pain for about a month denies any injury.

## 2017-09-21 NOTE — ED Notes (Signed)
Pt not found in the lobby 

## 2017-09-22 ENCOUNTER — Other Ambulatory Visit: Payer: Self-pay

## 2017-09-22 ENCOUNTER — Encounter (HOSPITAL_COMMUNITY): Payer: Self-pay | Admitting: Emergency Medicine

## 2017-09-22 ENCOUNTER — Emergency Department (HOSPITAL_COMMUNITY)
Admission: EM | Admit: 2017-09-22 | Discharge: 2017-09-22 | Disposition: A | Discharge status: Home / Self Care | Payer: Self-pay | Attending: Emergency Medicine | Admitting: Emergency Medicine

## 2017-09-22 DIAGNOSIS — Z79899 Other long term (current) drug therapy: Secondary | ICD-10-CM | POA: Insufficient documentation

## 2017-09-22 DIAGNOSIS — M545 Low back pain: Secondary | ICD-10-CM | POA: Insufficient documentation

## 2017-09-22 DIAGNOSIS — G8929 Other chronic pain: Secondary | ICD-10-CM | POA: Insufficient documentation

## 2017-09-22 DIAGNOSIS — F17210 Nicotine dependence, cigarettes, uncomplicated: Secondary | ICD-10-CM | POA: Insufficient documentation

## 2017-09-22 MED ORDER — TRAMADOL HCL 50 MG PO TABS: 50.0000 mg | ORAL_TABLET | Freq: Four times a day (QID) | ORAL | 0 refills | Status: DC | PRN

## 2017-09-22 MED ORDER — NAPROXEN 500 MG PO TABS: 500.0000 mg | ORAL_TABLET | Freq: Two times a day (BID) | ORAL | 0 refills | Status: DC

## 2017-09-22 NOTE — ED Triage Notes (Signed)
Patient reports chronic low/mid back pain for several years , denies injury/no urinary discomfort .

## 2017-09-22 NOTE — ED Notes (Signed)
Pt stepped outside to smoke

## 2017-09-22 NOTE — Discharge Instructions (Signed)
Naproxen as prescribed.  Tramadol as prescribed as needed for pain not relieved with naproxen.  Follow-up with your primary doctor if symptoms are not improving in the next 1-2 weeks.

## 2017-09-22 NOTE — ED Notes (Signed)
Pt given 3 sandwich upon DC. Pt asked for bus pass but none left.

## 2017-09-22 NOTE — ED Provider Notes (Signed)
[name] EMERGENCY DEPARTMENT Provider Note   CSN: [id number] Arrival date & time: 09/22/17  0440     History   Chief Complaint Chief Complaint  Patient presents with  . Back Pain    HPI [name] is a 32 y.o. male.  Patient is a 32 year old male   The history is provided by the patient.  Back Pain   This is a chronic problem. Episode onset: Worse over the past 2 weeks. The problem occurs constantly. The problem has been gradually worsening. The pain is associated with no known injury. The pain is present in the lumbar spine. The quality of the pain is described as stabbing. The pain does not radiate. The pain is moderate. The symptoms are aggravated by bending.    History reviewed. No pertinent past medical history.  There are no active problems to display for this patient.   History reviewed. No pertinent surgical history.     Home Medications    Prior to Admission medications   Medication Sig Start Date End Date Taking? Authorizing Provider  ibuprofen (ADVIL,MOTRIN) 600 MG tablet Take 1 tablet (600 mg total) by mouth every 6 (six) hours as needed. 09/11/17   Maczis, Elmer Sow, PA-C  naproxen (NAPROSYN) 500 MG tablet Take 1 tablet (500 mg total) by mouth 2 (two) times daily. Patient not taking: Reported on 05/11/2016 01/16/16   Anselm Pancoast, PA-C  omeprazole (PRILOSEC) 20 MG capsule Take 1 capsule (20 mg total) by mouth daily. 05/07/16   Horton, Mayer Masker, MD  oxyCODONE-acetaminophen (PERCOCET/ROXICET) 5-325 MG tablet Take 1 tablet by mouth every 6 (six) hours as needed for severe pain. Patient not taking: Reported on 05/11/2016 01/16/16   Joy, Ines Bloomer C, PA-C  traMADol (ULTRAM) 50 MG tablet Take 1 tablet (50 mg total) by mouth every 12 (twelve) hours as needed for severe pain. 05/11/16   Danelle Berry, PA-C    Family History Family History  Problem Relation Age of Onset  . Diabetes Mother   . Hypertension Mother   . Cancer Mother   . Cancer  Father   . Diabetes Father   . Hypertension Father     Social History Social History   Tobacco Use  . Smoking status: Current Every Day Smoker    Packs/day: 0.50    Years: 10.00    Pack years: 5.00    Types: Cigarettes  . Smokeless tobacco: Never Used  Substance Use Topics  . Alcohol use: No  . Drug use: Unknown    Types: Marijuana, Methamphetamines, Cocaine, IV    Comment: Herion     Allergies   Patient has no known allergies.   Review of Systems Review of Systems  Musculoskeletal: Positive for back pain.  All other systems reviewed and are negative.    Physical Exam Updated Vital Signs BP 114/80 (BP Location: Right Arm)   Pulse 81   Temp 98 F (36.7 C) (Oral)   Resp 16   Ht 5\' 9"  (1.753 m)   Wt 63.5 kg (140 lb)   SpO2 99%   BMI 20.67 kg/m   Physical Exam  Constitutional: He is oriented to person, place, and time. He appears well-developed and well-nourished. No distress.  HENT:  Head: Normocephalic and atraumatic.  Neck: Normal range of motion. Neck supple.  Pulmonary/Chest: Effort normal.  Musculoskeletal: Normal range of motion.  There is tenderness to palpation of the soft tissues in the lumbar region.  There is no bony tenderness or step-off.  Neurological: He is alert and oriented to person, place, and time.  Strength is 5 out of 5 in both lower extremities.  DTRs are trace and symmetrical in both lower extremities.  He is able to walk on his heels and toes without difficulty.  Skin: Skin is warm and dry. He is not diaphoretic.  Nursing note and vitals reviewed.    ED Treatments / Results  Labs (all labs ordered are listed, but only abnormal results are displayed) Labs Reviewed - No data to display  EKG  EKG Interpretation None       Radiology Dg Lumbar Spine Complete  Result Date: 09/21/2017 CLINICAL DATA:  Mid to low back pain EXAM: LUMBAR SPINE - COMPLETE 4+ VIEW COMPARISON:  None. FINDINGS: Lumbar alignment within normal limits.  Vertebral body heights are maintained. Disc spaces are within normal limits. IMPRESSION: Negative. Electronically Signed   By: Jasmine PangKim  Fujinaga M.D.   On: 09/21/2017 21:47    Procedures Procedures (including critical care time)  Medications Ordered in ED Medications - No data to display   Initial Impression / Assessment and Plan / ED Course  I have reviewed the triage vital signs and the nursing notes.  Pertinent labs & imaging results that were available during my care of the patient were reviewed by me and considered in my medical decision making (see chart for details).  Patient presents with an acute exacerbation of chronic low back pain.  This began in the absence of any injury or trauma.  There are no red flags that would suggest an emergent situation.  His strength and reflexes are normal and symmetrical and there are no bowel or bladder complaints.  He will be discharged with anti-inflammatory medications, pain medicine, and follow-up as needed if not improving.  Final Clinical Impressions(s) / ED Diagnoses   Final diagnoses:  None    ED Discharge Orders    None       Geoffery Lyonselo, [place] Segler, MD 09/22/17 [id number]

## 2017-09-29 ENCOUNTER — Emergency Department (HOSPITAL_COMMUNITY)
Admission: EM | Admit: 2017-09-29 | Discharge: 2017-09-29 | Disposition: A | Discharge status: Home / Self Care | Payer: Self-pay | Attending: Emergency Medicine | Admitting: Emergency Medicine

## 2017-09-29 ENCOUNTER — Other Ambulatory Visit: Payer: Self-pay

## 2017-09-29 ENCOUNTER — Encounter (HOSPITAL_COMMUNITY): Payer: Self-pay | Admitting: Emergency Medicine

## 2017-09-29 DIAGNOSIS — L739 Follicular disorder, unspecified: Secondary | ICD-10-CM | POA: Insufficient documentation

## 2017-09-29 DIAGNOSIS — Z5321 Procedure and treatment not carried out due to patient leaving prior to being seen by health care provider: Secondary | ICD-10-CM | POA: Insufficient documentation

## 2017-09-29 DIAGNOSIS — F17210 Nicotine dependence, cigarettes, uncomplicated: Secondary | ICD-10-CM | POA: Insufficient documentation

## 2017-09-29 DIAGNOSIS — Z79899 Other long term (current) drug therapy: Secondary | ICD-10-CM | POA: Insufficient documentation

## 2017-09-29 DIAGNOSIS — R21 Rash and other nonspecific skin eruption: Secondary | ICD-10-CM | POA: Insufficient documentation

## 2017-09-29 MED ORDER — IBUPROFEN 800 MG PO TABS
800.0000 mg | ORAL_TABLET | Freq: Once | ORAL | Status: AC
  Administered 2017-09-29: 800 mg via ORAL
  Filled 2017-09-29: qty 1

## 2017-09-29 MED ORDER — CEPHALEXIN 250 MG PO CAPS: 250.0000 mg | ORAL_CAPSULE | Freq: Four times a day (QID) | ORAL | 0 refills | Status: DC

## 2017-09-29 MED ORDER — IBUPROFEN 600 MG PO TABS: 600.0000 mg | ORAL_TABLET | Freq: Three times a day (TID) | ORAL | 0 refills | Status: DC | PRN

## 2017-09-29 NOTE — ED Triage Notes (Signed)
Pt c/o several bumps on head that hurt. Patient was sleeping in lobby and when woke up to be made to leave patient checked back in.

## 2017-09-29 NOTE — ED Triage Notes (Signed)
Pt called to take to treatment room  No response from lobby 

## 2017-09-29 NOTE — ED Notes (Signed)
Pt called from lobby, no responce 

## 2017-09-29 NOTE — ED Provider Notes (Signed)
[facility]-EMERGENCY DEPT Provider Note   CSN: [id number] Arrival date & time: 09/29/17  [id number]     History   Chief Complaint Chief Complaint  Patient presents with  . bump on head    HPI [name] is a 32 y.o. male presenting for evaluation of bumps on his head.  Patient states that for the past 2 days, he has had painful bumps on the top of his head.  They do not itch.  There has been no drainage.  He denies history of similar.  He has 3 discrete bumps, which are not worsening or improving.  They appeared at the same time.  He denies new environments, close, hats, or pillows.  He denies new shampoo, conditioner, detergents.  He states he has no medical problems, does not take medications daily.  He denies bumps elsewhere on his body.  He denies contacts with similar bumps.  He denies fevers, chills, headache, sore throat, cough, nausea, vomiting.  HPI  History reviewed. No pertinent past medical history.  There are no active problems to display for this patient.   History reviewed. No pertinent surgical history.     Home Medications    Prior to Admission medications   Medication Sig Start Date End Date Taking? Authorizing Provider  cephALEXin (KEFLEX) 250 MG capsule Take 1 capsule (250 mg total) by mouth 4 (four) times daily. 09/29/17   Issa Luster, PA-C  ibuprofen (ADVIL,MOTRIN) 600 MG tablet Take 1 tablet (600 mg total) by mouth every 8 (eight) hours as needed. 09/29/17   Jeanell Mangan, PA-C  naproxen (NAPROSYN) 500 MG tablet Take 1 tablet (500 mg total) by mouth 2 (two) times daily. 09/22/17   Geoffery Lyonselo, Douglas, MD  omeprazole (PRILOSEC) 20 MG capsule Take 1 capsule (20 mg total) by mouth daily. 05/07/16   Horton, Mayer Maskerourtney F, MD  oxyCODONE-acetaminophen (PERCOCET/ROXICET) 5-325 MG tablet Take 1 tablet by mouth every 6 (six) hours as needed for severe pain. Patient not taking: Reported on 05/11/2016 01/16/16   Joy, Ines BloomerShawn C, PA-C  traMADol (ULTRAM) 50  MG tablet Take 1 tablet (50 mg total) by mouth every 6 (six) hours as needed. 09/22/17   Geoffery Lyonselo, Douglas, MD    Family History Family History  Problem Relation Age of Onset  . Diabetes Mother   . Hypertension Mother   . Cancer Mother   . Cancer Father   . Diabetes Father   . Hypertension Father     Social History Social History   Tobacco Use  . Smoking status: Current Every Day Smoker    Packs/day: 0.50    Years: 10.00    Pack years: 5.00    Types: Cigarettes  . Smokeless tobacco: Never Used  Substance Use Topics  . Alcohol use: No  . 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   Review of Systems Review of Systems  Constitutional: Negative for chills and fever.  Skin: Positive for rash.     Physical Exam Updated Vital Signs BP 125/73 (BP Location: Right Arm)   Pulse 83   Temp 97.9 F (36.6 C) (Oral)   Resp 18   Ht 6\' 1"  (1.854 m)   Wt 68 kg (150 lb)   SpO2 100%   BMI 19.79 kg/m   Physical Exam  Constitutional: He is oriented to person, place, and time. He appears well-developed and well-nourished. No distress.  HENT:  Head: Normocephalic and atraumatic.  Eyes: EOM are normal.  Neck: Normal range of motion.  Pulmonary/Chest: Effort normal.  Abdominal: He exhibits no distension.  Musculoskeletal: Normal range of motion.  Neurological: He is alert and oriented to person, place, and time.  Skin: Skin is warm. Rash noted.  3 discrete small macular lesions on the top of the scalp where the hair meets the skin.  No pustules or drainage.  No lesions noted elsewhere.  Psychiatric: He has a normal mood and affect.  Nursing note and vitals reviewed.    ED Treatments / Results  Labs (all labs ordered are listed, but only abnormal results are displayed) Labs Reviewed - No data to display  EKG  EKG Interpretation None       Radiology No results found.  Procedures Procedures (including  critical care time)  Medications Ordered in ED Medications  ibuprofen (ADVIL,MOTRIN) tablet 800 mg (not administered)     Initial Impression / Assessment and Plan / ED Course  I have reviewed the triage vital signs and the nursing notes.  Pertinent labs & imaging results that were available during my care of the patient were reviewed by me and considered in my medical decision making (see chart for details).     Patient presenting for evaluation of bumps on his scalp.  Physical exam shows 3 red and painful bumps where there is hair meets the scalp, ?folliculitis.  Likely only inflammatory, however may be early infection.  Will treat with ibuprofen and Keflex. No sign of systemic infection. Patient given information to follow-up with primary care as needed.  Information for [facility] and wellness provided.  At this time, patient appears safe for discharge.  Return precautions given.  Patient states he understands and agrees to plan.  Final Clinical Impressions(s) / ED Diagnoses   Final diagnoses:  Folliculitis    ED Discharge Orders        Ordered    ibuprofen (ADVIL,MOTRIN) 600 MG tablet  Every 8 hours PRN     09/29/17 0939    cephALEXin (KEFLEX) 250 MG capsule  4 times daily     09/29/17 0939       Alveria Apley, PA-C 09/29/17 [id number]    Azalia Bilis, MD 09/29/17 1018

## 2017-09-29 NOTE — ED Notes (Signed)
Pt called  No response from lobby  

## 2017-09-29 NOTE — ED Triage Notes (Signed)
Pt states he has bumps on his head and he does not know what they are from  States he noticed them 2 days ago

## 2017-09-29 NOTE — Discharge Instructions (Signed)
Take ibuprofen 3 times a day with meals.  Do not take other anti-inflammatories at the same time open (Advil, Motrin, naproxen, Aleve). You may supplement with Tylenol if you need further pain control. Take antibiotics as prescribed. Take the entire course, even if your symptoms improve.  Follow up with [facility] to establish primary care.  Return to the ER if you develop fevers, worsening symptoms, or any new or concerning symptoms.

## 2017-10-13 DIAGNOSIS — Z202 Contact with and (suspected) exposure to infections with a predominantly sexual mode of transmission: Secondary | ICD-10-CM | POA: Insufficient documentation

## 2017-10-13 DIAGNOSIS — N481 Balanitis: Secondary | ICD-10-CM | POA: Insufficient documentation

## 2017-10-13 DIAGNOSIS — F17210 Nicotine dependence, cigarettes, uncomplicated: Secondary | ICD-10-CM | POA: Insufficient documentation

## 2017-10-14 ENCOUNTER — Other Ambulatory Visit: Payer: Self-pay

## 2017-10-14 ENCOUNTER — Emergency Department (HOSPITAL_COMMUNITY): Admission: EM | Admit: 2017-10-14 | Discharge: 2017-10-14 | Discharge status: Against Medical Advice | Payer: Self-pay

## 2017-10-14 ENCOUNTER — Encounter (HOSPITAL_COMMUNITY): Payer: Self-pay | Admitting: Emergency Medicine

## 2017-10-14 ENCOUNTER — Emergency Department (HOSPITAL_COMMUNITY)
Admission: EM | Admit: 2017-10-14 | Discharge: 2017-10-14 | Disposition: A | Discharge status: Home / Self Care | Payer: Self-pay | Attending: Emergency Medicine | Admitting: Emergency Medicine

## 2017-10-14 DIAGNOSIS — N481 Balanitis: Secondary | ICD-10-CM

## 2017-10-14 MED ORDER — STERILE WATER FOR INJECTION IJ SOLN
INTRAMUSCULAR | Status: AC
  Administered 2017-10-14: 1.2 mL
  Filled 2017-10-14: qty 10

## 2017-10-14 MED ORDER — AZITHROMYCIN 250 MG PO TABS
1000.0000 mg | ORAL_TABLET | Freq: Once | ORAL | Status: AC
  Administered 2017-10-14: 1000 mg via ORAL
  Filled 2017-10-14: qty 4

## 2017-10-14 MED ORDER — METRONIDAZOLE 500 MG PO TABS
2000.0000 mg | ORAL_TABLET | Freq: Once | ORAL | Status: AC
  Administered 2017-10-14: 2000 mg via ORAL
  Filled 2017-10-14: qty 4

## 2017-10-14 MED ORDER — CEFTRIAXONE SODIUM 250 MG IJ SOLR
250.0000 mg | Freq: Once | INTRAMUSCULAR | Status: AC
  Administered 2017-10-14: 250 mg via INTRAMUSCULAR
  Filled 2017-10-14: qty 250

## 2017-10-14 NOTE — Discharge Instructions (Addendum)
You may alternate Tylenol 1000 mg every 6 hours as needed for pain and Ibuprofen 800 mg every 8 hours as needed for pain.  Please take Ibuprofen with food.  You have been treated for gonorrhea and chlamydia.  Your gonorrhea and chlamydia tests are pending.  You may follow-up on the results in my chart.  Please avoid sexual intercourse for the next week.  If you are positive for a sexually transmitted diseases, you and your recent partners will need to be treated.  You may follow-up at the health department for testing for HIV, hepatitis and syphilis.

## 2017-10-14 NOTE — ED Notes (Signed)
Pt called for triage, no response from the lobby. 

## 2017-10-14 NOTE — ED Notes (Signed)
Called patient and no answer.

## 2017-10-14 NOTE — ED Triage Notes (Signed)
Patient is complaining of having an std.

## 2017-10-14 NOTE — ED Provider Notes (Signed)
TIME SEEN: 3:36 AM  CHIEF COMPLAINT: Concern for STD exposure  HPI: Patient is a 32 year old male with previous history of STDs who presents to the emergency department with complaints of swelling to the distal shaft of his penis and the glans.  States that he is concerned he could have been exposed to an STD.  He is sexually active with multiple male partners and does not use protection.  No dysuria or hematuria.  No testicular pain or swelling.  No fevers, abdominal pain, vomiting or diarrhea.  States he has had STDs in the past but cannot remember what he has had.  He is requesting treatment for STDs today.  ROS: See HPI Constitutional: no fever  Eyes: no drainage  ENT: no runny nose   Cardiovascular:  no chest pain  Resp: no SOB  GI: no vomiting GU: no dysuria Integumentary: no rash  Allergy: no hives  Musculoskeletal: no leg swelling  Neurological: no slurred speech ROS otherwise negative  PAST MEDICAL HISTORY/PAST SURGICAL HISTORY:  History reviewed. No pertinent past medical history.  MEDICATIONS:  Prior to Admission medications   Medication Sig Start Date End Date Taking? Authorizing Provider  cephALEXin (KEFLEX) 250 MG capsule Take 1 capsule (250 mg total) by mouth 4 (four) times daily. Patient not taking: Reported on 10/14/2017 09/29/17   Caccavale, Sophia, PA-C  ibuprofen (ADVIL,MOTRIN) 600 MG tablet Take 1 tablet (600 mg total) by mouth every 8 (eight) hours as needed. Patient not taking: Reported on 10/14/2017 09/29/17   Caccavale, Sophia, PA-C  naproxen (NAPROSYN) 500 MG tablet Take 1 tablet (500 mg total) by mouth 2 (two) times daily. Patient not taking: Reported on 10/14/2017 09/22/17   Geoffery Lyonselo, Douglas, MD  omeprazole (PRILOSEC) 20 MG capsule Take 1 capsule (20 mg total) by mouth daily. Patient not taking: Reported on 10/14/2017 05/07/16   Horton, Mayer Maskerourtney F, MD  oxyCODONE-acetaminophen (PERCOCET/ROXICET) 5-325 MG tablet Take 1 tablet by mouth every 6 (six) hours as needed for  severe pain. Patient not taking: Reported on 05/11/2016 01/16/16   Joy, Ines BloomerShawn C, PA-C  traMADol (ULTRAM) 50 MG tablet Take 1 tablet (50 mg total) by mouth every 6 (six) hours as needed. Patient not taking: Reported on 10/14/2017 09/22/17   Geoffery Lyonselo, [place], MD    ALLERGIES:  No Known Allergies  SOCIAL HISTORY:  Social History   Tobacco Use  . Smoking status: Current Every Day Smoker    Packs/day: 0.50    Years: 10.00    Pack years: 5.00    Types: Cigarettes  . Smokeless tobacco: Never Used  Substance Use Topics  . Alcohol use: No    FAMILY HISTORY: Family History  Problem Relation Age of Onset  . Diabetes Mother   . Hypertension Mother   . Cancer Mother   . Cancer Father   . Diabetes Father   . Hypertension Father     EXAM: BP 106/70 (BP Location: Right Arm)   Pulse 80   Temp 97.9 F (36.6 C) (Oral)   Resp 18   Ht 6\' 1"  (1.854 m)   Wt 68 kg (150 lb)   SpO2 100%   BMI 19.79 kg/m  CONSTITUTIONAL: Alert and oriented and responds appropriately to questions. Well-appearing; well-nourished HEAD: Normocephalic EYES: Conjunctivae clear, pupils appear equal, EOMI ENT: normal nose; moist mucous membranes NECK: Supple, no meningismus, no nuchal rigidity, no LAD  CARD: RRR; S1 and S2 appreciated; no murmurs, no clicks, no rubs, no gallops RESP: Normal chest excursion without splinting or tachypnea; breath  sounds clear and equal bilaterally; no wheezes, no rhonchi, no rales, no hypoxia or respiratory distress, speaking full sentences ABD/GI: Normal bowel sounds; non-distended; soft, non-tender, no rebound, no guarding, no peritoneal signs, no hepatosplenomegaly GU:  Normal external genitalia other than having mild inflammation at the distal shaft of the penis and around the glans without redness or warmth or rash or ulcers, circumcised male, normal penile shaft, no blood or discharge at the urethral meatus, no testicular masses or tenderness on exam, no scrotal masses or swelling, no  hernias appreciated, 2+ femoral pulses bilaterally; no perineal erythema, warmth, subcutaneous air or crepitus; no high riding testicle, normal bilateral cremasteric reflex.  Chaperone present for exam. BACK:  The back appears normal and is non-tender to palpation, there is no CVA tenderness EXT: Normal ROM in all joints; non-tender to palpation; no edema; normal capillary refill; no cyanosis, no calf tenderness or swelling    SKIN: Normal color for age and race; warm; no rash NEURO: Moves all extremities equally PSYCH: The patient's mood and manner are appropriate. Grooming and personal hygiene are appropriate.  MEDICAL DECISION MAKING: Patient here with what appears to be very mild balanitis.  No sign of candidiasis.  Will treat for gonorrhea, chlamydia and trichomonas today.  I have given him the health department follow-up information and I recommend he follow-up at their free STD clinic for HIV, hepatitis and syphilis testing.  He has no urinary symptoms.  No testicular pain or swelling.  Doubt torsion, epididymitis.  Otherwise well-appearing.  Have advised him to avoid sexual intercourse for the next 2 weeks.  He can follow-up on his results for gonorrhea and Chlamydia testing on my chart.  Have recommended that he use protection at all times.  Discussed return precautions.  At this time, I do not feel there is any life-threatening condition present. I have reviewed and discussed all results (EKG, imaging, lab, urine as appropriate) and exam findings with patient/family. I have reviewed nursing notes and appropriate previous records.  I feel the patient is safe to be discharged home without further emergent workup and can continue workup as an outpatient as needed. Discussed usual and customary return precautions. Patient/family verbalize understanding and are comfortable with this plan.  Outpatient follow-up has been provided if needed. All questions have been answered.      Bari Handshoe, Layla Maw,  DO 10/14/17 [phone]

## 2017-10-16 LAB — GC/CHLAMYDIA PROBE AMP (~~LOC~~) NOT AT ARMC
Chlamydia: NEGATIVE
NEISSERIA GONORRHEA: NEGATIVE

## 2017-12-08 ENCOUNTER — Emergency Department (HOSPITAL_COMMUNITY)
Admission: EM | Admit: 2017-12-08 | Discharge: 2017-12-08 | Disposition: A | Discharge status: Home / Self Care | Payer: Self-pay | Attending: Emergency Medicine | Admitting: Emergency Medicine

## 2017-12-08 ENCOUNTER — Encounter (HOSPITAL_COMMUNITY): Payer: Self-pay | Admitting: Emergency Medicine

## 2017-12-08 DIAGNOSIS — G4709 Other insomnia: Secondary | ICD-10-CM | POA: Insufficient documentation

## 2017-12-08 DIAGNOSIS — F17210 Nicotine dependence, cigarettes, uncomplicated: Secondary | ICD-10-CM | POA: Insufficient documentation

## 2017-12-08 LAB — CBG MONITORING, ED: GLUCOSE-CAPILLARY: 83 mg/dL (ref 65–99)

## 2017-12-08 NOTE — ED Triage Notes (Signed)
Not sure why pt is here... He states "I've been at this huge gay ordeal for the past 2 days, things got really weird tonight so they dropped me off on [address] and I decided to walk here" Pt has no complaints. 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

## 2017-12-08 NOTE — ED Provider Notes (Signed)
[name] EMERGENCY DEPARTMENT Provider Note   CSN: [id number] Arrival date & time: 12/08/17  4098     History   Chief Complaint Chief Complaint  Patient presents with  . Checkup?    HPI [name] is a 32 y.o. male.  The history is provided by the patient.  Illness  This is a new problem. The current episode started 1 to 2 hours ago. The problem occurs constantly. The problem has not changed since onset.Pertinent negatives include no chest pain, no abdominal pain, no headaches and no shortness of breath. Nothing aggravates the symptoms. Nothing relieves the symptoms. He has tried nothing for the symptoms. The treatment provided no relief.  Hasn't slept because he was doing crystal meth.  Also hasn't eaten.  Has no pain or other complaints.    History reviewed. No pertinent past medical history.  There are no active problems to display for this patient.   History reviewed. No pertinent surgical history.      Home Medications    Prior to Admission medications   Medication Sig Start Date End Date Taking? Authorizing Provider  cephALEXin (KEFLEX) 250 MG capsule Take 1 capsule (250 mg total) by mouth 4 (four) times daily. Patient not taking: Reported on 10/14/2017 09/29/17   Caccavale, Sophia, PA-C  ibuprofen (ADVIL,MOTRIN) 600 MG tablet Take 1 tablet (600 mg total) by mouth every 8 (eight) hours as needed. Patient not taking: Reported on 10/14/2017 09/29/17   Caccavale, Sophia, PA-C  naproxen (NAPROSYN) 500 MG tablet Take 1 tablet (500 mg total) by mouth 2 (two) times daily. Patient not taking: Reported on 10/14/2017 09/22/17   Geoffery Lyons, MD  omeprazole (PRILOSEC) 20 MG capsule Take 1 capsule (20 mg total) by mouth daily. Patient not taking: Reported on 10/14/2017 05/07/16   Horton, Mayer Masker, MD  oxyCODONE-acetaminophen (PERCOCET/ROXICET) 5-325 MG tablet Take 1 tablet by mouth every 6 (six) hours as needed for severe pain. Patient not taking: Reported on  05/11/2016 01/16/16   Joy, Ines Bloomer C, PA-C  traMADol (ULTRAM) 50 MG tablet Take 1 tablet (50 mg total) by mouth every 6 (six) hours as needed. Patient not taking: Reported on 10/14/2017 09/22/17   Geoffery Lyons, MD    Family History Family History  Problem Relation Age of Onset  . Diabetes Mother   . Hypertension Mother   . Cancer Mother   . Cancer Father   . Diabetes Father   . Hypertension Father     Social History Social History   Tobacco Use  . Smoking status: Current Every Day Smoker    Packs/day: 0.50    Years: 10.00    Pack years: 5.00    Types: Cigarettes  . Smokeless tobacco: Never Used  Substance Use Topics  . Alcohol use: No    Frequency: Never  . Drug use: Yes    Types: Marijuana, Methamphetamines    Comment: Herion     Allergies   Patient has no known allergies.   Review of Systems Review of Systems  Constitutional: Negative for diaphoresis and fever.  HENT: Negative for facial swelling.   Respiratory: Negative for shortness of breath.   Cardiovascular: Negative for chest pain, palpitations and leg swelling.  Gastrointestinal: Negative for abdominal pain, nausea and vomiting.  Genitourinary: Negative for flank pain.  Musculoskeletal: Negative for myalgias, neck pain and neck stiffness.  Neurological: Negative for headaches.  Hematological: Negative for adenopathy.  Psychiatric/Behavioral: Negative for agitation, behavioral problems, confusion, decreased concentration, dysphoric mood, hallucinations, self-injury, sleep  disturbance and suicidal ideas. The patient is not nervous/anxious and is not hyperactive.   All other systems reviewed and are negative.    Physical Exam Updated Vital Signs BP (!) 123/96 (BP Location: Right Arm)   Pulse (!) 101   Temp 98.1 F (36.7 C) (Oral)   Resp 18   Ht  (1.854 m)   Wt 77.1 kg (170 lb)   SpO2 98%   BMI 22.43 kg/m   Physical Exam  Constitutional: He is oriented to person, place, and time. He appears  well-developed and well-nourished. No distress.  HENT:  Head: Normocephalic and atraumatic.  Right Ear: External ear normal.  Left Ear: External ear normal.  Nose: Nose normal.  Mouth/Throat: Oropharynx is clear and moist. No oropharyngeal exudate.  Eyes: Pupils are equal, round, and reactive to light. Conjunctivae and EOM are normal.  Neck: Normal range of motion. Neck supple. No tracheal deviation present.  Cardiovascular: Normal rate, regular rhythm, normal heart sounds and intact distal pulses.  Pulmonary/Chest: Effort normal and breath sounds normal. No stridor. He has no wheezes. He has no rales.  Abdominal: Soft. Bowel sounds are normal. He exhibits no mass. There is no tenderness. There is no rebound and no guarding.  Musculoskeletal: Normal range of motion.  Lymphadenopathy:    He has no cervical adenopathy.  Neurological: He is alert and oriented to person, place, and time. He displays normal reflexes.  Skin: Skin is warm and dry. Capillary refill takes less than 2 seconds.  Psychiatric: He has a normal mood and affect.     ED Treatments / Results  Labs (all labs ordered are listed, but only abnormal results are displayed) Labs Reviewed - No data to display  EKG None  Radiology No results found.  Procedures Procedures (including critical care time)  Medications Ordered in ED Medications - No data to display    Final Clinical Impressions(s) / ED Diagnoses   No acute medical issues, PO challenged in the ED.  Is able to walk.  Stable for discharge.     Return for weakness, numbness, changes in vision or speech, fevers >100.4 unrelieved by medication, shortness of breath, intractable vomiting, or diarrhea, abdominal pain, Inability to tolerate liquids or food, cough, altered mental status or any concerns. No signs of systemic illness or infection. The patient is nontoxic-appearing on exam and vital signs are within normal limits.   I have reviewed the triage  vital signs and the nursing notes. Pertinent labs &imaging results that were available during my care of the patient were reviewed by me and considered in my medical decision making (see chart for details).  After history, exam, and medical workup I feel the patient has been appropriately medically screened and is safe for discharge home. Pertinent diagnoses were discussed with the patient. Patient was given return precautions.       Rozell Theiler, MD 12/08/17 [id number]

## 2017-12-08 NOTE — ED Notes (Signed)
Pt give meal tray and ate well

## 2018-02-07 ENCOUNTER — Emergency Department (HOSPITAL_COMMUNITY)
Admission: EM | Admit: 2018-02-07 | Discharge: 2018-02-07 | Disposition: A | Discharge status: Home / Self Care | Payer: Self-pay | Attending: Emergency Medicine | Admitting: Emergency Medicine

## 2018-02-07 ENCOUNTER — Encounter (HOSPITAL_COMMUNITY): Payer: Self-pay | Admitting: Emergency Medicine

## 2018-02-07 DIAGNOSIS — M79621 Pain in right upper arm: Secondary | ICD-10-CM | POA: Insufficient documentation

## 2018-02-07 DIAGNOSIS — Z5321 Procedure and treatment not carried out due to patient leaving prior to being seen by health care provider: Secondary | ICD-10-CM | POA: Insufficient documentation

## 2018-02-07 DIAGNOSIS — R2231 Localized swelling, mass and lump, right upper limb: Secondary | ICD-10-CM | POA: Insufficient documentation

## 2018-02-07 NOTE — ED Notes (Signed)
Pt seen ambulatory from lobby by lobby staff but unable to speak to him before leaving

## 2018-02-07 NOTE — ED Triage Notes (Signed)
Pt presents with multiple bumps under R axilla x 1 wk; painful; no drainage; no fever

## 2018-02-07 NOTE — ED Notes (Signed)
Pt up to desk stating that he is leaving. Armband removed.

## 2018-02-12 ENCOUNTER — Encounter (HOSPITAL_COMMUNITY): Payer: Self-pay

## 2018-02-12 ENCOUNTER — Emergency Department (HOSPITAL_COMMUNITY)
Admission: EM | Admit: 2018-02-12 | Discharge: 2018-02-12 | Disposition: A | Discharge status: Home / Self Care | Payer: Self-pay | Attending: Emergency Medicine | Admitting: Emergency Medicine

## 2018-02-12 ENCOUNTER — Emergency Department (HOSPITAL_COMMUNITY): Payer: Self-pay

## 2018-02-12 DIAGNOSIS — F17210 Nicotine dependence, cigarettes, uncomplicated: Secondary | ICD-10-CM | POA: Insufficient documentation

## 2018-02-12 DIAGNOSIS — M25472 Effusion, left ankle: Secondary | ICD-10-CM | POA: Insufficient documentation

## 2018-02-12 NOTE — Discharge Instructions (Signed)
Apply ice several times a day.  Wear the ankle splint orthotic as needed.  Take naproxen (Aleve) - two tablets at a time, twice a day.

## 2018-02-12 NOTE — ED Provider Notes (Signed)
[name] EMERGENCY DEPARTMENT Provider Note   CSN: [id number] Arrival date & time: 02/12/18  0435     History   Chief Complaint Chief Complaint  Patient presents with  . Ankle Pain    HPI [name] is a 32 y.o. male.  The history is provided by the patient.  He states that he woke up about 30 minutes ago noted swelling in his left ankle.  Denies any pain.  He is worried that he got bitten on the ankle by something, or may have sprained it.  He does not recall any injury and does not recall being bitten.  He has been ambulatory without any difficulty.  History reviewed. No pertinent past medical history.  There are no active problems to display for this patient.   History reviewed. No pertinent surgical history.      Home Medications    Prior to Admission medications   Medication Sig Start Date End Date Taking? Authorizing Provider  cephALEXin (KEFLEX) 250 MG capsule Take 1 capsule (250 mg total) by mouth 4 (four) times daily. Patient not taking: Reported on 10/14/2017 09/29/17   Caccavale, Sophia, PA-C  ibuprofen (ADVIL,MOTRIN) 600 MG tablet Take 1 tablet (600 mg total) by mouth every 8 (eight) hours as needed. Patient not taking: Reported on 10/14/2017 09/29/17   Caccavale, Sophia, PA-C  naproxen (NAPROSYN) 500 MG tablet Take 1 tablet (500 mg total) by mouth 2 (two) times daily. Patient not taking: Reported on 10/14/2017 09/22/17   Geoffery Lyons, MD  omeprazole (PRILOSEC) 20 MG capsule Take 1 capsule (20 mg total) by mouth daily. Patient not taking: Reported on 10/14/2017 05/07/16   Horton, Mayer Masker, MD  oxyCODONE-acetaminophen (PERCOCET/ROXICET) 5-325 MG tablet Take 1 tablet by mouth every 6 (six) hours as needed for severe pain. Patient not taking: Reported on 05/11/2016 01/16/16   Joy, Ines Bloomer C, PA-C  traMADol (ULTRAM) 50 MG tablet Take 1 tablet (50 mg total) by mouth every 6 (six) hours as needed. Patient not taking: Reported on 10/14/2017 09/22/17   Geoffery Lyons, MD    Family History Family History  Problem Relation Age of Onset  . Diabetes Mother   . Hypertension Mother   . Cancer Mother   . Cancer Father   . Diabetes Father   . Hypertension Father     Social History Social History   Tobacco Use  . Smoking status: Current Every Day Smoker    Packs/day: 0.50    Years: 10.00    Pack years: 5.00    Types: Cigarettes  . Smokeless tobacco: Never Used  Substance Use Topics  . Alcohol use: No    Frequency: Never  . Drug use: Yes    Types: Marijuana, Methamphetamines    Comment: Herion     Allergies   Patient has no known allergies.   Review of Systems Review of Systems  All other systems reviewed and are negative.    Physical Exam Updated Vital Signs BP (!) 137/95 (BP Location: Left Arm)   Pulse 92   Temp 98.1 F (36.7 C) (Oral)   Resp 15   SpO2 100%   Physical Exam  Nursing note and vitals reviewed.  32 year old male, resting comfortably and in no acute distress. Vital signs are significant for borderline elevated blood pressure. Oxygen saturation is 100%, which is normal. Head is normocephalic and atraumatic. PERRLA, EOMI. Oropharynx is clear. Neck is nontender and supple without adenopathy or JVD. Back is nontender and there is  no CVA tenderness. Lungs are clear without rales, wheezes, or rhonchi. Chest is nontender. Heart has regular rate and rhythm without murmur. Abdomen is soft, flat, nontender without masses or hepatosplenomegaly and peristalsis is normoactive. Extremities: Mild erythema and swelling of the medial aspect of the left ankle.  There is minimal tenderness to palpation.  There is no instability of the ankle mortise and anterior drawer sign is negative.  Dorsalis pedis pulses strong.  Capillary refill is 3-4seconds.  Sensation is normal. Skin is warm and dry without rash. Neurologic: Mental status is normal, cranial nerves are intact, there are no motor or sensory deficits.  ED  Treatments / Results  Labs (all labs ordered are listed, but only abnormal results are displayed) Labs Reviewed - No data to display  EKG None  Radiology Dg Ankle Complete Left  Result Date: 02/12/2018 CLINICAL DATA:  Sudden onset of left ankle swelling.  Pain. EXAM: LEFT ANKLE COMPLETE - 3+ VIEW COMPARISON:  None. FINDINGS: There is no evidence of fracture, dislocation, or bony destructive change. There is no evidence of arthropathy or other focal bone abnormality. Small tibiotalar joint effusion suspected. Soft tissues are unremarkable. IMPRESSION: Suspect small tibial talar joint effusion.  No osseous abnormality. Electronically Signed   By: Rubye OaksMelanie  Ehinger M.D.   On: 02/12/2018 05:25    Procedures Procedures   Medications Ordered in ED Medications - No data to display   Initial Impression / Assessment and Plan / ED Course  I have reviewed the triage vital signs and the nursing notes.  Pertinent imaging results that were available during my care of the patient were reviewed by me and considered in my medical decision making (see chart for details).  Left ankle swelling with minimal tenderness.  X-rays are negative for fracture but do suggest small joint effusion.  Exam is benign.  Doubt gout without significant pain or tenderness.  No physical findings to suggest infection.  He is instructed to apply ice and keep it elevated.  Given an ankle splint orthotic to use as needed.  Told to use over-the-counter NSAIDs, return precautions discussed.  Final Clinical Impressions(s) / ED Diagnoses   Final diagnoses:  Swelling of left ankle joint    ED Discharge Orders    None       Dione BoozeGlick, Shaye Lagace, MD 02/12/18 [phone]

## 2018-02-12 NOTE — ED Triage Notes (Signed)
Pt has swelling to L ankle that happened suddenly, states he thinks he was bitten by something, denies injury

## 2018-02-12 NOTE — ED Notes (Addendum)
Patient verbalizes understanding of medications and discharge instructions. No further questions at this time. VSS and patient ambulatory at discharge.    Patient given Malaw[place]urkey sandwich and sprite.

## 2018-02-12 NOTE — ED Notes (Signed)
Patient refused ASO ankle  

## 2018-02-24 ENCOUNTER — Emergency Department (HOSPITAL_COMMUNITY): Admission: EM | Admit: 2018-02-24 | Discharge: 2018-02-24 | Discharge status: Against Medical Advice | Payer: Self-pay

## 2018-02-24 ENCOUNTER — Emergency Department (HOSPITAL_COMMUNITY): Admission: EM | Admit: 2018-02-24 | Payer: Self-pay | Source: Home / Self Care

## 2018-02-24 NOTE — ED Notes (Signed)
Pt called for triage with no response. 

## 2018-02-24 NOTE — ED Notes (Signed)
Called pt x2 for triage, no response. 

## 2018-02-24 NOTE — ED Triage Notes (Signed)
Pt stated "my feet hurt.... I need a bus pass."  When told we did not have any bus passes at this time he stated "then I'm leaving."

## 2018-02-24 NOTE — ED Notes (Signed)
Pt called for triage with no answer 

## 2018-02-24 NOTE — ED Notes (Signed)
Called pt for triage x3, no response when trying to locate in lobby.

## 2018-03-03 ENCOUNTER — Other Ambulatory Visit: Payer: Self-pay

## 2018-03-03 ENCOUNTER — Emergency Department (HOSPITAL_COMMUNITY): Admission: EM | Admit: 2018-03-03 | Discharge: 2018-03-03 | Discharge status: Against Medical Advice | Payer: Self-pay

## 2018-03-03 NOTE — ED Notes (Signed)
No answer for triage.

## 2018-03-03 NOTE — ED Notes (Signed)
Called for triage no answer x3 

## 2018-03-03 NOTE — ED Notes (Signed)
Called for triage. No answer x 1. 

## 2018-03-09 ENCOUNTER — Encounter (HOSPITAL_COMMUNITY): Payer: Self-pay | Admitting: *Deleted

## 2018-03-09 ENCOUNTER — Emergency Department (HOSPITAL_COMMUNITY)
Admission: EM | Admit: 2018-03-09 | Discharge: 2018-03-09 | Disposition: A | Discharge status: Home / Self Care | Payer: Self-pay | Attending: Emergency Medicine | Admitting: Emergency Medicine

## 2018-03-09 ENCOUNTER — Other Ambulatory Visit: Payer: Self-pay

## 2018-03-09 DIAGNOSIS — R21 Rash and other nonspecific skin eruption: Secondary | ICD-10-CM | POA: Insufficient documentation

## 2018-03-09 DIAGNOSIS — F17210 Nicotine dependence, cigarettes, uncomplicated: Secondary | ICD-10-CM | POA: Insufficient documentation

## 2018-03-09 DIAGNOSIS — L299 Pruritus, unspecified: Secondary | ICD-10-CM | POA: Insufficient documentation

## 2018-03-09 DIAGNOSIS — T7840XA Allergy, unspecified, initial encounter: Secondary | ICD-10-CM | POA: Insufficient documentation

## 2018-03-09 MED ORDER — HYDROCORTISONE 1 % EX CREA: TOPICAL_CREAM | CUTANEOUS | 0 refills | Status: DC

## 2018-03-09 NOTE — Discharge Instructions (Addendum)
You were seen in the emergency department today for a rash.  We are trying a topical steroid to help with this.  Please apply this to affected areas 2 times daily.  Please follow-up with your primary care doctor within 1 week.  If you do not have a primary care provider please see her [facility].  We have prescribed you new medication(s) today. Discuss the medications prescribed today with your pharmacist as they can have adverse effects and interactions with your other medicines including over the counter and prescribed medications. Seek medical evaluation if you start to experience new or abnormal symptoms after taking one of these medicines, seek care immediately if you start to experience difficulty breathing, feeling of your throat closing, facial swelling, or rash as these could be indications of a more serious allergic reaction  Return to the ER for new or worsening symptoms including but not limited to trouble breathing, feeling of throat closing, fever, spreading rash, or any other concerns.

## 2018-03-09 NOTE — ED Triage Notes (Signed)
Pt reports rash to bilateral thighs for the past couple or itching, denies pain only itching

## 2018-03-09 NOTE — ED Provider Notes (Signed)
[name] EMERGENCY DEPARTMENT Provider Note   CSN: [id number] Arrival date & time: 03/09/18  0027     History   Chief Complaint Chief Complaint  Patient presents with  . Rash    HPI [name] is a 32 y.o. male with a history of tobacco abuse who presents to the emergency department with complaints of rash that started yesterday.  Patient states rash is located on the inner mid thigh and posterior thigh bilaterally.  Rash is pruritic.  No specific alleviating or aggravating factors.  No interventions prior to arrival.  He states that he did have a new laundry detergent, otherwise no new products/lotions/environment/foods.  Denies fever, chills, feeling of throat closing, sore throat, shortness of breath, or rash to palms/soles/genitals.  No recent known tick exposures.  No recent new sexual partners or concerns for STDs.  HPI  History reviewed. No pertinent past medical history.  There are no active problems to display for this patient.   History reviewed. No pertinent surgical history.      Home Medications    Prior to Admission medications   Not on File    Family History Family History  Problem Relation Age of Onset  . Diabetes Mother   . Hypertension Mother   . Cancer Mother   . Cancer Father   . Diabetes Father   . Hypertension Father     Social History Social History   Tobacco Use  . Smoking status: Current Every Day Smoker    Packs/day: 0.50    Years: 10.00    Pack years: 5.00    Types: Cigarettes  . Smokeless tobacco: Never Used  Substance Use Topics  . Alcohol use: No    Frequency: Never  . Drug use: Yes    Types: Marijuana, Methamphetamines    Comment: Herion     Allergies   Patient has no known allergies.   Review of Systems Review of Systems  HENT: Negative for sore throat and trouble swallowing.   Respiratory: Negative for shortness of breath.   Skin: Positive for rash.     Physical Exam Updated Vital  Signs BP 128/86   Pulse 94   Temp 98.1 F (36.7 C)   Resp 16   SpO2 100%   Physical Exam  Constitutional: He appears well-developed and well-nourished. No distress.  HENT:  Head: Normocephalic and atraumatic.  Patient is tolerating his own secretions without difficulty.  No trismus.  No drooling.  Airway is patent without edema.  No stridor.  No appreciable angioedema.  Eyes: Conjunctivae are normal. Right eye exhibits no discharge. Left eye exhibits no discharge.  Cardiovascular: Normal rate and regular rhythm.  Pulmonary/Chest: Effort normal and breath sounds normal. No stridor. No respiratory distress. He has no wheezes. He has no rales.  Neurological: He is alert.  Clear speech.   Skin:  Small patches of papules to the medial and posterior mid thigh.  Very few non-blanchable areas suspicious for petechiae, however majority are papular in nature.  No significant surrounding erythema or warmth.  No pustules.  No purulent drainage.  No palpable induration or fluctuance.  There is no palm/soles/webspace involvement.  No genital involvement.   Psychiatric: He has a normal mood and affect. His behavior is normal. Thought content normal.  Nursing note and vitals reviewed.   ED Treatments / Results  Labs (all labs ordered are listed, but only abnormal results are displayed) Labs Reviewed - No data to display  EKG None  Radiology  No results found.  Procedures Procedures (including critical care time)  Medications Ordered in ED Medications - No data to display   Initial Impression / Assessment and Plan / ED Course  I have reviewed the triage vital signs and the nursing notes.  Pertinent labs & imaging results that were available during my care of the patient were reviewed by me and considered in my medical decision making (see chart for details).   Patient presents to the emergency department with rash to bilateral thighs that is pruritic.  Patient nontoxic-appearing, resting  comfortably, vitals WNL.  Rash mostly papular, it is pruritic, suspicious for an irritant dermatitis, unclear definitive etiology.  No difficulties breathing or swallowing, airways patent without stridor, handling secretions without difficulty, no angioedema.   There is no inter-webspace or genital area involvement to raise concern for scabies. There is no palm/sole involvement.  No evidence of superimposed infection.  Not concerned for SJS, TEN, TSS, tickborne illness, syphilis, or other life-threatening condition at this time.  Will trial topical hydrocortisone with PCP follow-up. I discussed, treatment plan, need for PCP follow-up, and return precautions with the patient. Provided opportunity for questions, patient confirmed understanding and is in agreement with plan.   Findings and plan of care discussed with supervising physician Dr. Eudelia Bunchardama- in agreement with plan.   Final Clinical Impressions(s) / ED Diagnoses   Final diagnoses:  Rash    ED Discharge Orders         Ordered    hydrocortisone cream 1 %     03/09/18 0106           Sharona Rovner, Pleas KochSamantha R, PA-C 03/09/18 [id number]    Nira Connardama, Pedro Eduardo, MD 03/09/18 [phone]

## 2018-03-09 NOTE — ED Notes (Addendum)
See EDP assessment. Pt verbalizes understanding of d/c instructions. Prescriptions reviewed with patient. Pt ambulatory at d/c with all belongings.  

## 2018-03-10 ENCOUNTER — Emergency Department (HOSPITAL_COMMUNITY)
Admission: EM | Admit: 2018-03-10 | Discharge: 2018-03-10 | Disposition: A | Discharge status: Home / Self Care | Payer: Self-pay | Attending: Emergency Medicine | Admitting: Emergency Medicine

## 2018-03-10 ENCOUNTER — Encounter (HOSPITAL_COMMUNITY): Payer: Self-pay

## 2018-03-10 ENCOUNTER — Other Ambulatory Visit: Payer: Self-pay

## 2018-03-10 DIAGNOSIS — R21 Rash and other nonspecific skin eruption: Secondary | ICD-10-CM

## 2018-03-10 DIAGNOSIS — T7840XA Allergy, unspecified, initial encounter: Secondary | ICD-10-CM

## 2018-03-10 MED ORDER — HYDROCORTISONE 1 % EX OINT
TOPICAL_OINTMENT | Freq: Once | CUTANEOUS | Status: AC
  Administered 2018-03-10: 04:00:00 via TOPICAL
  Filled 2018-03-10: qty 28

## 2018-03-10 MED ORDER — HYDROCORTISONE 1 % EX CREA: TOPICAL_CREAM | CUTANEOUS | 0 refills | Status: DC

## 2018-03-10 MED ORDER — DEXAMETHASONE SODIUM PHOSPHATE 10 MG/ML IJ SOLN
10.0000 mg | Freq: Once | INTRAMUSCULAR | Status: AC
  Administered 2018-03-10: 10 mg via INTRAMUSCULAR
  Filled 2018-03-10: qty 1

## 2018-03-10 MED ORDER — DIPHENHYDRAMINE HCL 25 MG PO CAPS
25.0000 mg | ORAL_CAPSULE | Freq: Once | ORAL | Status: AC
  Administered 2018-03-10: 25 mg via ORAL
  Filled 2018-03-10: qty 1

## 2018-03-10 NOTE — ED Notes (Signed)
Discharge instructions reviewed with pt. Pt verbalized understanding. Pt to follow up with PCP. Pt ambulatory to waiting room.  

## 2018-03-10 NOTE — ED Triage Notes (Signed)
Pt reports rash on bilateral legs that appeared 2 days ago and it "itches really bad."  Started as "two little bumps on the back of right leg and has spread to both legs"

## 2018-03-10 NOTE — ED Provider Notes (Signed)
[facility]-EMERGENCY DEPT Provider Note   CSN: [id number] Arrival date & time: 03/09/18  2258     History   Chief Complaint Chief Complaint  Patient presents with  . Rash    HPI [name] is a 32 y.o. male.  HPI  32 year old male comes in with chief complaint of rash. Patient reports 2-day history of rash that started in his groin and has spread to the rest of his foot.  Patient describes the rash as itchy.  There is no pain.  Patient denies any history of URI-like symptoms, flulike symptoms, fevers, chills, tick exposures, outdoor activities, skin allergies or skin disease.  Patient was seen last night with the same symptoms.  He did not fill his prescription.  History reviewed. No pertinent past medical history.  There are no active problems to display for this patient.   History reviewed. No pertinent surgical history.      Home Medications    Prior to Admission medications   Not on File    Family History Family History  Problem Relation Age of Onset  . Diabetes Mother   . Hypertension Mother   . Cancer Mother   . Cancer Father   . Diabetes Father   . Hypertension Father     Social History Social History   Tobacco Use  . Smoking status: Current Every Day Smoker    Packs/day: 1.00    Years: 10.00    Pack years: 10.00    Types: Cigarettes  . Smokeless tobacco: Never Used  Substance Use Topics  . Alcohol use: No    Frequency: Never  . Drug use: Not Currently    Types: Marijuana, Methamphetamines    Comment: Herion     Allergies   Patient has no known allergies.   Review of Systems Review of Systems  Constitutional: Negative for chills, fatigue and fever.  Skin: Positive for rash.  Allergic/Immunologic: Negative for immunocompromised state.  Psychiatric/Behavioral: Negative for confusion.     Physical Exam Updated Vital Signs BP 131/81 (BP Location: Left Arm)   Pulse 79   Temp 98.4 F (36.9 C) (Oral)   Resp  16   Ht 6\' 1"  (1.854 m)   Wt 77.1 kg   SpO2 99%   BMI 22.43 kg/m   Physical Exam  Constitutional: He is oriented to person, place, and time. He appears well-developed.  HENT:  Head: Atraumatic.  Neck: Neck supple.  Cardiovascular: Normal rate.  Pulmonary/Chest: Effort normal.  Neurological: He is alert and oriented to person, place, and time.  Skin: Skin is warm. Rash noted.  Fine erythematous rash in both extremities.  Positive blanching.  Petechiae noted.  Nursing note and vitals reviewed.          ED Treatments / Results  Labs (all labs ordered are listed, but only abnormal results are displayed) Labs Reviewed - No data to display  EKG None  Radiology No results found.  Procedures Procedures (including critical care time)  Medications Ordered in ED Medications  hydrocortisone 1 % ointment ( Topical Given 03/10/18 0334)  dexamethasone (DECADRON) injection 10 mg (10 mg Intramuscular Given 03/10/18 0334)  diphenhydrAMINE (BENADRYL) capsule 25 mg (25 mg Oral Given 03/10/18 0334)     Initial Impression / Assessment and Plan / ED Course  I have reviewed the triage vital signs and the nursing notes.  Pertinent labs & imaging results that were available during my care of the patient were reviewed by me and considered in my  medical decision making (see chart for details).     32 year old male comes in with chief complaint of rash.  Patient reports that he started developing rash a couple of days ago, and the rash has progressed.  The rash appears to be fine erythematous petechiae type lesion, there is blanching.  The lesion is itchy and not painful. ,  No viral syndrome like prodrome.  I am unsure as to what the underlying process is, but it seems like hypersensitivity mediated process.  Patient was given topical hydrocortisone ointment, and he felt a lot better and reported that his symptoms of itching resolved.  We have advised her to return to the ER if his  symptoms get worse he starts having any headaches, altered mental status.  Final Clinical Impressions(s) / ED Diagnoses   Final diagnoses:  Rash and nonspecific skin eruption  Hypersensitivity, initial encounter    ED Discharge Orders         Ordered    hydrocortisone cream 1 %  Status:  Discontinued     03/10/18 0442           Derwood KaplanNanavati, Loreal Schuessler, MD 03/10/18 [phone]

## 2018-03-10 NOTE — Discharge Instructions (Addendum)
Apply the ointment that we have provided to you twice a day. Return to the ER if your symptoms get worse or you start developing severe headaches or confusion.

## 2018-03-27 ENCOUNTER — Encounter (HOSPITAL_COMMUNITY): Payer: Self-pay | Admitting: Emergency Medicine

## 2018-03-27 ENCOUNTER — Emergency Department (HOSPITAL_COMMUNITY)
Admission: EM | Admit: 2018-03-27 | Discharge: 2018-03-28 | Disposition: A | Discharge status: Home / Self Care | Payer: Self-pay | Attending: Emergency Medicine | Admitting: Emergency Medicine

## 2018-03-27 ENCOUNTER — Other Ambulatory Visit: Payer: Self-pay

## 2018-03-27 DIAGNOSIS — B9789 Other viral agents as the cause of diseases classified elsewhere: Secondary | ICD-10-CM | POA: Insufficient documentation

## 2018-03-27 DIAGNOSIS — J069 Acute upper respiratory infection, unspecified: Secondary | ICD-10-CM | POA: Insufficient documentation

## 2018-03-27 DIAGNOSIS — F17210 Nicotine dependence, cigarettes, uncomplicated: Secondary | ICD-10-CM | POA: Insufficient documentation

## 2018-03-27 NOTE — ED Notes (Signed)
Patient was call x5 by ct tech and ed staff call pts name  No answer .

## 2018-03-27 NOTE — ED Triage Notes (Signed)
Pt reports productive cough with green mucous, sore throat and runny nose for the last 3 weeks. Pt denies pain. Pt has not tried any otc medications.

## 2018-03-27 NOTE — ED Notes (Signed)
Pt name called x3 in waiting room. Will try again in a few minutes

## 2018-03-28 ENCOUNTER — Emergency Department (HOSPITAL_COMMUNITY): Payer: Self-pay

## 2018-03-28 NOTE — ED Provider Notes (Signed)
[name] EMERGENCY DEPARTMENT Provider Note   CSN: [id number] Arrival date & time: 03/27/18  2154     History   Chief Complaint Chief Complaint  Patient presents with  . Cough  . URI    HPI [name] is a 32 y.o. male.  The history is provided by the patient and medical records.  Cough   URI   Associated symptoms include congestion and cough.     32 y.o. M here with cough, nasal congestion, sore throat, cough, and body aches for the past 3 weeks.  States cough is productive with green phlegm.  He denies fever/chills.  No chest pain or SOB.  No nausea/vomiting.  States he is hungry and thirsty.  No meds taken PTA.  History reviewed. No pertinent past medical history.  There are no active problems to display for this patient.   History reviewed. No pertinent surgical history.      Home Medications    Prior to Admission medications   Not on File    Family History Family History  Problem Relation Age of Onset  . Diabetes Mother   . Hypertension Mother   . Cancer Mother   . Cancer Father   . Diabetes Father   . Hypertension Father     Social History Social History   Tobacco Use  . Smoking status: Current Every Day Smoker    Packs/day: 1.00    Years: 10.00    Pack years: 10.00    Types: Cigarettes  . Smokeless tobacco: Never Used  Substance Use Topics  . Alcohol use: No    Frequency: Never  . Drug use: Not Currently    Types: Marijuana, Methamphetamines    Comment: Herion     Allergies   Patient has no known allergies.   Review of Systems Review of Systems  HENT: Positive for congestion.   Respiratory: Positive for cough.   All other systems reviewed and are negative.    Physical Exam Updated Vital Signs BP 107/77 (BP Location: Left Arm)   Pulse 80   Temp 97.6 F (36.4 C) (Oral)   Resp 14   Ht 6\' 1"  (1.854 m)   Wt 77.1 kg   SpO2 100%   BMI 22.43 kg/m   Physical Exam  Constitutional: He is oriented to  person, place, and time. He appears well-developed and well-nourished.  HENT:  Head: Normocephalic and atraumatic.  Right Ear: Tympanic membrane and ear canal normal.  Left Ear: Tympanic membrane and ear canal normal.  Nose: Nose normal.  Mouth/Throat: Uvula is midline, oropharynx is clear and moist and mucous membranes are normal.  Tonsils overall normal in appearance bilaterally without exudate; uvula midline without evidence of peritonsillar abscess; handling secretions appropriately; no difficulty swallowing or speaking; normal phonation without stridor PND noted  Eyes: Pupils are equal, round, and reactive to light. Conjunctivae and EOM are normal.  Neck: Normal range of motion.  Cardiovascular: Normal rate, regular rhythm and normal heart sounds.  Pulmonary/Chest: Effort normal and breath sounds normal. No stridor. No respiratory distress. He has no wheezes. He has no rhonchi.  Abdominal: Soft. Bowel sounds are normal. There is no tenderness. There is no rebound.  Musculoskeletal: Normal range of motion.  Neurological: He is alert and oriented to person, place, and time.  Skin: Skin is warm and dry.  Psychiatric: He has a normal mood and affect.  Nursing note and vitals reviewed.    ED Treatments / Results  Labs (all labs  ordered are listed, but only abnormal results are displayed) Labs Reviewed - No data to display  EKG None  Radiology Dg Chest 2 View  Result Date: 03/28/2018 CLINICAL DATA:  Productive cough with green mucus, sore throat and runny nose for 3 weeks EXAM: CHEST - 2 VIEW COMPARISON:  None FINDINGS: Normal heart size, mediastinal contours, and pulmonary vascularity. Lungs minimally hyperinflated but clear. No acute infiltrate, pleural effusion or pneumothorax. Biconvex thoracic scoliosis. IMPRESSION: No acute abnormalities. Electronically Signed   By: Ulyses Southward M.D.   On: 03/28/2018 02:57    Procedures Procedures (including critical care time)  Medications  Ordered in ED Medications - No data to display   Initial Impression / Assessment and Plan / ED Course  I have reviewed the triage vital signs and the nursing notes.  Pertinent labs & imaging results that were available during my care of the patient were reviewed by me and considered in my medical decision making (see chart for details).  32 year old male presenting to the ED with cough and URI type symptoms for the past 3 weeks.  Reports productive cough with green mucus as well as some nasal congestion and postnasal drip.  He is afebrile and nontoxic.  Exam is overall benign aside from some postnasal drip and nasal congestion.  Lungs are clear without wheezes or rhonchi.  Chest x-ray obtained from triage and is negative.  Suspect this is likely viral process.  Discussed using over-the-counter Sudafed or Mucinex to help with this.  Patient was given food and drink here at his request, tolerating p.o. without issue.  Feel he stable for discharge home.  Does not currently have PCP or insurance, referred to patient care center for follow-up.  Can return here for any new or worsening symptoms.  Final Clinical Impressions(s) / ED Diagnoses   Final diagnoses:  Viral URI with cough    ED Discharge Orders    None       Garlon Hatchet, PA-C 03/28/18 1749    Geoffery Lyons, MD 03/28/18 2257

## 2018-03-28 NOTE — ED Notes (Signed)
ED Provider at bedside. 

## 2018-03-28 NOTE — Discharge Instructions (Addendum)
You can try mucinex or sudafed to help with cold/congestive symptoms. Rest and drink lots of fluids. Can try to be seen at patient care center for ongoing evaluation.

## 2018-03-30 ENCOUNTER — Ambulatory Visit (HOSPITAL_COMMUNITY)
Admission: RE | Admit: 2018-03-30 | Discharge: 2018-03-30 | Disposition: A | Discharge status: Home / Self Care | Payer: Self-pay | Attending: Psychiatry | Admitting: Psychiatry

## 2018-03-30 DIAGNOSIS — F330 Major depressive disorder, recurrent, mild: Secondary | ICD-10-CM | POA: Insufficient documentation

## 2018-03-30 DIAGNOSIS — F17210 Nicotine dependence, cigarettes, uncomplicated: Secondary | ICD-10-CM | POA: Insufficient documentation

## 2018-03-30 DIAGNOSIS — F1520 Other stimulant dependence, uncomplicated: Secondary | ICD-10-CM | POA: Insufficient documentation

## 2018-03-30 NOTE — H&P (Signed)
Behavioral Health Medical Screening Exam  [name] is an 32 y.o. male.  Total Time spent with patient: 20 minutes  Psychiatric Specialty Exam: Physical Exam  Nursing note and vitals reviewed. Constitutional: He is oriented to person, place, and time. He appears well-developed and well-nourished.  Cardiovascular: Normal rate.  Respiratory: Effort normal.  Musculoskeletal: Normal range of motion.  Neurological: He is alert and oriented to person, place, and time.  Skin: Skin is warm.    Review of Systems  Constitutional: Negative.   HENT: Negative.   Eyes: Negative.   Respiratory: Negative.   Cardiovascular: Negative.   Gastrointestinal: Negative.   Genitourinary: Negative.   Musculoskeletal: Negative.   Skin: Negative.   Neurological: Negative.   Endo/Heme/Allergies: Negative.   Psychiatric/Behavioral: Positive for substance abuse. Negative for depression, hallucinations and suicidal ideas. The patient is not nervous/anxious.     Blood pressure 138/83, pulse 86, temperature 98.5 F (36.9 C), resp. rate 18, SpO2 100 %.There is no height or weight on file to calculate BMI.  General Appearance: Casual  Eye Contact:  Good  Speech:  Clear and Coherent and Normal Rate  Volume:  Normal  Mood:  Euthymic  Affect:  Flat  Thought Process:  Goal Directed and Descriptions of Associations: Intact  Orientation:  Full (Time, Place, and Person)  Thought Content:  WDL  Suicidal Thoughts:  No  Homicidal Thoughts:  No  Memory:  Immediate;   Good Recent;   Good Remote;   Good  Judgement:  Fair  Insight:  Fair  Psychomotor Activity:  Normal  Concentration: Concentration: Good and Attention Span: Good  Recall:  Good  Fund of Knowledge:Good  Language: Good  Akathisia:  No  Handed:  Right  AIMS (if indicated):     Assets:  Communication Skills Desire for Improvement Physical Health  Sleep:       Musculoskeletal: Strength & Muscle Tone: within normal limits Gait & Station:  normal Patient leans: N/A  Blood pressure 138/83, pulse 86, temperature 98.5 F (36.9 C), resp. rate 18, SpO2 100 %.  Recommendations:  Based on my evaluation the patient does not appear to have an emergency medical condition.  Gerlene Burdockravis B Money, FNP 03/30/2018, 3:16 PM

## 2018-03-30 NOTE — BH Assessment (Signed)
Tele Assessment Note   Patient Name: [name] MRN: [id number] Referring Physician: Nelly Rout, MD Location of Patient: [facility] Location of Provider: Behavioral Health TTS Department  [name] is a single 32 y.o. male who presents voluntarily to [facility] for a walk-in assessment. Pt is reporting symptoms of depression and requesting substance abuse tx. Pt has a history of alcohol abuse (last use 4 years ago) and currently, methamphetamine (at least a gram q d x 6 years). Pt  reports last meth use 3 days ago. Pt reports he has been homeless x 10 years. He has little contact with his family. Pt said he last spoke with his mother 6 months ago & she said if he was in rehab the family would be involved in his life. Pt denies SI & HI. He reports 1 suicide attempt at 32 years old.  Pt denies AVH & other sx of psychosis.  MSE: Pt is casually dressed, alert, oriented x4 with normal speech and normal motor behavior. Eye contact is good. Pt's mood is pleasant and affect is constricted. Affect is congruent with mood. Thought process is coherent and relevant. There is no indication pt is currently responding to internal stimuli or experiencing delusional thought content. Pt was cooperative throughout assessment.   Diagnosis: F15.20 Amphetamine-type substance use disorder, Severe; F33.0 Major depressive disorder, Recurrent episode, Mild Disposition: Reola Calkins, NP recommends pt f/u with a substance abuse tx program. Pt did not meet criteria for inpt tx. Pt was given addresses and phone numbers to local tx programs.  Past Medical History: No past medical history on file.  No past surgical history on file.  Family History:  Family History  Problem Relation Age of Onset  . Diabetes Mother   . Hypertension Mother   . Cancer Mother   . Cancer Father   . Diabetes Father   . Hypertension Father     Social History:  reports that he has been smoking cigarettes. He has a 10.00 pack-year  smoking history. He has never used smokeless tobacco. He reports that he has current or past drug history. Drugs: Marijuana and Methamphetamines. He reports that he does not drink alcohol.  Additional Social History:  Alcohol / Drug Use Pain Medications: see MAR Prescriptions: see MAR Over the Counter: see MAR History of alcohol / drug use?: Yes Longest period of sobriety (when/how long): n/a Negative Consequences of Use: Financial, Personal relationships, Work / School Substance #2 Name of Substance 2: Methamphetamine 2 - Age of First Use: 25 2 - Amount (size/oz): gram or more  2 - Frequency: q d 2 - Last Use / Amount: 03/26/18  CIWA: CIWA-Ar BP: 138/83 Pulse Rate: 86 COWS:    Allergies: No Known Allergies  Home Medications:  (Not in a hospital admission)  OB/GYN Status:  No LMP for male patient.  General Assessment Data Location of Assessment: [facility] Assessment Services TTS Assessment: In system Is this a Tele or Face-to-Face Assessment?: Face-to-Face Is this an Initial Assessment or a Re-assessment for this encounter?: Initial Assessment Living Arrangements: Homeless/Shelter What gender do you identify as?: Male Marital status: Single Can pt return to current living arrangement?: Yes Admission Status: Voluntary Is patient capable of signing voluntary admission?: Yes Referral Source: Self/Family/Friend Insurance type: none  Medical Screening Exam [facility] Walk-in ONLY) Medical Exam completed: Yes  Crisis Care Plan Name of Psychiatrist: none Name of Therapist: none  Education Status Is patient currently in school?: No Is the patient employed, unemployed or receiving disability?: Unemployed  Risk to self with the past 6 months Suicidal Ideation: No Has patient been a risk to self within the past 6 months prior to admission? : No Suicidal Intent: No Has patient had any suicidal intent within the past 6 months prior to admission? : No Is patient at risk for suicide?:  No Suicidal Plan?: No Has patient had any suicidal plan within the past 6 months prior to admission? : No What has been your use of drugs/alcohol within the last 12 months?: daily Previous Attempts/Gestures: Yes How many times?: 1 Other Self Harm Risks: living on the streets Triggers for Past Attempts: Unknown Intentional Self Injurious Behavior: None Family Suicide History: Unknown Persecutory voices/beliefs?: No Depression: Yes Depression Symptoms: Despondent, Insomnia, Isolating, Fatigue, Guilt, Loss of interest in usual pleasures, Feeling worthless/self pity Substance abuse history and/or treatment for substance abuse?: Yes Suicide prevention information given to non-admitted patients: Yes  Risk to Others within the past 6 months Homicidal Ideation: No Does patient have any lifetime risk of violence toward others beyond the six months prior to admission? : No Thoughts of Harm to Others: No Current Homicidal Intent: No Current Homicidal Plan: No Access to Homicidal Means: No History of harm to others?: No Assessment of Violence: None Noted Does patient have access to weapons?: No Criminal Charges Pending?: No Does patient have a court date: No Is patient on probation?: No  Psychosis Hallucinations: None noted Delusions: None noted  Mental Status Report Appearance/Hygiene: Unremarkable Eye Contact: Good Motor Activity: Unremarkable Speech: Logical/coherent, Unremarkable Level of Consciousness: Alert Mood: Pleasant Affect: Constricted Anxiety Level: Minimal Thought Processes: Relevant Judgement: Unimpaired Orientation: Person, Place, Time, Situation Obsessive Compulsive Thoughts/Behaviors: None  Cognitive Functioning Concentration: Normal Memory: Recent Intact, Remote Intact Is patient IDD: No Impulse Control: Fair Appetite: Fair Have you had any weight changes? : Gain Sleep: No Change Vegetative Symptoms: None  ADLScreening [facility] Assessment  Services) Patient's cognitive ability adequate to safely complete daily activities?: Yes Patient able to express need for assistance with ADLs?: Yes Independently performs ADLs?: Yes (appropriate for developmental age)  Prior Inpatient Therapy Prior Inpatient Therapy: Yes Prior Therapy Dates: pt unsure Prior Therapy Facilty/Provider(s): pt unsure Reason for Treatment: Suicide attempt at 32 yo  Prior Outpatient Therapy Prior Outpatient Therapy: No Does patient have an ACCT team?: No Does patient have Intensive In-House Services?  : No Does patient have [facility] services? : No Does patient have P4CC services?: No  ADL Screening (condition at time of admission) Patient's cognitive ability adequate to safely complete daily activities?: Yes Is the patient deaf or have difficulty hearing?: No Does the patient have difficulty seeing, even when wearing glasses/contacts?: No Does the patient have difficulty concentrating, remembering, or making decisions?: No Patient able to express need for assistance with ADLs?: Yes Does the patient have difficulty dressing or bathing?: No Independently performs ADLs?: Yes (appropriate for developmental age) Does the patient have difficulty walking or climbing stairs?: No Weakness of Legs: None Weakness of Arms/Hands: None  Home Assistive Devices/Equipment Home Assistive Devices/Equipment: None  Therapy Consults (therapy consults require a physician order) PT Evaluation Needed: No OT Evalulation Needed: No SLP Evaluation Needed: No Abuse/Neglect Assessment (Assessment to be complete while patient is alone) Abuse/Neglect Assessment Can Be Completed: Yes Physical Abuse: Denies Verbal Abuse: Denies Sexual Abuse: Denies Exploitation of patient/patient's resources: Denies Self-Neglect: Denies Values / Beliefs Cultural Requests During Hospitalization: None Spiritual Requests During Hospitalization: None Consults Spiritual Care Consult Needed:  No Social 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 Pt did not meet criteria for inpt tx. Pt was given addresses and phone numbers to local tx programs.  Disposition Initial Assessment Completed for this Encounter: Yes Disposition of Patient: Discharge Patient refused recommended treatment: No Mode of transportation if patient is discharged?: Bus Patient referred to: Other (Comment)(substance abuse tx programs)  This service was provided via telemedicine using a 2-way, interactive audio and Immunologistvideo technology.  Names of all persons participating in this telemedicine service and their role in this encounter. Name: Edman Circleeirdre Maleigha Colvard, lcsw Role: Reola Calkinsravis Money, NP  Name: Juanda ChanceErik Vezina Role:   Marcelus Dubberly Suzan NailerH Wesson Stith 03/30/2018 3:54 PM

## 2018-04-30 ENCOUNTER — Emergency Department (HOSPITAL_COMMUNITY)
Admission: EM | Admit: 2018-04-30 | Discharge: 2018-05-01 | Disposition: A | Discharge status: Home / Self Care | Payer: Self-pay | Attending: Emergency Medicine | Admitting: Emergency Medicine

## 2018-04-30 DIAGNOSIS — K0889 Other specified disorders of teeth and supporting structures: Secondary | ICD-10-CM | POA: Insufficient documentation

## 2018-04-30 DIAGNOSIS — Z5321 Procedure and treatment not carried out due to patient leaving prior to being seen by health care provider: Secondary | ICD-10-CM | POA: Insufficient documentation

## 2018-05-01 ENCOUNTER — Encounter (HOSPITAL_COMMUNITY): Payer: Self-pay | Admitting: *Deleted

## 2018-05-01 ENCOUNTER — Emergency Department (HOSPITAL_COMMUNITY)
Admission: EM | Admit: 2018-05-01 | Discharge: 2018-05-01 | Disposition: A | Discharge status: Home / Self Care | Payer: Self-pay | Attending: Emergency Medicine | Admitting: Emergency Medicine

## 2018-05-01 ENCOUNTER — Other Ambulatory Visit: Payer: Self-pay

## 2018-05-01 ENCOUNTER — Encounter (HOSPITAL_COMMUNITY): Payer: Self-pay | Admitting: Emergency Medicine

## 2018-05-01 DIAGNOSIS — F17210 Nicotine dependence, cigarettes, uncomplicated: Secondary | ICD-10-CM | POA: Insufficient documentation

## 2018-05-01 DIAGNOSIS — Z590 Homelessness: Secondary | ICD-10-CM | POA: Insufficient documentation

## 2018-05-01 DIAGNOSIS — K029 Dental caries, unspecified: Secondary | ICD-10-CM | POA: Insufficient documentation

## 2018-05-01 MED ORDER — PENICILLIN V POTASSIUM 500 MG PO TABS
500.0000 mg | ORAL_TABLET | Freq: Once | ORAL | Status: AC
  Administered 2018-05-01: 500 mg via ORAL
  Filled 2018-05-01: qty 1

## 2018-05-01 MED ORDER — PENICILLIN V POTASSIUM 500 MG PO TABS: 500.0000 mg | ORAL_TABLET | Freq: Four times a day (QID) | ORAL | 0 refills | Status: DC

## 2018-05-01 MED ORDER — IBUPROFEN 800 MG PO TABS
800.0000 mg | ORAL_TABLET | Freq: Once | ORAL | Status: AC
  Administered 2018-05-01: 800 mg via ORAL
  Filled 2018-05-01: qty 1

## 2018-05-01 MED ORDER — IBUPROFEN 800 MG PO TABS: 800.0000 mg | ORAL_TABLET | Freq: Three times a day (TID) | ORAL | 0 refills | Status: DC | PRN

## 2018-05-01 MED ORDER — PENICILLIN V POTASSIUM 250 MG PO TABS
500.0000 mg | ORAL_TABLET | Freq: Once | ORAL | Status: AC
  Administered 2018-05-01: 500 mg via ORAL
  Filled 2018-05-01: qty 2

## 2018-05-01 NOTE — ED Triage Notes (Signed)
Pt arrives to ED for same compliant as last night was not able to get seen before leaving- pt has gum pain and mouth ulcers states this is from his crystal meth use.

## 2018-05-01 NOTE — ED Notes (Signed)
Patient verbalizes understanding of medications and discharge instructions. No further questions at this time. VSS and patient ambulatory at discharge.   

## 2018-05-01 NOTE — ED Triage Notes (Signed)
Pt c/o gum pain x 3 days.

## 2018-05-01 NOTE — ED Notes (Signed)
Pt observed throwing out his stickers and BP cuff and walking outside, pt remains sitting outside at this time

## 2018-05-01 NOTE — Discharge Instructions (Signed)
You may alternate Tylenol 1000 mg every 6 hours as needed for pain and Ibuprofen 800 mg every 8 hours as needed for pain.  Please take Ibuprofen with food.  You may use over-the-counter Orajel to help with dental pain.

## 2018-05-01 NOTE — ED Provider Notes (Addendum)
[facility]-EMERGENCY DEPT Provider Note   CSN: [id number] Arrival date & time: 05/01/18  2054     History   Chief Complaint Chief Complaint  Patient presents with  . gum pain  . Homeless    HPI Hammad Finkler is a 32 y.o. male.  Cobi Delph is a 32 y.o. Male Modena Jansky to the emergency department for evaluation of upper dental pain, was seen for the same at 5:45 AM this morning.  Patient reports he has a history of methamphetamine use which is caused a decay to multiple of his teeth.  He states he does not have a dentist that he regularly sees.  Denies fevers, difficulty swallowing, speaking or breathing.  No facial swelling redness or warmth.  He reports he was unable to fill the penicillin that he was prescribed this morning because he is homeless and cannot afford it.  He has not been doing anything for the pain, but is requesting something to eat and reports he has been able to chew with minimal difficulty.     History reviewed. No pertinent past medical history.  There are no active problems to display for this patient.   History reviewed. No pertinent surgical history.      Home Medications    Prior to Admission medications   Medication Sig Start Date End Date Taking? Authorizing Provider  ibuprofen (ADVIL,MOTRIN) 800 MG tablet Take 1 tablet (800 mg total) by mouth every 8 (eight) hours as needed for mild pain. 05/01/18   Ward, Layla Maw, DO  penicillin v potassium (VEETID) 500 MG tablet Take 1 tablet (500 mg total) by mouth 4 (four) times daily. 05/01/18   Ward, Layla Maw, DO    Family History Family History  Problem Relation Age of Onset  . Diabetes Mother   . Hypertension Mother   . Cancer Mother   . Cancer Father   . Diabetes Father   . Hypertension Father     Social History Social History   Tobacco Use  . Smoking status: Current Every Day Smoker    Packs/day: 1.00    Years: 10.00    Pack years: 10.00    Types: Cigarettes  . Smokeless  tobacco: Never Used  Substance Use Topics  . Alcohol use: No    Frequency: Never  . Drug use: Yes    Types: Marijuana, Methamphetamines    Comment: Herion     Allergies   Patient has no known allergies.   Review of Systems Review of Systems  Constitutional: Negative for chills and fever.  HENT: Positive for dental problem. Negative for drooling, facial swelling, trouble swallowing and voice change.   Eyes: Negative for pain.  Respiratory: Negative for stridor.   Gastrointestinal: Negative for nausea and vomiting.  Musculoskeletal: Negative for neck pain and neck stiffness.  Skin: Negative for color change.     Physical Exam Updated Vital Signs BP 122/73   Pulse (!) 102   Temp 98 F (36.7 C)   Resp 18   Ht 6\' 1"  (1.854 m)   Wt 77.1 kg   SpO2 99%   BMI 22.43 kg/m   Physical Exam  Constitutional: He appears well-developed and well-nourished. No distress.  HENT:  Head: Normocephalic and atraumatic.  normal nose; moist mucous membranes; No pharyngeal erythema or petechiae, no tonsillar hypertrophy or exudate, no uvular deviation, no unilateral swelling, no trismus or drooling, no muffled voice, normal phonation, no stridor, multiple dental caries present with tenderness over the left upper posterior  teeth with significant decay to the first and second molar, no drainable dental abscess noted, no Ludwig's angina, tongue sits flat in the bottom of the mouth, no angioedema, no facial erythema or warmth, no facial swelling; no pain with movement of the neck.  Eyes: Right eye exhibits no discharge. Left eye exhibits no discharge.  Neck: Neck supple.  Pulmonary/Chest: Effort normal. No stridor. No respiratory distress.  Neurological: He is alert. Coordination normal.  Skin: Skin is warm and dry. He is not diaphoretic.  Psychiatric: He has a normal mood and affect. His behavior is normal.  Nursing note and vitals reviewed.    ED Treatments / Results  Labs (all labs ordered  are listed, but only abnormal results are displayed) Labs Reviewed - No data to display  EKG None  Radiology No results found.  Procedures Procedures (including critical care time)  Medications Ordered in ED Medications  penicillin v potassium (VEETID) tablet 500 mg (has no administration in time range)     Initial Impression / Assessment and Plan / ED Course  I have reviewed the triage vital signs and the nursing notes.  Pertinent labs & imaging results that were available during my care of the patient were reviewed by me and considered in my medical decision making (see chart for details).  Patient with toothache.  No gross abscess.  Exam unconcerning for Ludwig's angina or spread of infection.  Patient seen for the same early this morning and was prescribed penicillin patient has been urged to fill this prescription and provided a good Rx discount prescription card, encouraged to treat pain with ibuprofen, Tylenol and Orajel.  Urged patient follow-up with dentist and provided dental resources.  Final Clinical Impressions(s) / ED Diagnoses   Final diagnoses:  Pain due to dental caries    ED Discharge Orders    None       Dartha Lodge, Cordelia Poche 05/01/18 2125    Dartha Lodge, PA-C 05/01/18 2126    Gwyneth Sprout, MD 05/01/18 [phone]

## 2018-05-01 NOTE — Discharge Instructions (Signed)
Please fill take entire course of antibiotics you were prescribed this morning.  Continue using ibuprofen and Tylenol, as well as prescribed Orajel for pain.  You will need to follow-up with your dentist for continued management of this.  Return to the emergency department for fevers, swelling or pain under the tongue or in the neck, difficulty breathing or swallowing or any other new or concerning symptoms.

## 2018-05-01 NOTE — ED Triage Notes (Signed)
pt reports pain in gums x 3 days. Pt has poor dental hygiene with partial teeth missing. Pt reports he uses meth.

## 2018-05-01 NOTE — ED Provider Notes (Signed)
TIME SEEN: 5:44 AM  CHIEF COMPLAINT: Dental pain  HPI: Patient is a 32 year old male with history of methamphetamine use who presents to the emergency department with left upper dental pain for the past several days.  States he has seen a dentist in the past but does not have one currently.  No fever, difficulty swallowing, speaking or breathing.  No facial swelling, redness or warmth.  ROS: See HPI Constitutional: no fever  Eyes: no drainage  ENT: no runny nose   Cardiovascular:  no chest pain  Resp: no SOB  GI: no vomiting GU: no dysuria Integumentary: no rash  Allergy: no hives  Musculoskeletal: no leg swelling  Neurological: no slurred speech ROS otherwise negative  PAST MEDICAL HISTORY/PAST SURGICAL HISTORY:  No past medical history on file.  MEDICATIONS:  Prior to Admission medications   Not on File    ALLERGIES:  No Known Allergies  SOCIAL HISTORY:  Social History   Tobacco Use  . Smoking status: Current Every Day Smoker    Packs/day: 1.00    Years: 10.00    Pack years: 10.00    Types: Cigarettes  . Smokeless tobacco: Never Used  Substance Use Topics  . Alcohol use: No    Frequency: Never    FAMILY HISTORY: Family History  Problem Relation Age of Onset  . Diabetes Mother   . Hypertension Mother   . Cancer Mother   . Cancer Father   . Diabetes Father   . Hypertension Father     EXAM: BP 131/82 (BP Location: Right Arm)   Pulse 99   Temp 98 F (36.7 C) (Oral)   Resp 16   SpO2 99%  CONSTITUTIONAL: Alert and oriented and responds appropriately to questions. Well-appearing; well-nourished HEAD: Normocephalic EYES: Conjunctivae clear, pupils appear equal, EOMI ENT: normal nose; moist mucous membranes; No pharyngeal erythema or petechiae, no tonsillar hypertrophy or exudate, no uvular deviation, no unilateral swelling, no trismus or drooling, no muffled voice, normal phonation, no stridor, multiple dental caries present with tenderness over the left  upper posterior teeth with significant decay to the first and second molar, no drainable dental abscess noted, no Ludwig's angina, tongue sits flat in the bottom of the mouth, no angioedema, no facial erythema or warmth, no facial swelling; no pain with movement of the neck. NECK: Supple, no meningismus, no nuchal rigidity, no LAD  CARD: RRR; S1 and S2 appreciated; no murmurs, no clicks, no rubs, no gallops RESP: Normal chest excursion without splinting or tachypnea; breath sounds clear and equal bilaterally; no wheezes, no rhonchi, no rales, no hypoxia or respiratory distress, speaking full sentences ABD/GI: Normal bowel sounds; non-distended; soft, non-tender, no rebound, no guarding, no peritoneal signs, no hepatosplenomegaly BACK:  The back appears normal and is non-tender to palpation, there is no CVA tenderness EXT: Normal ROM in all joints; non-tender to palpation; no edema; normal capillary refill; no cyanosis, no calf tenderness or swelling    SKIN: Normal color for age and race; warm; no rash NEURO: Moves all extremities equally PSYCH: The patient's mood and manner are appropriate. Grooming and personal hygiene are appropriate.  MEDICAL DECISION MAKING: Patient here with dental caries causing dental pain.  Recommended alternating Tylenol and Motrin.  Will discharge on penicillin and given outpatient dental follow-up.  Recommended over-the-counter Orajel.  No sign of Ludwigs angina, drainable abscess.  Patient is otherwise well-appearing.  At this time, I do not feel there is any life-threatening condition present. I have reviewed and discussed all results (  EKG, imaging, lab, urine as appropriate) and exam findings with patient/family. I have reviewed nursing notes and appropriate previous records.  I feel the patient is safe to be discharged home without further emergent workup and can continue workup as an outpatient as needed. Discussed usual and customary return precautions. Patient/family  verbalize understanding and are comfortable with this plan.  Outpatient follow-up has been provided if needed. All questions have been answered.      Ward, Layla Maw, DO 05/01/18 [phone]

## 2018-06-06 ENCOUNTER — Emergency Department (HOSPITAL_COMMUNITY): Admission: EM | Admit: 2018-06-06 | Discharge: 2018-06-06 | Discharge status: Against Medical Advice | Payer: Self-pay

## 2018-06-06 ENCOUNTER — Emergency Department (HOSPITAL_COMMUNITY)
Admission: EM | Admit: 2018-06-06 | Discharge: 2018-06-06 | Disposition: A | Discharge status: Home / Self Care | Payer: Self-pay | Attending: Emergency Medicine | Admitting: Emergency Medicine

## 2018-06-06 ENCOUNTER — Other Ambulatory Visit: Payer: Self-pay

## 2018-06-06 ENCOUNTER — Encounter (HOSPITAL_COMMUNITY): Payer: Self-pay | Admitting: Emergency Medicine

## 2018-06-06 ENCOUNTER — Encounter (HOSPITAL_COMMUNITY): Payer: Self-pay | Admitting: *Deleted

## 2018-06-06 DIAGNOSIS — M79673 Pain in unspecified foot: Secondary | ICD-10-CM | POA: Insufficient documentation

## 2018-06-06 DIAGNOSIS — M2141 Flat foot [pes planus] (acquired), right foot: Secondary | ICD-10-CM | POA: Insufficient documentation

## 2018-06-06 DIAGNOSIS — F17210 Nicotine dependence, cigarettes, uncomplicated: Secondary | ICD-10-CM | POA: Insufficient documentation

## 2018-06-06 DIAGNOSIS — G8929 Other chronic pain: Secondary | ICD-10-CM

## 2018-06-06 DIAGNOSIS — Z79899 Other long term (current) drug therapy: Secondary | ICD-10-CM | POA: Insufficient documentation

## 2018-06-06 DIAGNOSIS — M2142 Flat foot [pes planus] (acquired), left foot: Secondary | ICD-10-CM | POA: Insufficient documentation

## 2018-06-06 DIAGNOSIS — Z113 Encounter for screening for infections with a predominantly sexual mode of transmission: Secondary | ICD-10-CM | POA: Insufficient documentation

## 2018-06-06 MED ORDER — IBUPROFEN 600 MG PO TABS: 600.0000 mg | ORAL_TABLET | Freq: Three times a day (TID) | ORAL | 0 refills | Status: DC | PRN

## 2018-06-06 MED ORDER — KETOROLAC TROMETHAMINE 60 MG/2ML IM SOLN
60.0000 mg | Freq: Once | INTRAMUSCULAR | Status: AC
  Administered 2018-06-06: 60 mg via INTRAMUSCULAR
  Filled 2018-06-06: qty 2

## 2018-06-06 NOTE — ED Triage Notes (Signed)
Patient reports "I was partying with these shady characters and they got me nervous so I figured I'd come in to get checked out." Requesting STD/HIV check. Denies sx.

## 2018-06-06 NOTE — ED Notes (Signed)
Bed: WTR9 Expected date:  Expected time:  Means of arrival:  Comments: 

## 2018-06-06 NOTE — ED Provider Notes (Signed)
[name] EMERGENCY DEPARTMENT Provider Note   CSN: [id number] Arrival date & time: 06/06/18  2300     History   Chief Complaint Chief Complaint  Patient presents with  . Foot Pain    HPI [name] is a 32 y.o. male.  HPI   32 yo M with PMHx homelessness, poor social situation, here with bilateral foot pain.  The patient states that he has been walking a significant amount today.  He is currently in town and was seen at [facility] earlier today following STD exposure.  He has been walking around [place] since then, waiting for a ride back to his home.  He states that he has developed progressive worsening aching, throbbing, bilateral foot pain.  He has a history of flatfeet and notes chronic foot pain at least several times weekly.  He does not have a car so he walks several miles per day.  He wears shoes with very little support.  The shoes are also too small for him.  Denies any new trauma.  No fevers or chills.  No rash or wounds.  No other complaints.  Pain is worse with weightbearing and palpation.  No alleviating factors.  History reviewed. No pertinent past medical history.  There are no active problems to display for this patient.   History reviewed. No pertinent surgical history.      Home Medications    Prior to Admission medications   Medication Sig Start Date End Date Taking? Authorizing Provider  ibuprofen (ADVIL,MOTRIN) 600 MG tablet Take 1 tablet (600 mg total) by mouth every 8 (eight) hours as needed for moderate pain. 06/06/18   Shaune Pollack, MD  penicillin v potassium (VEETID) 500 MG tablet Take 1 tablet (500 mg total) by mouth 4 (four) times daily. 05/01/18   Ward, Layla Maw, DO    Family History Family History  Problem Relation Age of Onset  . Diabetes Mother   . Hypertension Mother   . Cancer Mother   . Cancer Father   . Diabetes Father   . Hypertension Father     Social History Social History   Tobacco Use  .  Smoking status: Current Every Day Smoker    Packs/day: 1.00    Years: 10.00    Pack years: 10.00    Types: Cigarettes  . Smokeless tobacco: Never Used  Substance Use Topics  . Alcohol use: No    Frequency: Never  . Drug use: Yes    Types: Marijuana, Methamphetamines    Comment: Herion     Allergies   Patient has no known allergies.   Review of Systems Review of Systems  Constitutional: Negative for chills, fatigue and fever.  HENT: Negative for congestion and rhinorrhea.   Eyes: Negative for visual disturbance.  Respiratory: Negative for cough, shortness of breath and wheezing.   Cardiovascular: Negative for chest pain and leg swelling.  Gastrointestinal: Negative for abdominal pain, diarrhea, nausea and vomiting.  Genitourinary: Negative for dysuria and flank pain.  Musculoskeletal: Positive for arthralgias and gait problem. Negative for neck pain and neck stiffness.  Skin: Negative for rash and wound.  Allergic/Immunologic: Negative for immunocompromised state.  Neurological: Negative for syncope, weakness and headaches.  All other systems reviewed and are negative.    Physical Exam Updated Vital Signs BP (!) 134/92 (BP Location: Right Arm)   Pulse 89   Temp 97.6 F (36.4 C) (Oral)   Resp 16   SpO2 98%   Physical Exam  Constitutional:  He is oriented to person, place, and time. He appears well-developed and well-nourished. No distress.  HENT:  Head: Normocephalic and atraumatic.  Eyes: Conjunctivae are normal.  Neck: Neck supple.  Cardiovascular: Normal rate, regular rhythm and normal heart sounds.  Pulmonary/Chest: Effort normal. No respiratory distress. He has no wheezes.  Abdominal: He exhibits no distension.  Musculoskeletal: He exhibits no edema.  Neurological: He is alert and oriented to person, place, and time. He exhibits normal muscle tone.  Skin: Skin is warm. Capillary refill takes less than 2 seconds. No rash noted.  Nursing note and vitals  reviewed.  LOWER EXTREMITY EXAM: BILATERAL  INSPECTION & PALPATION: Bilateral pes planus with advanced-for-age arthritic changes. No open wounds. No onychomycosis. No rash. No warmth or significant TTP.  SENSORY: sensation is intact to light touch in:  Superficial peroneal nerve distribution (over dorsum of foot) Deep peroneal nerve distribution (over first dorsal web space) Sural nerve distribution (over lateral aspect 5th metatarsal) Saphenous nerve distribution (over medial instep)  MOTOR:  + Motor EHL (great toe dorsiflexion) + FHL (great toe plantar flexion)  + TA (ankle dorsiflexion)  + GSC (ankle plantar flexion)  VASCULAR: 2+ dorsalis pedis and posterior tibialis pulses Capillary refill < 2 sec, toes warm and well-perfused  COMPARTMENTS: Soft, warm, well-perfused No pain with passive extension No parethesias    ED Treatments / Results  Labs (all labs ordered are listed, but only abnormal results are displayed) Labs Reviewed - No data to display  EKG None  Radiology No results found.  Procedures Procedures (including critical care time)  Medications Ordered in ED Medications  ketorolac (TORADOL) injection 60 mg (has no administration in time range)     Initial Impression / Assessment and Plan / ED Course  I have reviewed the triage vital signs and the nursing notes.  Pertinent labs & imaging results that were available during my care of the patient were reviewed by me and considered in my medical decision making (see chart for details).     32 yo m here with b/l foot pain 2/2 pes planus, long-distance walking. His situation is also complicated by poor health care/resource access and he has flat shoes with little support. Advised him to attempt to obtain more supportive shoes, as well as try to follow-up w/ podiatry for possible inserts. Otherwise, recommended NSAIDs PRN, elevating feet at night. No apparent infectious etiology. No trauma. Distal NV is  intact.  Final Clinical Impressions(s) / ED Diagnoses   Final diagnoses:  Pes planus of both feet  Chronic foot pain, unspecified laterality    ED Discharge Orders         Ordered    ibuprofen (ADVIL,MOTRIN) 600 MG tablet  Every 8 hours PRN     06/06/18 2320           Shaune PollackIsaacs, Marykathryn Carboni, MD 06/06/18 2320

## 2018-06-06 NOTE — Discharge Instructions (Addendum)
You were evaluated today for STDs.  Did not want prophylactic treatment for gonorrhea chlamydia while in the department.  You will be called in the next 48 hours if your results are positive.  He will need to follow-up with a medical provider for treatment of these results are positive.  These results are positive you need to notify your partners that they can be treated as well.  I recommend using condoms as protection to prevent STDs.  Return to the ED for new or worsening symptoms.

## 2018-06-06 NOTE — ED Provider Notes (Signed)
[facility]-EMERGENCY DEPT Provider Note   CSN: [id number] Arrival date & time: 06/06/18  1244     History   Chief Complaint Chief Complaint  Patient presents with  . STD check    HPI [name] is a 32 y.o. male with no significant medical history who presents for evaluation of STD check.  Patient states that he was "partying with shady characters and they got me all nervous so I figured I did come and get an STD check."  Patient denies history of STDs.  Patient states he does have sexual intercourse with male and male partners.  Patient states they were "Shady bitches and they probably gave me something like the clap."  Denies fever, chills, nausea, vomiting, abdominal pain, penile discharge, genital sores or lesions, dysuria, testicular pain, painful bowel movements.  Patient denies using condoms with sexual intercourse.  Patient states "I do not want a look at it can you get my screen from blood work and in the urine."   History obtained from patient.  No interpreter was used.  HPI  History reviewed. No pertinent past medical history.  There are no active problems to display for this patient.   History reviewed. No pertinent surgical history.      Home Medications    Prior to Admission medications   Medication Sig Start Date End Date Taking? Authorizing Provider  ibuprofen (ADVIL,MOTRIN) 800 MG tablet Take 1 tablet (800 mg total) by mouth every 8 (eight) hours as needed for mild pain. 05/01/18   Ward, Layla Maw, DO  penicillin v potassium (VEETID) 500 MG tablet Take 1 tablet (500 mg total) by mouth 4 (four) times daily. 05/01/18   Ward, Layla Maw, DO    Family History Family History  Problem Relation Age of Onset  . Diabetes Mother   . Hypertension Mother   . Cancer Mother   . Cancer Father   . Diabetes Father   . Hypertension Father     Social History Social History   Tobacco Use  . Smoking status: Current Every Day Smoker   Packs/day: 1.00    Years: 10.00    Pack years: 10.00    Types: Cigarettes  . Smokeless tobacco: Never Used  Substance Use Topics  . Alcohol use: No    Frequency: Never  . Drug use: Yes    Types: Marijuana, Methamphetamines    Comment: Herion     Allergies   Patient has no known allergies.   Review of Systems Review of Systems  Constitutional: Negative.   HENT: Negative.   Respiratory: Negative.   Gastrointestinal: Negative.   Genitourinary: Negative.   Musculoskeletal: Negative.   Skin: Negative.   Neurological: Negative for dizziness, weakness and light-headedness.  All other systems reviewed and are negative.    Physical Exam Updated Vital Signs BP 120/68 (BP Location: Left Arm)   Pulse 100   Temp 99 F (37.2 C) (Oral)   Resp 20   SpO2 98%   Physical Exam  Constitutional: He appears well-developed and well-nourished. No distress.  HENT:  Head: Atraumatic.  Mouth/Throat: Oropharynx is clear and moist.  Eyes: Pupils are equal, round, and reactive to light.  Neck: Normal range of motion. Neck supple.  Cardiovascular: Normal rate, regular rhythm, normal heart sounds and intact distal pulses. Exam reveals no friction rub.  No murmur heard. Pulmonary/Chest: Effort normal and breath sounds normal. No stridor. No respiratory distress. He has no wheezes. He has no rales. He exhibits no tenderness.  Abdominal: Soft. Bowel sounds are normal. He exhibits no distension and no mass. There is no tenderness. There is no rebound and no guarding. No hernia.  Genitourinary:  Genitourinary Comments: Patient refused GU exam.  Patient states "I do not want you to look at it can you get my screen from my blood in urine."  Musculoskeletal: Normal range of motion.  Neurological: He is alert.  Skin: Skin is warm and dry. He is not diaphoretic.  Psychiatric: He has a normal mood and affect.  Nursing note and vitals reviewed.    ED Treatments / Results  Labs (all labs ordered  are listed, but only abnormal results are displayed) Labs Reviewed  HIV ANTIBODY (ROUTINE TESTING W REFLEX)  RPR  GC/CHLAMYDIA PROBE AMP ([place]) NOT AT [facility]    EKG None  Radiology No results found.  Procedures Procedures (including critical care time)  Medications Ordered in ED Medications - No data to display   Initial Impression / Assessment and Plan / ED Course  I have reviewed the triage vital signs and the nursing notes.  Pertinent labs & imaging results that were available during my care of the patient were reviewed by me and considered in my medical decision making (see chart for details).  32 year old male who appears otherwise well presents for evaluation of STD check.  Afebrile, nonseptic, non-ill-appearing.  Denies history of STDs.  Patient refused GU exam.  States "I do not want you to look at it can you check for it with my blood in urine."  Discussed with patient that a GU exam is used to determine other pathology such as orchitis, epididymitis.  Discussed with patient risk versus benefit of obtaining GU exam.  Patient voiced understanding of risk versus benefit however continues to deny GU exam.  Will obtain cultures for gonorrhea and chlamydia from urine.  We will also obtain HIV and syphilis screen.  Patient does not want prophylactic treatment for gonorrhea/ chlamydia in the department.  Patient states he prefers to wait for test results.  Patient understands that his test results are pending that they will take approximately 2 days to return.  Patient understands that if these are positive he will notify his partners and that he will also need to return for treatment at that time.  Patient afebrile without abdominal tenderness, abdominal pain or painful bowel movements to indicate prostatitis.  Unable to perform GU exam secondary to patient's refusal.  Patient to be discharged with instructions to follow-up with PCP.  Discussed importance of using protection when  sexually active.  Patient is hemodynamically stable and appropriate for DC home at this time.  Discussed return precautions.  Patient voiced understanding and is agreeable for follow-up.    Final Clinical Impressions(s) / ED Diagnoses   Final diagnoses:  Screening examination for STD (sexually transmitted disease)    ED Discharge Orders    None       Adrian Specht A, PA-C 06/06/18 1350    Sabas SousBero, Michael M, MD 06/06/18 1558

## 2018-06-06 NOTE — ED Triage Notes (Signed)
Pt states bilateral foot pain

## 2018-06-07 LAB — HIV ANTIBODY (ROUTINE TESTING W REFLEX): HIV SCREEN 4TH GENERATION: NONREACTIVE

## 2018-06-07 LAB — RPR: RPR: NONREACTIVE

## 2018-06-07 LAB — GC/CHLAMYDIA PROBE AMP (~~LOC~~) NOT AT ARMC
CHLAMYDIA, DNA PROBE: NEGATIVE
Neisseria Gonorrhea: NEGATIVE

## 2018-06-12 ENCOUNTER — Other Ambulatory Visit: Payer: Self-pay

## 2018-06-12 ENCOUNTER — Encounter (HOSPITAL_COMMUNITY): Payer: Self-pay | Admitting: Emergency Medicine

## 2018-06-12 ENCOUNTER — Emergency Department (HOSPITAL_COMMUNITY)
Admission: EM | Admit: 2018-06-12 | Discharge: 2018-06-12 | Disposition: A | Discharge status: Home / Self Care | Payer: Self-pay | Attending: Emergency Medicine | Admitting: Emergency Medicine

## 2018-06-12 DIAGNOSIS — Y929 Unspecified place or not applicable: Secondary | ICD-10-CM | POA: Insufficient documentation

## 2018-06-12 DIAGNOSIS — S90829A Blister (nonthermal), unspecified foot, initial encounter: Secondary | ICD-10-CM | POA: Insufficient documentation

## 2018-06-12 DIAGNOSIS — F17210 Nicotine dependence, cigarettes, uncomplicated: Secondary | ICD-10-CM | POA: Insufficient documentation

## 2018-06-12 DIAGNOSIS — Y33XXXA Other specified events, undetermined intent, initial encounter: Secondary | ICD-10-CM | POA: Insufficient documentation

## 2018-06-12 DIAGNOSIS — Y9301 Activity, walking, marching and hiking: Secondary | ICD-10-CM | POA: Insufficient documentation

## 2018-06-12 DIAGNOSIS — Y999 Unspecified external cause status: Secondary | ICD-10-CM | POA: Insufficient documentation

## 2018-06-12 DIAGNOSIS — B49 Unspecified mycosis: Secondary | ICD-10-CM | POA: Insufficient documentation

## 2018-06-12 DIAGNOSIS — T148XXA Other injury of unspecified body region, initial encounter: Secondary | ICD-10-CM

## 2018-06-12 MED ORDER — TERBINAFINE HCL 1 % EX CREA
TOPICAL_CREAM | Freq: Every day | CUTANEOUS | Status: DC
  Administered 2018-06-12: 1 via TOPICAL
  Filled 2018-06-12: qty 12

## 2018-06-12 NOTE — ED Triage Notes (Signed)
Pt reports having bilateral foot pain that is chronic.

## 2018-06-12 NOTE — ED Provider Notes (Signed)
[facility]-EMERGENCY DEPT Provider Note   CSN: [id number] Arrival date & time: 06/12/18  0144     History   Chief Complaint Chief Complaint  Patient presents with  . Foot Pain    HPI [name] is a 32 y.o. male.  The history is provided by the patient and medical records.  Foot Pain      32 y.o. M here with bilateral foot pain.  He reports he has been walking long distances on a daily basis as he has no other way to get around.  He denies any focal trauma or injury to the feet.  States he has been told he has "flat feet" before.  He reports only having a single pair of socks and shoes, has not been able to wash socks or bathe much recently.  No fever that he has noticed.  No meds tried PTA.  History reviewed. No pertinent past medical history.  There are no active problems to display for this patient.   History reviewed. No pertinent surgical history.      Home Medications    Prior to Admission medications   Medication Sig Start Date End Date Taking? Authorizing Provider  ibuprofen (ADVIL,MOTRIN) 600 MG tablet Take 1 tablet (600 mg total) by mouth every 8 (eight) hours as needed for moderate pain. 06/06/18   Shaune PollackIsaacs, Cameron, MD  penicillin v potassium (VEETID) 500 MG tablet Take 1 tablet (500 mg total) by mouth 4 (four) times daily. 05/01/18   Ward, Layla MawKristen N, DO    Family History Family History  Problem Relation Age of Onset  . Diabetes Mother   . Hypertension Mother   . Cancer Mother   . Cancer Father   . Diabetes Father   . Hypertension Father     Social History Social History   Tobacco Use  . Smoking status: Current Every Day Smoker    Packs/day: 1.00    Years: 10.00    Pack years: 10.00    Types: Cigarettes  . Smokeless tobacco: Never Used  Substance Use Topics  . Alcohol use: No    Frequency: Never  . Drug use: Yes    Types: Marijuana, Methamphetamines    Comment: Herion     Allergies   Patient has no known  allergies.   Review of Systems Review of Systems  Musculoskeletal: Positive for arthralgias.  All other systems reviewed and are negative.    Physical Exam Updated Vital Signs BP 119/87 (BP Location: Left Arm)   Pulse 92   Temp 98.3 F (36.8 C) (Oral)   Resp 17   Ht 6' (1.829 m)   Wt 77.1 kg   SpO2 100%   BMI 23.06 kg/m   Physical Exam  Constitutional: He is oriented to person, place, and time. He appears well-developed and well-nourished.  Sleeping, had to be awoken for exam, started eating Reese cups during exam  HENT:  Head: Normocephalic and atraumatic.  Mouth/Throat: Oropharynx is clear and moist.  Eyes: Pupils are equal, round, and reactive to light. Conjunctivae and EOM are normal.  Neck: Normal range of motion.  Cardiovascular: Normal rate, regular rhythm and normal heart sounds.  Pulmonary/Chest: Effort normal and breath sounds normal.  Abdominal: Soft. Bowel sounds are normal.  Musculoskeletal: Normal range of motion.  Socks and shoes removed-- socks are wet along the soles from presumed sweat; malodorous Small blisters have formed along the arches of the feet; areas of fungus and macerated skin between the toes; no bleeding;  no abscess formation; some areas of callus along the lateral aspects of great toe and 5th digit; both feet are NVI  Neurological: He is alert and oriented to person, place, and time.  Skin: Skin is warm and dry.  Psychiatric: He has a normal mood and affect.  Nursing note and vitals reviewed.    ED Treatments / Results  Labs (all labs ordered are listed, but only abnormal results are displayed) Labs Reviewed - No data to display  EKG None  Radiology No results found.  Procedures Procedures (including critical care time)  Medications Ordered in ED Medications  terbinafine (LAMISIL) 1 % cream (1 application Topical Given 06/12/18 0535)     Initial Impression / Assessment and Plan / ED Course  I have reviewed the triage  vital signs and the nursing notes.  Pertinent labs & imaging results that were available during my care of the patient were reviewed by me and considered in my medical decision making (see chart for details).  32 year old male here with bilateral foot pain.  Patient has been walking for hours every day because he has no other means of transportation.  Soft tissues were removed, socks are wet from what appears to be sweat, they are malodorous.  Does have some blisters along the arch of his foot as well as fungus noted in between the toes.  Suspect blisters are from friction and walking in wet socks.  He does not have any signs of cellulitis, no hx of DM.  No areas of necrosis, feet are NVI.  He was given clean and dry socks here, given Lamisil cream to continue using.  He was given several other pairs of socks so that he can change them daily and try to keep them dry.  He was given podiatry follow-up.  He will return here for any new or worsening symptoms.  Final Clinical Impressions(s) / ED Diagnoses   Final diagnoses:  Fungus infection  Blister    ED Discharge Orders    None       Garlon Hatchet, PA-C 06/12/18 0545    Shon Baton, MD 06/12/18 [phone]

## 2018-06-12 NOTE — Discharge Instructions (Signed)
Try to keep your socks dry and change them frequently. Continue using cream on the bottom of your feet and between the toes. Try to follow-up with podiatry. Return here for any new/acute changes.

## 2018-06-23 ENCOUNTER — Other Ambulatory Visit: Payer: Self-pay

## 2018-06-23 ENCOUNTER — Emergency Department (HOSPITAL_COMMUNITY): Payer: Self-pay

## 2018-06-23 ENCOUNTER — Emergency Department (HOSPITAL_COMMUNITY)
Admission: EM | Admit: 2018-06-23 | Discharge: 2018-06-24 | Disposition: A | Discharge status: Home / Self Care | Payer: Self-pay | Attending: Emergency Medicine | Admitting: Emergency Medicine

## 2018-06-23 ENCOUNTER — Encounter (HOSPITAL_COMMUNITY): Payer: Self-pay | Admitting: Emergency Medicine

## 2018-06-23 DIAGNOSIS — R0602 Shortness of breath: Secondary | ICD-10-CM | POA: Insufficient documentation

## 2018-06-23 DIAGNOSIS — F17210 Nicotine dependence, cigarettes, uncomplicated: Secondary | ICD-10-CM | POA: Insufficient documentation

## 2018-06-23 DIAGNOSIS — R10816 Epigastric abdominal tenderness: Secondary | ICD-10-CM | POA: Insufficient documentation

## 2018-06-23 DIAGNOSIS — R197 Diarrhea, unspecified: Secondary | ICD-10-CM | POA: Insufficient documentation

## 2018-06-23 DIAGNOSIS — R748 Abnormal levels of other serum enzymes: Secondary | ICD-10-CM | POA: Insufficient documentation

## 2018-06-23 DIAGNOSIS — R0789 Other chest pain: Secondary | ICD-10-CM | POA: Insufficient documentation

## 2018-06-23 LAB — CBC
HEMATOCRIT: 44.1 % (ref 39.0–52.0)
Hemoglobin: 14 g/dL (ref 13.0–17.0)
MCH: 28.9 pg (ref 26.0–34.0)
MCHC: 31.7 g/dL (ref 30.0–36.0)
MCV: 91.1 fL (ref 80.0–100.0)
Platelets: 158 10*3/uL (ref 150–400)
RBC: 4.84 MIL/uL (ref 4.22–5.81)
RDW: 13.4 % (ref 11.5–15.5)
WBC: 3.8 10*3/uL — AB (ref 4.0–10.5)
nRBC: 0 % (ref 0.0–0.2)

## 2018-06-23 LAB — COMPREHENSIVE METABOLIC PANEL
ALK PHOS: 62 U/L (ref 38–126)
ALT: 75 U/L — AB (ref 0–44)
AST: 77 U/L — AB (ref 15–41)
Albumin: 3.8 g/dL (ref 3.5–5.0)
Anion gap: 11 (ref 5–15)
BILIRUBIN TOTAL: 0.6 mg/dL (ref 0.3–1.2)
BUN: 11 mg/dL (ref 6–20)
CHLORIDE: 94 mmol/L — AB (ref 98–111)
CO2: 25 mmol/L (ref 22–32)
CREATININE: 0.69 mg/dL (ref 0.61–1.24)
Calcium: 8.2 mg/dL — ABNORMAL LOW (ref 8.9–10.3)
GFR calc Af Amer: >60 mL/min (ref >60)
Glucose, Bld: 106 mg/dL — ABNORMAL HIGH (ref 70–99)
Potassium: 3.2 mmol/L — ABNORMAL LOW (ref 3.5–5.1)
Sodium: 130 mmol/L — ABNORMAL LOW (ref 135–145)
Total Protein: 6.7 g/dL (ref 6.5–8.1)

## 2018-06-23 LAB — LIPASE, BLOOD: Lipase: 100 U/L — ABNORMAL HIGH (ref 11–51)

## 2018-06-23 MED ORDER — SODIUM CHLORIDE 0.9 % IV BOLUS
1000.0000 mL | Freq: Once | INTRAVENOUS | Status: AC
  Administered 2018-06-23: 1000 mL via INTRAVENOUS

## 2018-06-23 MED ORDER — IPRATROPIUM-ALBUTEROL 0.5-2.5 (3) MG/3ML IN SOLN
3.0000 mL | Freq: Once | RESPIRATORY_TRACT | Status: AC
  Administered 2018-06-23: 3 mL via RESPIRATORY_TRACT
  Filled 2018-06-23: qty 3

## 2018-06-23 NOTE — ED Provider Notes (Signed)
[facility] EMERGENCY DEPARTMENT Provider Note   CSN: [id number] Arrival date & time: 06/23/18  2138     History   Chief Complaint Chief Complaint  Patient presents with  . Shortness of Breath  . Diarrhea    HPI [name] is a 32 y.o. male.  Patient presents to the emergency department with a chief complaint of chest tightness and shortness of breath.  He reports symptoms have been present for the past 2 days.  They have been gradually worsening.  He denies central chest pain, stating that just feels tight.  He is a daily smoker.  He denies any fever, or productive cough.  He also notes that he has had dark green diarrhea for the past 4 days, but this has resolved today.  States that he had chills associated with the diarrhea.  Denies any treatments prior to arrival.  Denies any other associated symptoms.  The history is provided by the patient. No language interpreter was used.    History reviewed. No pertinent past medical history.  There are no active problems to display for this patient.   History reviewed. No pertinent surgical history.      Home Medications    Prior to Admission medications   Medication Sig Start Date End Date Taking? Authorizing Provider  ibuprofen (ADVIL,MOTRIN) 600 MG tablet Take 1 tablet (600 mg total) by mouth every 8 (eight) hours as needed for moderate pain. 06/06/18   Shaune Pollack, MD  penicillin v potassium (VEETID) 500 MG tablet Take 1 tablet (500 mg total) by mouth 4 (four) times daily. 05/01/18   Ward, Layla Maw, DO    Family History Family History  Problem Relation Age of Onset  . Diabetes Mother   . Hypertension Mother   . Cancer Mother   . Cancer Father   . Diabetes Father   . Hypertension Father     Social History Social History   Tobacco Use  . Smoking status: Current Every Day Smoker    Packs/day: 1.00    Years: 10.00    Pack years: 10.00    Types: Cigarettes  . Smokeless tobacco: Never Used    Substance Use Topics  . Alcohol use: No    Frequency: Never  . Drug use: Yes    Types: Marijuana, Methamphetamines    Comment: Herion     Allergies   Patient has no known allergies.   Review of Systems Review of Systems  All other systems reviewed and are negative.    Physical Exam Updated Vital Signs BP 119/82 (BP Location: Right Arm)   Pulse 91   Temp 98.6 F (37 C) (Oral)   Resp 16   Ht 6' (1.829 m)   Wt 77.1 kg   SpO2 100%   BMI 23.06 kg/m   Physical Exam  Constitutional: He is oriented to person, place, and time. He appears well-developed and well-nourished.  Anxious appearing  HENT:  Head: Normocephalic and atraumatic.  Eyes: Pupils are equal, round, and reactive to light. Conjunctivae and EOM are normal. Right eye exhibits no discharge. Left eye exhibits no discharge. No scleral icterus.  Neck: Normal range of motion. Neck supple. No JVD present.  Cardiovascular: Normal rate, regular rhythm and normal heart sounds. Exam reveals no gallop and no friction rub.  No murmur heard. Pulmonary/Chest: Effort normal. No respiratory distress. He has wheezes. He has no rales. He exhibits no tenderness.  Abdominal: Soft. He exhibits no distension and no mass. There is tenderness.  There is no rebound and no guarding.  Mild epigastric tenderness  Musculoskeletal: Normal range of motion. He exhibits no edema or tenderness.  Neurological: He is alert and oriented to person, place, and time.  Skin: Skin is warm and dry.  Psychiatric: He has a normal mood and affect. His behavior is normal. Judgment and thought content normal.  Nursing note and vitals reviewed.    ED Treatments / Results  Labs (all labs ordered are listed, but only abnormal results are displayed) Labs Reviewed  LIPASE, BLOOD - Abnormal; Notable for the following components:      Result Value   Lipase 100 (*)    All other components within normal limits  COMPREHENSIVE METABOLIC PANEL - Abnormal;  Notable for the following components:   Sodium 130 (*)    Potassium 3.2 (*)    Chloride 94 (*)    Glucose, Bld 106 (*)    Calcium 8.2 (*)    AST 77 (*)    ALT 75 (*)    All other components within normal limits  CBC - Abnormal; Notable for the following components:   WBC 3.8 (*)    All other components within normal limits    EKG EKG Interpretation  Date/Time:  Saturday June 23 2018 22:47:11 EST Ventricular Rate:  91 PR Interval:  152 QRS Duration: 114 QT Interval:  358 QTC Calculation: 440 R Axis:   90 Text Interpretation:  Unusual P axis, possible ectopic atrial rhythm Rightward axis Incomplete right bundle branch block No significant change since last tracing Confirmed by Gwyneth Sprout ([id number]) on 06/23/2018 10:05:45 PM   Radiology Dg Chest 2 View  Result Date: 06/23/2018 CLINICAL DATA:  Pt c/o shortness of breath x 2 days. Denies chest pain. Also c/o dark green diarrhea and chills x 4 days.sob EXAM: CHEST - 2 VIEW COMPARISON:  03/28/2018 FINDINGS: Sigmoid scoliosis. No effusion, infiltrate, pneumothorax. No acute osseous abnormality. IMPRESSION: No acute cardiopulmonary process. Electronically Signed   By: Genevive Bi M.D.   On: 06/23/2018 23:11   Ct Abdomen Pelvis W Contrast  Result Date: 06/24/2018 CLINICAL DATA:  Initial evaluation for acute generalized abdominal pain. EXAM: CT ABDOMEN AND PELVIS WITH CONTRAST TECHNIQUE: Multidetector CT imaging of the abdomen and pelvis was performed using the standard protocol following bolus administration of intravenous contrast. CONTRAST:  OMNIPAQUE IOHEXOL 300 MG/ML  SOLN COMPARISON:  Prior ultrasound from 05/07/2016. FINDINGS: Lower chest: Mild scattered subsegmental atelectatic changes seen at the lung bases. Superimposed 4 mm pleural base nodule at the left lung base (series 5, image 29), indeterminate. Hepatobiliary: Liver demonstrates a normal contrast enhanced appearance. Gallbladder within normal limits. No  biliary dilatation. Pancreas: Pancreas within normal limits. Spleen: Spleen within normal limits. Adrenals/Urinary Tract: Adrenal glands are normal. Kidneys equal in size with symmetric enhancement. No nephrolithiasis, hydronephrosis, or focal enhancing renal mass. No appreciable hydroureter. Partially distended bladder within normal limits. Stomach/Bowel: Stomach within normal limits. No evidence for bowel obstruction. Appendix within normal limits. No abnormal wall thickening, mucosal enhancement, or inflammatory fat stranding seen elsewhere about the bowels. Vascular/Lymphatic: Normal intravascular enhancement seen throughout the intra-abdominal aorta mesenteric vessels patent proximally. Shotty subcentimeter mesenteric lymph nodes noted within the mid abdomen. No pathologically enlarged lymph nodes identified. Reproductive: Prostate normal. Other: No free air or fluid. Tiny fat containing paraumbilical hernia noted without associated inflammation. Musculoskeletal: No acute osseous abnormality. No discrete lytic or blastic osseous lesions. Thoracolumbar scoliosis noted. IMPRESSION: 1. No CT evidence for acute intra-abdominal or pelvic process. 2.  4 mm pleural base nodule at the left lung base, indeterminate. Please note that Fleischner criteria do not apply in patients of this age. Electronically Signed   By: Rise MuBenjamin  McClintock M.D.   On: 06/24/2018 01:06   [place] Abdomen Limited  Result Date: 06/24/2018 CLINICAL DATA:  32 year old male with right upper quadrant abdominal pain. EXAM: ULTRASOUND ABDOMEN LIMITED RIGHT UPPER QUADRANT COMPARISON:  CT of the abdomen pelvis dated 06/24/2018 FINDINGS: Gallbladder: There is no gallstone, gallbladder wall thickening, or pericholecystic fluid. Probable focal area of adenomyomatosis. Negative sonographic Murphy's sign. Common bile duct: Diameter: 4 mm Liver: The liver is unremarkable. Portal vein is patent on color Doppler imaging with normal direction of blood flow  towards the liver. IMPRESSION: Unremarkable right upper quadrant ultrasound. Electronically Signed   By: Elgie CollardArash  Radparvar M.D.   On: 06/24/2018 02:19    Procedures Procedures (including critical care time)  Medications Ordered in ED Medications  ipratropium-albuterol (DUONEB) 0.5-2.5 (3) MG/3ML nebulizer solution 3 mL (has no administration in time range)     Initial Impression / Assessment and Plan / ED Course  I have reviewed the triage vital signs and the nursing notes.  Pertinent labs & imaging results that were available during my care of the patient were reviewed by me and considered in my medical decision making (see chart for details).    Patient with shortness of breath and chest tightness x2 days.  He does have wheezing on the right side.  Denies any productive cough or fever.  This is new for the patient, will check chest x-ray.  Will give breathing treatment.  Labs ordered in triage are pending.  Patient has elevated LFTs and elevated lipase.  Will give fluids.  No RUQ tenderness.  Will check CT.  CT is negative.  RUQ US negative.  Patient given fluids and feeling much better.  Discussed the importance of follow-up with PCP.  Patient understands and agrees with the plan.   Final Clinical Impressions(s) / ED Diagnoses   Final diagnoses:  SOB (shortness of breath)  Elevated lipase    ED Discharge Orders         Ordered    omeprazole (PRILOSEC) 20 MG capsule  Daily     06/24/18 0227           Roxy HorsemanBrowning, Barrington Worley, PA-C 06/24/18 [id number]    Gwyneth SproutPlunkett, Whitney, MD 06/25/18 0101

## 2018-06-23 NOTE — ED Triage Notes (Signed)
Pt c/o shortness of breath x 2 days. Denies chest pain. Also c/o dark green diarrhea and chills x 4 days.

## 2018-06-23 NOTE — ED Notes (Signed)
Patient transported to X-ray 

## 2018-06-24 ENCOUNTER — Emergency Department (HOSPITAL_COMMUNITY): Payer: Self-pay

## 2018-06-24 MED ORDER — IOHEXOL 300 MG/ML  SOLN
100.0000 mL | Freq: Once | INTRAMUSCULAR | Status: AC | PRN
  Administered 2018-06-24: 100 mL via INTRAVENOUS

## 2018-06-24 MED ORDER — ALBUTEROL SULFATE HFA 108 (90 BASE) MCG/ACT IN AERS
2.0000 | INHALATION_SPRAY | RESPIRATORY_TRACT | Status: DC | PRN
  Administered 2018-06-24: 2 via RESPIRATORY_TRACT
  Filled 2018-06-24: qty 6.7

## 2018-06-24 MED ORDER — OMEPRAZOLE 20 MG PO CPDR: 20.0000 mg | DELAYED_RELEASE_CAPSULE | Freq: Every day | ORAL | 0 refills | Status: DC

## 2018-06-24 MED ORDER — IBUPROFEN 800 MG PO TABS
800.0000 mg | ORAL_TABLET | Freq: Once | ORAL | Status: AC
  Administered 2018-06-24: 800 mg via ORAL
  Filled 2018-06-24: qty 1

## 2018-06-24 NOTE — Discharge Instructions (Addendum)
You need to follow-up with your regular doctor.  You should discuss your liver function and lipase labs with your doctor.  They were slightly elevated.    Return to the ER if your symptoms worsen.

## 2018-06-24 NOTE — ED Notes (Signed)
Patient transported to CT 

## 2018-06-24 NOTE — ED Notes (Signed)
Patient verbalizes understanding of discharge instructions. Opportunity for questioning and answers were provided. Armband removed by staff, pt discharged from ED.  

## 2018-06-26 ENCOUNTER — Other Ambulatory Visit: Payer: Self-pay

## 2018-06-26 ENCOUNTER — Encounter (HOSPITAL_COMMUNITY): Payer: Self-pay | Admitting: Emergency Medicine

## 2018-06-26 ENCOUNTER — Emergency Department (HOSPITAL_COMMUNITY)
Admission: EM | Admit: 2018-06-26 | Discharge: 2018-06-26 | Disposition: A | Discharge status: Home / Self Care | Payer: Self-pay | Attending: Emergency Medicine | Admitting: Emergency Medicine

## 2018-06-26 DIAGNOSIS — Z79899 Other long term (current) drug therapy: Secondary | ICD-10-CM | POA: Insufficient documentation

## 2018-06-26 DIAGNOSIS — F17210 Nicotine dependence, cigarettes, uncomplicated: Secondary | ICD-10-CM | POA: Insufficient documentation

## 2018-06-26 DIAGNOSIS — K0889 Other specified disorders of teeth and supporting structures: Secondary | ICD-10-CM | POA: Insufficient documentation

## 2018-06-26 MED ORDER — AMOXICILLIN 500 MG PO CAPS: 500.0000 mg | ORAL_CAPSULE | Freq: Three times a day (TID) | ORAL | 0 refills | Status: DC

## 2018-06-26 MED ORDER — AMOXICILLIN 500 MG PO CAPS
500.0000 mg | ORAL_CAPSULE | Freq: Once | ORAL | Status: AC
  Administered 2018-06-26: 500 mg via ORAL
  Filled 2018-06-26: qty 1

## 2018-06-26 NOTE — ED Triage Notes (Signed)
Pt reports front lower right sided dental abscess that started hurting around lunch time today. Pain 8/10. Denies any injuries.

## 2018-06-26 NOTE — ED Notes (Signed)
Reviewed d/c instructions with pt, who verbalized understanding and had no outstanding questions. Pt departed in NAD, refused use of wheelchair.   

## 2018-06-26 NOTE — Discharge Instructions (Signed)
Follow-up with a dentist.  Only a dentist can fix your problem.  We recommend that you take amoxicillin as prescribed to treat likely underlying infection.  Take ibuprofen for pain control.  Return to the ED for any new or concerning symptoms. 

## 2018-06-26 NOTE — ED Provider Notes (Signed)
[facility] EMERGENCY DEPARTMENT Provider Note   CSN: [id number] Arrival date & time: 06/26/18  0044    History   Chief Complaint Chief Complaint  Patient presents with  . Dental Pain    HPI [name] is a 32 y.o. male.   The history is provided by the patient. No language interpreter was used.  Dental Pain   This is a new problem. The current episode started yesterday. The problem occurs constantly. The problem has not changed since onset.The pain is mild. Treatments tried: Ibuprofen. The treatment provided mild relief.    History reviewed. No pertinent past medical history.  There are no active problems to display for this patient.   History reviewed. No pertinent surgical history.      Home Medications    Prior to Admission medications   Medication Sig Start Date End Date Taking? Authorizing Provider  amoxicillin (AMOXIL) 500 MG capsule Take 1 capsule (500 mg total) by mouth 3 (three) times daily. 06/26/18   Antony MaduraHumes, Delton Stelle, PA-C  ibuprofen (ADVIL,MOTRIN) 600 MG tablet Take 1 tablet (600 mg total) by mouth every 8 (eight) hours as needed for moderate pain. 06/06/18   Shaune PollackIsaacs, Cameron, MD  omeprazole (PRILOSEC) 20 MG capsule Take 1 capsule (20 mg total) by mouth daily. 06/24/18   Roxy HorsemanBrowning, Robert, PA-C  penicillin v potassium (VEETID) 500 MG tablet Take 1 tablet (500 mg total) by mouth 4 (four) times daily. 05/01/18   Ward, Layla MawKristen N, DO    Family History Family History  Problem Relation Age of Onset  . Diabetes Mother   . Hypertension Mother   . Cancer Mother   . Cancer Father   . Diabetes Father   . Hypertension Father     Social History Social History   Tobacco Use  . Smoking status: Current Every Day Smoker    Packs/day: 1.00    Years: 10.00    Pack years: 10.00    Types: Cigarettes  . Smokeless tobacco: Never Used  Substance Use Topics  . Alcohol use: No    Frequency: Never  . Drug use: Yes    Types: Marijuana, Methamphetamines     Comment: Herion     Allergies   Patient has no known allergies.   Review of Systems Review of Systems Ten systems reviewed and are negative for acute change, except as noted in the HPI.    Physical Exam Updated Vital Signs BP (!) 148/90 (BP Location: Right Arm)   Pulse 89   Temp 98.4 F (36.9 C) (Oral)   Resp 18   SpO2 100%   Physical Exam  Constitutional: He is oriented to person, place, and time. He appears well-developed and well-nourished. No distress.  Nontoxic appearing   HENT:  Head: Normocephalic and atraumatic.  Mouth/Throat: No trismus in the jaw. Abnormal dentition. Dental caries present. No dental abscesses.    No gingival fluctuance or trismus.  Tolerating secretions without difficulty.  Eyes: Conjunctivae and EOM are normal. No scleral icterus.  Neck: Normal range of motion.  No meningismus  Pulmonary/Chest: Effort normal. No respiratory distress.  Respirations even and unlabored  Musculoskeletal: Normal range of motion.  Neurological: He is alert and oriented to person, place, and time. He exhibits normal muscle tone. Coordination normal.  Skin: Skin is warm and dry. No rash noted. He is not diaphoretic. No erythema. No pallor.  Psychiatric: He has a normal mood and affect. His behavior is normal.  Nursing note and vitals reviewed.  ED Treatments / Results  Labs (all labs ordered are listed, but only abnormal results are displayed) Labs Reviewed - No data to display  EKG None  Radiology No results found.  Procedures Procedures (including critical care time)  Medications Ordered in ED Medications  amoxicillin (AMOXIL) capsule 500 mg (has no administration in time range)     Initial Impression / Assessment and Plan / ED Course  I have reviewed the triage vital signs and the nursing notes.  Pertinent labs & imaging results that were available during my care of the patient were reviewed by me and considered in my medical decision  making (see chart for details).     Patient with toothache.  No gross abscess.  Exam unconcerning for Ludwig's angina or spread of infection.  Will treat with Amoxicillin and pain medicine.  Urged patient to follow-up with dentist.  Return precautions discussed and provided. Patient discharged in stable condition with no unaddressed concerns.   Final Clinical Impressions(s) / ED Diagnoses   Final diagnoses:  Dentalgia    ED Discharge Orders         Ordered    amoxicillin (AMOXIL) 500 MG capsule  3 times daily     06/26/18 0351           Antony Madura, PA-C 06/26/18 0355    Zadie Rhine, MD 06/27/18 [phone]

## 2018-07-25 DIAGNOSIS — M79671 Pain in right foot: Secondary | ICD-10-CM | POA: Insufficient documentation

## 2018-07-25 DIAGNOSIS — M2141 Flat foot [pes planus] (acquired), right foot: Secondary | ICD-10-CM | POA: Insufficient documentation

## 2018-07-25 DIAGNOSIS — M2142 Flat foot [pes planus] (acquired), left foot: Secondary | ICD-10-CM | POA: Insufficient documentation

## 2018-07-25 DIAGNOSIS — F17210 Nicotine dependence, cigarettes, uncomplicated: Secondary | ICD-10-CM | POA: Insufficient documentation

## 2018-07-25 DIAGNOSIS — Z79899 Other long term (current) drug therapy: Secondary | ICD-10-CM | POA: Insufficient documentation

## 2018-07-25 DIAGNOSIS — M79672 Pain in left foot: Secondary | ICD-10-CM | POA: Insufficient documentation

## 2018-07-26 ENCOUNTER — Encounter (HOSPITAL_COMMUNITY): Payer: Self-pay | Admitting: Emergency Medicine

## 2018-07-26 ENCOUNTER — Emergency Department (HOSPITAL_COMMUNITY)
Admission: EM | Admit: 2018-07-26 | Discharge: 2018-07-26 | Disposition: A | Discharge status: Home / Self Care | Payer: Self-pay | Attending: Emergency Medicine | Admitting: Emergency Medicine

## 2018-07-26 DIAGNOSIS — M2141 Flat foot [pes planus] (acquired), right foot: Secondary | ICD-10-CM

## 2018-07-26 DIAGNOSIS — M2142 Flat foot [pes planus] (acquired), left foot: Secondary | ICD-10-CM

## 2018-07-26 DIAGNOSIS — M79672 Pain in left foot: Secondary | ICD-10-CM

## 2018-07-26 DIAGNOSIS — M79671 Pain in right foot: Secondary | ICD-10-CM

## 2018-07-26 MED ORDER — MELOXICAM 15 MG PO TABS: 15.0000 mg | ORAL_TABLET | Freq: Every day | ORAL | 0 refills | Status: DC

## 2018-07-26 NOTE — Discharge Instructions (Addendum)
Return for any new or worsening symptoms.

## 2018-07-26 NOTE — ED Notes (Signed)
Pt seen walking out lobby soon after registering. No reply x3.

## 2018-07-26 NOTE — ED Provider Notes (Signed)
[name] EMERGENCY DEPARTMENT Provider Note   CSN: [id number] Arrival date & time: 07/25/18  2346     History   Chief Complaint Chief Complaint  Patient presents with  . Foot Pain    HPI [name] is a very pleasant 33 y.o. male who presents with bilateral foot pain.  Patient is currently homeless.  He has known pes planus and walks several miles daily.  Patient complains of medial arch pain which is worse after walking for several hours.  He also gets pain in his anterior shins.  The patient has a history of polysubstance abuse including methamphetamine addiction.  He denies fevers chills or other abnormalities.  Patient was waiting for a long period of time here in the emergency department and states that with his feet up his pain is now a 1 out of 10.  HPI  History reviewed. No pertinent past medical history.  There are no active problems to display for this patient.   History reviewed. No pertinent surgical history.      Home Medications    Prior to Admission medications   Medication Sig Start Date End Date Taking? Authorizing Provider  amoxicillin (AMOXIL) 500 MG capsule Take 1 capsule (500 mg total) by mouth 3 (three) times daily. 06/26/18   Antony Madura, PA-C  ibuprofen (ADVIL,MOTRIN) 600 MG tablet Take 1 tablet (600 mg total) by mouth every 8 (eight) hours as needed for moderate pain. 06/06/18   Shaune Pollack, MD  meloxicam (MOBIC) 15 MG tablet Take 1 tablet (15 mg total) by mouth daily. 07/26/18   Arthor Captain, PA-C  omeprazole (PRILOSEC) 20 MG capsule Take 1 capsule (20 mg total) by mouth daily. 06/24/18   Roxy Horseman, PA-C  penicillin v potassium (VEETID) 500 MG tablet Take 1 tablet (500 mg total) by mouth 4 (four) times daily. 05/01/18   Ward, Layla Maw, DO    Family History Family History  Problem Relation Age of Onset  . Diabetes Mother   . Hypertension Mother   . Cancer Mother   . Cancer Father   . Diabetes Father   .  Hypertension Father     Social History Social History   Tobacco Use  . Smoking status: Current Every Day Smoker    Packs/day: 1.00    Years: 10.00    Pack years: 10.00    Types: Cigarettes  . Smokeless tobacco: Never Used  Substance Use Topics  . Alcohol use: No    Frequency: Never  . Drug use: Yes    Types: Marijuana, Methamphetamines    Comment: Herion     Allergies   Patient has no known allergies.   Review of Systems Review of Systems Ten systems reviewed and are negative for acute change, except as noted in the HPI.    Physical Exam Updated Vital Signs BP (!) 159/77 (BP Location: Right Arm)   Pulse 100   Temp 98.1 F (36.7 C)   Resp 17   SpO2 99%   Physical Exam Vitals signs and nursing note reviewed.  Constitutional:      General: He is not in acute distress.    Appearance: He is well-developed, well-groomed and underweight. He is not diaphoretic.  HENT:     Head: Normocephalic and atraumatic.  Eyes:     General: No scleral icterus.    Conjunctiva/sclera: Conjunctivae normal.  Neck:     Musculoskeletal: Normal range of motion and neck supple.  Cardiovascular:     Rate and Rhythm:  Normal rate and regular rhythm.     Heart sounds: Normal heart sounds.  Pulmonary:     Effort: Pulmonary effort is normal. No respiratory distress.     Breath sounds: Normal breath sounds.  Abdominal:     Palpations: Abdomen is soft.     Tenderness: There is no abdominal tenderness.  Feet:     Comments: Bilateral pes planus with fallen arches.  Tenderness along the medial arch.  No tenderness along the posterior tibialis.  Soft calf compartments Skin:    General: Skin is warm and dry.  Neurological:     Mental Status: He is alert.  Psychiatric:        Behavior: Behavior normal.      ED Treatments / Results  Labs (all labs ordered are listed, but only abnormal results are displayed) Labs Reviewed - No data to display  EKG None  Radiology No results  found.  Procedures Procedures (including critical care time)  Medications Ordered in ED Medications - No data to display   Initial Impression / Assessment and Plan / ED Course  I have reviewed the triage vital signs and the nursing notes.  Pertinent labs & imaging results that were available during my care of the patient were reviewed by me and considered in my medical decision making (see chart for details).     Very pleasant gentleman with a history of homelessness, pes planus.  We had a discussion about his social situation and I offered the patient outpatient resources for drug rehabilitation.  He will be given an prescription for Mobic which I advised he may be able to receive through family services  Or with Phillips Odor , nurse practitioner at the [facility]. Patient has no obvious injuries.  Not need any imaging at this time.  Patient appears appropriate for discharge.  Return precautions discussed  Final Clinical Impressions(s) / ED Diagnoses   Final diagnoses:  Pes planus of both feet  Foot pain, bilateral    ED Discharge Orders         Ordered    meloxicam (MOBIC) 15 MG tablet  Daily     07/26/18 0434           Arthor Captain, PA-C 07/26/18 0458    Dione Booze, MD 07/26/18 [phone]

## 2018-08-13 ENCOUNTER — Emergency Department (HOSPITAL_COMMUNITY): Admission: EM | Admit: 2018-08-13 | Discharge: 2018-08-13 | Discharge status: Against Medical Advice | Payer: Self-pay

## 2018-08-13 NOTE — ED Notes (Signed)
No answer x2 triage

## 2018-08-13 NOTE — ED Notes (Signed)
No answer for triage x1 

## 2018-08-14 ENCOUNTER — Emergency Department (HOSPITAL_COMMUNITY)
Admission: EM | Admit: 2018-08-14 | Discharge: 2018-08-14 | Disposition: A | Discharge status: Home / Self Care | Payer: Self-pay | Attending: Emergency Medicine | Admitting: Emergency Medicine

## 2018-08-14 ENCOUNTER — Encounter (HOSPITAL_COMMUNITY): Payer: Self-pay

## 2018-08-14 ENCOUNTER — Other Ambulatory Visit: Payer: Self-pay

## 2018-08-14 DIAGNOSIS — M542 Cervicalgia: Secondary | ICD-10-CM | POA: Insufficient documentation

## 2018-08-14 DIAGNOSIS — F17210 Nicotine dependence, cigarettes, uncomplicated: Secondary | ICD-10-CM | POA: Insufficient documentation

## 2018-08-14 DIAGNOSIS — M7918 Myalgia, other site: Secondary | ICD-10-CM

## 2018-08-14 DIAGNOSIS — Z79899 Other long term (current) drug therapy: Secondary | ICD-10-CM | POA: Insufficient documentation

## 2018-08-14 MED ORDER — ACETAMINOPHEN 325 MG PO TABS: 650.0000 mg | ORAL_TABLET | Freq: Four times a day (QID) | ORAL | 0 refills | Status: DC | PRN

## 2018-08-14 NOTE — Discharge Instructions (Addendum)
Please read attached information. If you experience any new or worsening signs or symptoms please return to the emergency room for evaluation. Please follow-up with your primary care provider or specialist as discussed. Please use medication prescribed only as directed and discontinue taking if you have any concerning signs or symptoms.   °

## 2018-08-14 NOTE — ED Notes (Signed)
Patient verbalizes understanding of discharge instructions. Opportunity for questioning and answers were provided. 

## 2018-08-14 NOTE — ED Provider Notes (Signed)
[name] EMERGENCY DEPARTMENT Provider Note   CSN: [id number] Arrival date & time: 08/14/18  0051     History   Chief Complaint Chief Complaint  Patient presents with  . Neck Pain    HPI [name] is a 33 y.o. male.  HPI   32 year old male presents today with complaints of neck pain.  Patient notes pain to the left trapezius.  He notes this started 2 days ago, atraumatic.  Denies any neurological deficits, headache, dizziness.  No medications prior to arrival.  History reviewed. No pertinent past medical history.  There are no active problems to display for this patient.   History reviewed. No pertinent surgical history.      Home Medications    Prior to Admission medications   Medication Sig Start Date End Date Taking? Authorizing Provider  acetaminophen (TYLENOL) 325 MG tablet Take 2 tablets (650 mg total) by mouth every 6 (six) hours as needed. 08/14/18   Austina Constantin, Tinnie Gens, PA-C  amoxicillin (AMOXIL) 500 MG capsule Take 1 capsule (500 mg total) by mouth 3 (three) times daily. 06/26/18   Antony Madura, PA-C  ibuprofen (ADVIL,MOTRIN) 600 MG tablet Take 1 tablet (600 mg total) by mouth every 8 (eight) hours as needed for moderate pain. 06/06/18   Shaune Pollack, MD  meloxicam (MOBIC) 15 MG tablet Take 1 tablet (15 mg total) by mouth daily. 07/26/18   Arthor Captain, PA-C  omeprazole (PRILOSEC) 20 MG capsule Take 1 capsule (20 mg total) by mouth daily. 06/24/18   Roxy Horseman, PA-C  penicillin v potassium (VEETID) 500 MG tablet Take 1 tablet (500 mg total) by mouth 4 (four) times daily. 05/01/18   Ward, Layla Maw, DO    Family History Family History  Problem Relation Age of Onset  . Diabetes Mother   . Hypertension Mother   . Cancer Mother   . Cancer Father   . Diabetes Father   . Hypertension Father     Social History Social History   Tobacco Use  . Smoking status: Current Every Day Smoker    Packs/day: 1.00    Years: 10.00    Pack  years: 10.00    Types: Cigarettes  . Smokeless tobacco: Never Used  Substance Use Topics  . Alcohol use: No    Frequency: Never  . Drug use: Yes    Types: Marijuana, Methamphetamines    Comment: Herion     Allergies   Patient has no known allergies.   Review of Systems Review of Systems  All other systems reviewed and are negative.    Physical Exam Updated Vital Signs BP 116/66   Pulse 70   Temp 98.2 F (36.8 C) (Oral)   Resp 16   SpO2 100%   Physical Exam Vitals signs and nursing note reviewed.  Constitutional:      Appearance: He is well-developed.  HENT:     Head: Normocephalic and atraumatic.  Eyes:     General: No scleral icterus.       Right eye: No discharge.        Left eye: No discharge.     Conjunctiva/sclera: Conjunctivae normal.     Pupils: Pupils are equal, round, and reactive to light.  Neck:     Musculoskeletal: Normal range of motion.     Vascular: No JVD.     Trachea: No tracheal deviation.  Pulmonary:     Effort: Pulmonary effort is normal.     Breath sounds: No stridor.  Musculoskeletal:  Comments: No C or T-spine tenderness palpation, prominent C7 vertebral spine, no surrounding redness, tenderness palpation of left lateral cervical musculature and trapezius-bilateral upper and lower extremity sensation strength motor function intact  Neurological:     Mental Status: He is alert and oriented to person, place, and time.     Coordination: Coordination normal.  Psychiatric:        Behavior: Behavior normal.        Thought Content: Thought content normal.        Judgment: Judgment normal.      ED Treatments / Results  Labs (all labs ordered are listed, but only abnormal results are displayed) Labs Reviewed - No data to display  EKG None  Radiology No results found.  Procedures Procedures (including critical care time)  Medications Ordered in ED Medications - No data to display   Initial Impression / Assessment and  Plan / ED Course  I have reviewed the triage vital signs and the nursing notes.  Pertinent labs & imaging results that were available during my care of the patient were reviewed by me and considered in my medical decision making (see chart for details).     Labs:   Imaging:  Consults:  Therapeutics:  Discharge Meds:   Assessment/Plan: 33 year old male presents today with cervical strain.  No neurological deficits no infection.  Discharged symptomatic care and strict return precautions.   Final Clinical Impressions(s) / ED Diagnoses   Final diagnoses:  Musculoskeletal pain    ED Discharge Orders         Ordered    acetaminophen (TYLENOL) 325 MG tablet  Every 6 hours PRN     08/14/18 1110           Eyvonne MechanicHedges, Aliah Eriksson, PA-C 08/14/18 1115    Bero, Elmer SowMichael M, MD 08/15/18 [phone]

## 2018-08-14 NOTE — ED Triage Notes (Signed)
Pt here for left sided neck pain and tightness.  States it feels like he slept on it wrong but pain is worse.  Burning pain to the left neck.  No breath difficulties.

## 2018-08-15 ENCOUNTER — Other Ambulatory Visit: Payer: Self-pay

## 2018-08-15 ENCOUNTER — Emergency Department (HOSPITAL_COMMUNITY)
Admission: EM | Admit: 2018-08-15 | Discharge: 2018-08-15 | Disposition: A | Discharge status: Home / Self Care | Payer: Self-pay | Attending: Emergency Medicine | Admitting: Emergency Medicine

## 2018-08-15 ENCOUNTER — Emergency Department (HOSPITAL_COMMUNITY)
Admission: EM | Admit: 2018-08-15 | Discharge: 2018-08-16 | Disposition: A | Discharge status: Home / Self Care | Payer: Self-pay | Attending: Emergency Medicine | Admitting: Emergency Medicine

## 2018-08-15 ENCOUNTER — Encounter (HOSPITAL_COMMUNITY): Payer: Self-pay | Admitting: Family Medicine

## 2018-08-15 ENCOUNTER — Encounter (HOSPITAL_COMMUNITY): Payer: Self-pay | Admitting: Emergency Medicine

## 2018-08-15 DIAGNOSIS — W57XXXA Bitten or stung by nonvenomous insect and other nonvenomous arthropods, initial encounter: Secondary | ICD-10-CM | POA: Insufficient documentation

## 2018-08-15 DIAGNOSIS — S40862A Insect bite (nonvenomous) of left upper arm, initial encounter: Secondary | ICD-10-CM | POA: Insufficient documentation

## 2018-08-15 DIAGNOSIS — Z590 Homelessness: Secondary | ICD-10-CM | POA: Insufficient documentation

## 2018-08-15 DIAGNOSIS — Y929 Unspecified place or not applicable: Secondary | ICD-10-CM | POA: Insufficient documentation

## 2018-08-15 DIAGNOSIS — Y939 Activity, unspecified: Secondary | ICD-10-CM | POA: Insufficient documentation

## 2018-08-15 DIAGNOSIS — M542 Cervicalgia: Secondary | ICD-10-CM | POA: Insufficient documentation

## 2018-08-15 DIAGNOSIS — Z79899 Other long term (current) drug therapy: Secondary | ICD-10-CM | POA: Insufficient documentation

## 2018-08-15 DIAGNOSIS — Y999 Unspecified external cause status: Secondary | ICD-10-CM | POA: Insufficient documentation

## 2018-08-15 DIAGNOSIS — F17210 Nicotine dependence, cigarettes, uncomplicated: Secondary | ICD-10-CM | POA: Insufficient documentation

## 2018-08-15 DIAGNOSIS — S20369A Insect bite (nonvenomous) of unspecified front wall of thorax, initial encounter: Secondary | ICD-10-CM | POA: Insufficient documentation

## 2018-08-15 DIAGNOSIS — R21 Rash and other nonspecific skin eruption: Secondary | ICD-10-CM

## 2018-08-15 DIAGNOSIS — S40861A Insect bite (nonvenomous) of right upper arm, initial encounter: Secondary | ICD-10-CM | POA: Insufficient documentation

## 2018-08-15 MED ORDER — DIPHENHYDRAMINE HCL 25 MG PO CAPS
50.0000 mg | ORAL_CAPSULE | Freq: Once | ORAL | Status: AC
  Administered 2018-08-15: 50 mg via ORAL
  Filled 2018-08-15: qty 2

## 2018-08-15 MED ORDER — PERMETHRIN 5 % EX CREA: TOPICAL_CREAM | CUTANEOUS | 0 refills | Status: DC

## 2018-08-15 NOTE — ED Triage Notes (Signed)
Woke up with neck pain 4 days ago.  Pain 1/10.  No known injury.

## 2018-08-15 NOTE — ED Triage Notes (Signed)
Patient reports he noticed a rash that developed on his left forearm that now is spread to his chest. Patient first notice it about 2 weeks ago. Associated symptom of itching.

## 2018-08-15 NOTE — Discharge Instructions (Signed)
You may use over-the-counter Benadryl 50 mg every 8 hours as needed for itching.   Steps to find a Primary Care Provider (PCP):  Call [phone] or [phone] to access "[facility] Find a Doctor Service."  2.  You may also go on the [facility] website at [email or web address]  3.  [facility] also frequently accepts new patients.  [facility]  [address] [place] [address] [phone]  4.  There are also multiple [facility], [facility] and [facility] practices throughout the [place] that are frequently accepting new patients. You may find a clinic that is close to your home and contact them.  [facility] [email or web address] [phone]  [email or web address]  [email or web address] [phone]  [email or web address] [phone]  5.  Local Health Departments also can provide primary care services.  [facility]  [address] [place] [address] [phone]  [facility] Department [address] [place] [address] [phone]  [facility] [address] [place] [address]  [place] [address] [phone]

## 2018-08-15 NOTE — ED Notes (Signed)
Bed: WTR6 Expected date:  Expected time:  Means of arrival:  Comments: 

## 2018-08-15 NOTE — ED Provider Notes (Signed)
TIME SEEN: 3:29 AM  CHIEF COMPLAINT: rash  HPI: Patient is a 33 year old male with history of homelessness who presents to the emergency department with a rash.  Rashes to his upper extremities and torso.  It is pruritic in nature.  No new exposures including no new soaps, lotions, detergents, medications or foods.  No other associated symptoms.  ROS: See HPI Constitutional: no fever  Eyes: no drainage  ENT: no runny nose   Cardiovascular:  no chest pain  Resp: no SOB  GI: no vomiting GU: no dysuria Integumentary: no rash  Allergy: no hives  Musculoskeletal: no leg swelling  Neurological: no slurred speech ROS otherwise negative  PAST MEDICAL HISTORY/PAST SURGICAL HISTORY:  History reviewed. No pertinent past medical history.  MEDICATIONS:  Prior to Admission medications   Medication Sig Start Date End Date Taking? Authorizing Provider  acetaminophen (TYLENOL) 325 MG tablet Take 2 tablets (650 mg total) by mouth every 6 (six) hours as needed. 08/14/18   Hedges, Tinnie GensJeffrey, PA-C  amoxicillin (AMOXIL) 500 MG capsule Take 1 capsule (500 mg total) by mouth 3 (three) times daily. 06/26/18   Antony MaduraHumes, Kelly, PA-C  ibuprofen (ADVIL,MOTRIN) 600 MG tablet Take 1 tablet (600 mg total) by mouth every 8 (eight) hours as needed for moderate pain. 06/06/18   Shaune PollackIsaacs, Cameron, MD  meloxicam (MOBIC) 15 MG tablet Take 1 tablet (15 mg total) by mouth daily. 07/26/18   Arthor CaptainHarris, Abigail, PA-C  omeprazole (PRILOSEC) 20 MG capsule Take 1 capsule (20 mg total) by mouth daily. 06/24/18   Roxy HorsemanBrowning, Robert, PA-C  penicillin v potassium (VEETID) 500 MG tablet Take 1 tablet (500 mg total) by mouth 4 (four) times daily. 05/01/18   Yigit Norkus, Layla MawKristen N, DO    ALLERGIES:  No Known Allergies  SOCIAL HISTORY:  Social History   Tobacco Use  . Smoking status: Current Every Day Smoker    Packs/day: 1.00    Years: 10.00    Pack years: 10.00    Types: Cigarettes  . Smokeless tobacco: Never Used  Substance Use Topics  .  Alcohol use: No    Frequency: Never    FAMILY HISTORY: Family History  Problem Relation Age of Onset  . Diabetes Mother   . Hypertension Mother   . Cancer Mother   . Cancer Father   . Diabetes Father   . Hypertension Father     EXAM: BP 129/74 (BP Location: Left Arm)   Pulse (!) 107   Temp 97.6 F (36.4 C) (Oral)   Resp 18   Ht 6\' 1"  (1.854 m)   Wt 77.1 kg   SpO2 100%   BMI 22.43 kg/m  CONSTITUTIONAL: Alert and oriented and responds appropriately to questions. Well-appearing; well-nourished HEAD: Normocephalic EYES: Conjunctivae clear, pupils appear equal, EOMI ENT: normal nose; moist mucous membranes NECK: Supple, no meningismus, no nuchal rigidity, no LAD  CARD: RRR; S1 and S2 appreciated; no murmurs, no clicks, no rubs, no gallops RESP: Normal chest excursion without splinting or tachypnea; breath sounds clear and equal bilaterally; no wheezes, no rhonchi, no rales, no hypoxia or respiratory distress, speaking full sentences ABD/GI: Normal bowel sounds; non-distended; soft, non-tender, no rebound, no guarding, no peritoneal signs, no hepatosplenomegaly BACK:  The back appears normal and is non-tender to palpation, there is no CVA tenderness EXT: Normal ROM in all joints; non-tender to palpation; no edema; normal capillary refill; no cyanosis, no calf tenderness or swelling    SKIN: Normal color for age and race; warm; scattered papules to his  upper extremities and torso with excoriations and scabs, no erythema or warmth, noted drainage, no blisters or desquamation, no petechiae or purpura NEURO: Moves all extremities equally PSYCH: The patient's mood and manner are appropriate. Grooming and personal hygiene are appropriate.  MEDICAL DECISION MAKING: Patient here with rash that is very mild.  Suspect possible bedbugs versus scabies but less likely scabies.  He states that he sleeps "wherever I can".  We will give him permethrin treatment.  Recommended Benadryl for the  itching.  No sign of any life-threatening rash.  No sign of superimposed infection.  Will give outpatient follow-up.  At this time, I do not feel there is any life-threatening condition present. I have reviewed and discussed all results (EKG, imaging, lab, urine as appropriate) and exam findings with patient/family. I have reviewed nursing notes and appropriate previous records.  I feel the patient is safe to be discharged home without further emergent workup and can continue workup as an outpatient as needed. Discussed usual and customary return precautions. Patient/family verbalize understanding and are comfortable with this plan.  Outpatient follow-up has been provided as needed. All questions have been answered.     Razi Hickle, Layla Maw, DO 08/15/18 [phone]

## 2018-08-16 MED ORDER — IBUPROFEN 800 MG PO TABS
800.0000 mg | ORAL_TABLET | Freq: Once | ORAL | Status: AC
  Administered 2018-08-16: 800 mg via ORAL
  Filled 2018-08-16: qty 1

## 2018-08-16 NOTE — ED Notes (Signed)
Reviewed d/c instructions with pt, who verbalized understanding and had no outstanding questions. Pt departed in NAD, refused use of wheelchair.   

## 2018-08-16 NOTE — ED Provider Notes (Signed)
TIME SEEN: 4:41 AM  CHIEF COMPLAINT: Neck pain  HPI: Patient is a 33 year old male with history of homelessness who is well-known to our emergency department who presents with neck pain.  States has been ongoing for 4 days.  Describes as a burning pain on the lateral aspects of the neck.  No injury.  Pain worse with movement.  Has not tried medications prior to arrival.  No numbness, tingling or focal weakness.  No bowel or bladder incontinence.  No fever.  ROS: See HPI Constitutional: no fever  Eyes: no drainage  ENT: no runny nose   Cardiovascular:  no chest pain  Resp: no SOB  GI: no vomiting GU: no dysuria Integumentary: no rash  Allergy: no hives  Musculoskeletal: no leg swelling  Neurological: no slurred speech ROS otherwise negative  PAST MEDICAL HISTORY/PAST SURGICAL HISTORY:  History reviewed. No pertinent past medical history.  MEDICATIONS:  Prior to Admission medications   Medication Sig Start Date End Date Taking? Authorizing Provider  acetaminophen (TYLENOL) 325 MG tablet Take 2 tablets (650 mg total) by mouth every 6 (six) hours as needed. 08/14/18   Hedges, Tinnie Gens, PA-C  amoxicillin (AMOXIL) 500 MG capsule Take 1 capsule (500 mg total) by mouth 3 (three) times daily. 06/26/18   Antony Madura, PA-C  ibuprofen (ADVIL,MOTRIN) 600 MG tablet Take 1 tablet (600 mg total) by mouth every 8 (eight) hours as needed for moderate pain. 06/06/18   Shaune Pollack, MD  meloxicam (MOBIC) 15 MG tablet Take 1 tablet (15 mg total) by mouth daily. 07/26/18   Arthor Captain, PA-C  omeprazole (PRILOSEC) 20 MG capsule Take 1 capsule (20 mg total) by mouth daily. 06/24/18   Roxy Horseman, PA-C  penicillin v potassium (VEETID) 500 MG tablet Take 1 tablet (500 mg total) by mouth 4 (four) times daily. 05/01/18   Sui Kasparek, Layla Maw, DO  permethrin (ELIMITE) 5 % cream Apply to affected area once 08/15/18   Marlane Hirschmann, Layla Maw, DO    ALLERGIES:  No Known Allergies  SOCIAL HISTORY:  Social History    Tobacco Use  . Smoking status: Current Every Day Smoker    Packs/day: 1.00    Years: 10.00    Pack years: 10.00    Types: Cigarettes  . Smokeless tobacco: Never Used  Substance Use Topics  . Alcohol use: No    Frequency: Never    FAMILY HISTORY: Family History  Problem Relation Age of Onset  . Diabetes Mother   . Hypertension Mother   . Cancer Mother   . Cancer Father   . Diabetes Father   . Hypertension Father     EXAM: BP 112/67 (BP Location: Left Arm)   Pulse 93   Temp 98.2 F (36.8 C) (Oral)   Resp 16   SpO2 100%  CONSTITUTIONAL: Alert and oriented and responds appropriately to questions. Well-appearing; well-nourished HEAD: Normocephalic EYES: Conjunctivae clear, pupils appear equal, EOMI ENT: normal nose; moist mucous membranes NECK: Supple, no meningismus, no nuchal rigidity, no LAD; no midline spinal tenderness or step-off or deformity no redness or warmth, no ecchymosis or swelling trachea midline CARD: RRR; S1 and S2 appreciated; no murmurs, no clicks, no rubs, no gallops RESP: Normal chest excursion without splinting or tachypnea; breath sounds clear and equal bilaterally; no wheezes, no rhonchi, no rales, no hypoxia or respiratory distress, speaking full sentences ABD/GI: Normal bowel sounds; non-distended; soft, non-tender, no rebound, no guarding, no peritoneal signs, no hepatosplenomegaly BACK:  The back appears normal and is non-tender to  palpation, there is no CVA tenderness EXT: Normal ROM in all joints; non-tender to palpation; no edema; normal capillary refill; no cyanosis, no calf tenderness or swelling    SKIN: Normal color for age and race; warm; no rash NEURO: Moves all extremities equally, sensation to light touch intact diffusely, strength 5/5 in all 4 extremities, normal gait, normal speech, no clonus PSYCH: The patient's mood and manner are appropriate. Grooming and personal hygiene are appropriate.  MEDICAL DECISION MAKING: Patient here  with neck pain.  Seems mostly complaining of pain over the paraspinal muscles bilaterally.  No known injury.  No focal neurologic deficits.  No fever.  Doubt meningitis.  Doubt cauda equina, epidural abscess or hematoma, discitis or osteomyelitis, transverse myelitis, spinal stenosis, cervical myelopathy.  Doubt fracture given no injury.  No midline tenderness on exam.  Recommended alternating Tylenol and Motrin for pain.  Given ibuprofen here in the ED.  I do not feel he needs further emergent work-up.  At this time, I do not feel there is any life-threatening condition present. I have reviewed and discussed all results (EKG, imaging, lab, urine as appropriate) and exam findings with patient/family. I have reviewed nursing notes and appropriate previous records.  I feel the patient is safe to be discharged home without further emergent workup and can continue workup as an outpatient as needed. Discussed usual and customary return precautions. Patient/family verbalize understanding and are comfortable with this plan.  Outpatient follow-up has been provided as needed. All questions have been answered.      Rosser Collington, Layla MawKristen N, DO 08/16/18 [phone]

## 2018-08-16 NOTE — Discharge Instructions (Signed)
You may alternate Tylenol 1000 mg every 6 hours as needed for pain and Ibuprofen 800 mg every 8 hours as needed for pain.  Please take Ibuprofen with food. ° °

## 2018-10-24 ENCOUNTER — Other Ambulatory Visit: Payer: Self-pay

## 2018-10-24 ENCOUNTER — Emergency Department (HOSPITAL_COMMUNITY)
Admission: EM | Admit: 2018-10-24 | Discharge: 2018-10-24 | Disposition: A | Discharge status: Home / Self Care | Payer: Self-pay | Attending: Emergency Medicine | Admitting: Emergency Medicine

## 2018-10-24 ENCOUNTER — Encounter (HOSPITAL_COMMUNITY): Payer: Self-pay

## 2018-10-24 DIAGNOSIS — K0401 Reversible pulpitis: Secondary | ICD-10-CM | POA: Insufficient documentation

## 2018-10-24 DIAGNOSIS — F17210 Nicotine dependence, cigarettes, uncomplicated: Secondary | ICD-10-CM | POA: Insufficient documentation

## 2018-10-24 DIAGNOSIS — Z79899 Other long term (current) drug therapy: Secondary | ICD-10-CM | POA: Insufficient documentation

## 2018-10-24 MED ORDER — IBUPROFEN 800 MG PO TABS
800.0000 mg | ORAL_TABLET | Freq: Once | ORAL | Status: AC
  Administered 2018-10-24: 800 mg via ORAL
  Filled 2018-10-24: qty 1

## 2018-10-24 MED ORDER — ACETAMINOPHEN 500 MG PO TABS
1000.0000 mg | ORAL_TABLET | Freq: Once | ORAL | Status: AC
  Administered 2018-10-24: 1000 mg via ORAL
  Filled 2018-10-24: qty 2

## 2018-10-24 MED ORDER — PENICILLIN V POTASSIUM 500 MG PO TABS: 1000.0000 mg | ORAL_TABLET | Freq: Two times a day (BID) | ORAL | 0 refills | Status: DC

## 2018-10-24 MED ORDER — OXYCODONE HCL 5 MG PO TABS
5.0000 mg | ORAL_TABLET | Freq: Once | ORAL | Status: AC
  Administered 2018-10-24: 5 mg via ORAL
  Filled 2018-10-24: qty 1

## 2018-10-24 MED ORDER — PENICILLIN V POTASSIUM 500 MG PO TABS
500.0000 mg | ORAL_TABLET | Freq: Once | ORAL | Status: AC
  Administered 2018-10-24: 500 mg via ORAL
  Filled 2018-10-24: qty 1

## 2018-10-24 NOTE — Discharge Instructions (Signed)
Take 4 over the counter ibuprofen tablets 3 times a day or 2 over-the-counter naproxen tablets twice a day for pain. Also take tylenol 1000mg (2 extra strength) four times a day.   Unfortunately our group has a policy that we do not prescribe narcotics for dental pain.  Please follow up with a dentist in the office.  Return for worsening pain, swelling, fever, difficulty swallowing.

## 2018-10-24 NOTE — ED Triage Notes (Signed)
Pt reports R upper dental pain that started last week. Denies fever.

## 2018-10-24 NOTE — ED Notes (Signed)
Bed: VZ56 Expected date: 10/24/18 Expected time: 9:19 PM Means of arrival:  Comments:

## 2018-10-24 NOTE — ED Provider Notes (Signed)
[facility]-EMERGENCY DEPT Provider Note   CSN: [id number] Arrival date & time: 10/24/18  2109    History   Chief Complaint Chief Complaint  Patient presents with  . Dental Pain    HPI [name] is a 33 y.o. male.     33 yo M with a chief complaint of right upper dental pain.  Going on for about a week or so.  Denies fevers denies chills denies difficulty swallowing.  Denies trauma to the mouth.  Has had dental pain off and on, has been a while since he seen a dentist.  Continues to smoke.  Pain is worse with eating and drinking.  Hot and cold.  The history is provided by the patient.  Dental Pain  Location:  Upper Upper teeth location:  1/RU 3rd molar Quality:  Aching and sharp Severity:  Moderate Onset quality:  Sudden Duration:  1 week Timing:  Constant Progression:  Worsening Chronicity:  New Relieved by:  Nothing Worsened by:  Nothing Ineffective treatments:  None tried Associated symptoms: no congestion, no facial swelling, no fever and no headaches     History reviewed. No pertinent past medical history.  There are no active problems to display for this patient.   History reviewed. No pertinent surgical history.      Home Medications    Prior to Admission medications   Medication Sig Start Date End Date Taking? Authorizing Provider  acetaminophen (TYLENOL) 325 MG tablet Take 2 tablets (650 mg total) by mouth every 6 (six) hours as needed. 08/14/18   Hedges, Tinnie Gens, PA-C  amoxicillin (AMOXIL) 500 MG capsule Take 1 capsule (500 mg total) by mouth 3 (three) times daily. 06/26/18   Antony Madura, PA-C  ibuprofen (ADVIL,MOTRIN) 600 MG tablet Take 1 tablet (600 mg total) by mouth every 8 (eight) hours as needed for moderate pain. 06/06/18   Shaune Pollack, MD  meloxicam (MOBIC) 15 MG tablet Take 1 tablet (15 mg total) by mouth daily. 07/26/18   Arthor Captain, PA-C  omeprazole (PRILOSEC) 20 MG capsule Take 1 capsule (20 mg total) by mouth  daily. 06/24/18   Roxy Horseman, PA-C  penicillin v potassium (VEETID) 500 MG tablet Take 2 tablets (1,000 mg total) by mouth 2 (two) times daily. X 7 days 10/24/18   Melene Plan, DO  permethrin Verner Mould) 5 % cream Apply to affected area once 08/15/18   Ward, Layla Maw, DO    Family History Family History  Problem Relation Age of Onset  . Diabetes Mother   . Hypertension Mother   . Cancer Mother   . Cancer Father   . Diabetes Father   . Hypertension Father     Social History Social History   Tobacco Use  . Smoking status: Current Every Day Smoker    Packs/day: 1.00    Years: 10.00    Pack years: 10.00    Types: Cigarettes  . Smokeless tobacco: Never Used  Substance Use Topics  . Alcohol use: No    Frequency: Never  . Drug use: Yes    Types: Marijuana, Methamphetamines    Comment: Herion     Allergies   Patient has no known allergies.   Review of Systems Review of Systems  Constitutional: Negative for chills and fever.  HENT: Positive for dental problem. Negative for congestion and facial swelling.   Eyes: Negative for discharge and visual disturbance.  Respiratory: Negative for shortness of breath.   Cardiovascular: Negative for chest pain and palpitations.  Gastrointestinal:  Negative for abdominal pain, diarrhea and vomiting.  Musculoskeletal: Negative for arthralgias and myalgias.  Skin: Negative for color change and rash.  Neurological: Negative for tremors, syncope and headaches.  Psychiatric/Behavioral: Negative for confusion and dysphoric mood.     Physical Exam Updated Vital Signs BP 130/88 (BP Location: Right Arm)   Pulse 98   Temp 98.6 F (37 C) (Oral)   Resp 16   Ht 6\' 1"  (1.854 m)   Wt 77.1 kg   SpO2 100%   BMI 22.43 kg/m   Physical Exam Vitals signs and nursing note reviewed.  Constitutional:      Appearance: He is well-developed.  HENT:     Head: Normocephalic and atraumatic.     Comments: Diffuse poor dentition.  No edema, no  erythema.  Tolerating secretions without difficulty.  Eyes:     Pupils: Pupils are equal, round, and reactive to light.  Neck:     Musculoskeletal: Normal range of motion and neck supple.     Vascular: No JVD.  Cardiovascular:     Rate and Rhythm: Normal rate and regular rhythm.     Heart sounds: No murmur. No friction rub. No gallop.   Pulmonary:     Effort: No respiratory distress.     Breath sounds: No wheezing.  Abdominal:     General: There is no distension.     Tenderness: There is no guarding or rebound.  Musculoskeletal: Normal range of motion.  Skin:    Coloration: Skin is not pale.     Findings: No rash.  Neurological:     Mental Status: He is alert and oriented to person, place, and time.  Psychiatric:        Behavior: Behavior normal.      ED Treatments / Results  Labs (all labs ordered are listed, but only abnormal results are displayed) Labs Reviewed - No data to display  EKG None  Radiology No results found.  Procedures Procedures (including critical care time)  Medications Ordered in ED Medications  acetaminophen (TYLENOL) tablet 1,000 mg (1,000 mg Oral Given 10/24/18 2153)  oxyCODONE (Oxy IR/ROXICODONE) immediate release tablet 5 mg (5 mg Oral Given 10/24/18 2153)  ibuprofen (ADVIL,MOTRIN) tablet 800 mg (800 mg Oral Given 10/24/18 2153)  penicillin v potassium (VEETID) tablet 500 mg (500 mg Oral Given 10/24/18 2153)     Initial Impression / Assessment and Plan / ED Course  I have reviewed the triage vital signs and the nursing notes.  Pertinent labs & imaging results that were available during my care of the patient were reviewed by me and considered in my medical decision making (see chart for details).        33 yo M with right upper dental pain.  No clinical signs of abscess.  Most likely this is pulpitis.  We will have him take Tylenol and ibuprofen.  Given a list of dentists.  10:24 PM:  I have discussed the diagnosis/risks/treatment options  with the patient and believe the pt to be eligible for discharge home to follow-up with PCP, dentist. We also discussed returning to the ED immediately if new or worsening sx occur. We discussed the sx which are most concerning (e.g., sudden worsening pain, fever, inability to tolerate by mouth) that necessitate immediate return. Medications administered to the patient during their visit and any new prescriptions provided to the patient are listed below.  Medications given during this visit Medications  acetaminophen (TYLENOL) tablet 1,000 mg (1,000 mg Oral Given 10/24/18 2153)  oxyCODONE (Oxy IR/ROXICODONE) immediate release tablet 5 mg (5 mg Oral Given 10/24/18 2153)  ibuprofen (ADVIL,MOTRIN) tablet 800 mg (800 mg Oral Given 10/24/18 2153)  penicillin v potassium (VEETID) tablet 500 mg (500 mg Oral Given 10/24/18 2153)     The patient appears reasonably screen and/or stabilized for discharge and I doubt any other medical condition or other [facility] requiring further screening, evaluation, or treatment in the ED at this time prior to discharge.    Final Clinical Impressions(s) / ED Diagnoses   Final diagnoses:  Pulpitis    ED Discharge Orders         Ordered    penicillin v potassium (VEETID) 500 MG tablet  2 times daily     10/24/18 2146           Melene PlanFloyd, Maiana Hennigan, DO 10/24/18 2224

## 2018-11-22 ENCOUNTER — Emergency Department (HOSPITAL_COMMUNITY)
Admission: EM | Admit: 2018-11-22 | Discharge: 2018-11-23 | Disposition: A | Discharge status: Home / Self Care | Payer: Self-pay | Attending: Emergency Medicine | Admitting: Emergency Medicine

## 2018-11-22 ENCOUNTER — Other Ambulatory Visit: Payer: Self-pay

## 2018-11-22 ENCOUNTER — Encounter (HOSPITAL_COMMUNITY): Payer: Self-pay | Admitting: Emergency Medicine

## 2018-11-22 DIAGNOSIS — F1290 Cannabis use, unspecified, uncomplicated: Secondary | ICD-10-CM | POA: Insufficient documentation

## 2018-11-22 DIAGNOSIS — Z590 Homelessness: Secondary | ICD-10-CM | POA: Insufficient documentation

## 2018-11-22 DIAGNOSIS — F17210 Nicotine dependence, cigarettes, uncomplicated: Secondary | ICD-10-CM | POA: Insufficient documentation

## 2018-11-22 DIAGNOSIS — F1110 Opioid abuse, uncomplicated: Secondary | ICD-10-CM | POA: Insufficient documentation

## 2018-11-22 DIAGNOSIS — M79671 Pain in right foot: Secondary | ICD-10-CM | POA: Insufficient documentation

## 2018-11-22 DIAGNOSIS — Z79899 Other long term (current) drug therapy: Secondary | ICD-10-CM | POA: Insufficient documentation

## 2018-11-22 MED ORDER — IBUPROFEN 400 MG PO TABS
600.0000 mg | ORAL_TABLET | Freq: Once | ORAL | Status: AC
  Administered 2018-11-23: 600 mg via ORAL
  Filled 2018-11-22: qty 1

## 2018-11-22 NOTE — ED Triage Notes (Signed)
Reports pain to right ankle for the last 4-5 days.  His friend thinks it could be cellulitis.

## 2018-11-22 NOTE — ED Provider Notes (Signed)
[facility] EMERGENCY DEPARTMENT Provider Note  CSN: [id number] Arrival date & time: 11/22/18 2053  Chief Complaint(s) Ankle Pain  HPI [name] is a 33 y.o. male with a history of homelessness and methamphetamine needing use presents to the emergency department with several days of right foot pain.  Patient reports that he walks long distances due to being homeless.  Right foot pain described as achiness and throbbing in nature.  Alleviated by mobility.  Exacerbated by ambulation.  No pain with range of motion of the ankle.  No overlying erythema.  No fevers or chills.  Denies any falls or known trauma.  Patient denies any IV drug use.  He was told by a friend that it might be cellulitis due to his drug use.  HPI  Past Medical History History reviewed. No pertinent past medical history. There are no active problems to display for this patient.  Home Medication(s) Prior to Admission medications   Medication Sig Start Date End Date Taking? Authorizing Provider  acetaminophen (TYLENOL) 325 MG tablet Take 2 tablets (650 mg total) by mouth every 6 (six) hours as needed. 08/14/18   Hedges, Tinnie Gens, PA-C  amoxicillin (AMOXIL) 500 MG capsule Take 1 capsule (500 mg total) by mouth 3 (three) times daily. 06/26/18   Antony Madura, PA-C  ibuprofen (ADVIL,MOTRIN) 600 MG tablet Take 1 tablet (600 mg total) by mouth every 8 (eight) hours as needed for moderate pain. 06/06/18   Shaune Pollack, MD  meloxicam (MOBIC) 15 MG tablet Take 1 tablet (15 mg total) by mouth daily. 07/26/18   Arthor Captain, PA-C  omeprazole (PRILOSEC) 20 MG capsule Take 1 capsule (20 mg total) by mouth daily. 06/24/18   Roxy Horseman, PA-C  penicillin v potassium (VEETID) 500 MG tablet Take 2 tablets (1,000 mg total) by mouth 2 (two) times daily. X 7 days 10/24/18   Melene Plan, DO  permethrin Verner Mould) 5 % cream Apply to affected area once 08/15/18   Ward, Layla Maw, DO                                                                                      Past Surgical History History reviewed. No pertinent surgical history. Family History Family History  Problem Relation Age of Onset  . Diabetes Mother   . Hypertension Mother   . Cancer Mother   . Cancer Father   . Diabetes Father   . Hypertension Father     Social History Social History   Tobacco Use  . Smoking status: Current Every Day Smoker    Packs/day: 1.00    Years: 10.00    Pack years: 10.00    Types: Cigarettes  . Smokeless tobacco: Never Used  Substance Use Topics  . Alcohol use: No    Frequency: Never  . Drug use: Yes    Types: Marijuana, Methamphetamines    Comment: Herion   Allergies Patient has no known allergies.  Review of Systems Review of Systems As noted in HPI Physical Exam Vital Signs  I have reviewed the triage vital signs BP 122/81   Pulse 86   Resp 18   Ht  (1.854 m)  Wt 77.1 kg   SpO2 99%   BMI 22.43 kg/m   Physical Exam Vitals signs reviewed.  Constitutional:      General: He is not in acute distress.    Appearance: He is well-developed. He is not diaphoretic.  HENT:     Head: Normocephalic and atraumatic.     Jaw: No trismus.     Right Ear: External ear normal.     Left Ear: External ear normal.     Nose: Nose normal.  Eyes:     General: No scleral icterus.    Conjunctiva/sclera: Conjunctivae normal.  Neck:     Musculoskeletal: Normal range of motion.     Trachea: Phonation normal.  Cardiovascular:     Rate and Rhythm: Normal rate and regular rhythm.     Pulses:          Dorsalis pedis pulses are 2+ on the right side and 2+ on the left side.       Posterior tibial pulses are 2+ on the right side and 2+ on the left side.  Pulmonary:     Effort: Pulmonary effort is normal. No respiratory distress.     Breath sounds: No stridor.  Abdominal:     General: There is no distension.  Musculoskeletal: Normal range of motion.     Right  foot: Normal range of motion and normal capillary refill. Tenderness (mild discomfort to palpation) and swelling present. No deformity.       Feet:  Feet:     Comments: Flat-footed Neurological:     Mental Status: He is alert and oriented to person, place, and time.  Psychiatric:        Behavior: Behavior normal.     ED Results and Treatments Labs (all labs ordered are listed, but only abnormal results are displayed) Labs Reviewed - No data to display                                                                                                                       EKG  EKG Interpretation  Date/Time:    Ventricular Rate:    PR Interval:    QRS Duration:   QT Interval:    QTC Calculation:   R Axis:     Text Interpretation:        Radiology No results found. Pertinent labs & imaging results that were available during my care of the patient were reviewed by me and considered in my medical decision making (see chart for details).  Medications Ordered in ED Medications  ibuprofen (ADVIL) tablet 600 mg (has no administration in time range)  Procedures Procedures  (including critical care time)  Medical Decision Making / ED Course I have reviewed the nursing notes for this encounter and the patient's prior records (if available in EHR or on provided paperwork).    Right foot pain.  No reported or evidence of trauma.  No evidence of cellulitis.  Low suspicion for septic arthritis.  Likely overuse.  Provided with ASO and Motrin.  Supportive management recommended.  The patient appears reasonably screened and/or stabilized for discharge and I doubt any other medical condition or other [facility] requiring further screening, evaluation, or treatment in the ED at this time prior to discharge.  The patient is safe for discharge with strict return  precautions.   Final Clinical Impression(s) / ED Diagnoses Final diagnoses:  Right foot pain    Disposition: Discharge  Condition: Good  I have discussed the results, Dx and Tx plan with the patient who expressed understanding and agree(s) with the plan. Discharge instructions discussed at great length. The patient was given strict return precautions who verbalized understanding of the instructions. No further questions at time of discharge.    ED Discharge Orders    None        This chart was dictated using voice recognition software.  Despite best efforts to proofread,  errors can occur which can change the documentation meaning.   Nira Conn, MD 11/23/18 [phone]

## 2018-11-23 NOTE — ED Notes (Signed)
Patient verbalizes understanding of discharge instructions. Opportunity for questioning and answers were provided. Armband removed by staff, pt discharged from ED by wheelchair   

## 2018-11-24 ENCOUNTER — Encounter (HOSPITAL_COMMUNITY): Payer: Self-pay

## 2018-11-24 ENCOUNTER — Other Ambulatory Visit: Payer: Self-pay

## 2018-11-24 ENCOUNTER — Emergency Department (HOSPITAL_COMMUNITY)
Admission: EM | Admit: 2018-11-24 | Discharge: 2018-11-24 | Disposition: A | Discharge status: Home / Self Care | Payer: Self-pay | Attending: Emergency Medicine | Admitting: Emergency Medicine

## 2018-11-24 DIAGNOSIS — F17210 Nicotine dependence, cigarettes, uncomplicated: Secondary | ICD-10-CM | POA: Insufficient documentation

## 2018-11-24 DIAGNOSIS — R600 Localized edema: Secondary | ICD-10-CM | POA: Insufficient documentation

## 2018-11-24 DIAGNOSIS — Z79899 Other long term (current) drug therapy: Secondary | ICD-10-CM | POA: Insufficient documentation

## 2018-11-24 DIAGNOSIS — M79671 Pain in right foot: Secondary | ICD-10-CM | POA: Insufficient documentation

## 2018-11-24 DIAGNOSIS — Z590 Homelessness: Secondary | ICD-10-CM | POA: Insufficient documentation

## 2018-11-24 MED ORDER — IBUPROFEN 200 MG PO TABS
600.0000 mg | ORAL_TABLET | Freq: Once | ORAL | Status: AC
  Administered 2018-11-24: 600 mg via ORAL
  Filled 2018-11-24: qty 3

## 2018-11-24 NOTE — Discharge Instructions (Addendum)
Thank you for allowing me to care for you today in the Emergency Department.   Try to elevate your leg so that your toes are at or above the level of your nose when sitting and resting.  Compression stockings are available over-the-counter.  You can try wearing compression stockings if you were going to be standing or walking for long periods of time to help with swelling.  Take 600 mg of ibuprofen with food every 6 hours for pain control.  Return to the emergency department if you develop severe pain that seems to be going up the calf, high fever or chills, thick, mucus-like drainage from the area, or other new, concerning symptoms.

## 2018-11-24 NOTE — ED Triage Notes (Addendum)
Pt reports R ankle pain for a week. He states that he thinks it is cellulitis from his drug use. He denies injury

## 2018-11-24 NOTE — ED Provider Notes (Signed)
[facility]-EMERGENCY DEPT Provider Note   CSN: [id number] Arrival date & time: 11/24/18  0144    History   Chief Complaint Chief Complaint  Patient presents with  . Ankle Pain    R    HPI [name] is a 33 y.o. 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  States "I think I may have cellulitis of my foot because of my amphetamine use."  He reports that he walks long distances due to being homeless.  Right foot pain is a constant and throbbing.  Pain is worse with walking and alleviated somewhat with rest.  He denies right calf pain, right knee pain, numbness, weakness, fever, or chills.  No recent falls or injuries.  He was evaluated in the ER 2 days ago for the same and was discharged with an ASO brace, which he is not wearing in the ER today.  He reports improvement with ibuprofen, last dose was yesterday.     The history is provided by the patient. No language interpreter was used.    History reviewed. No pertinent past medical history.  There are no active problems to display for this patient.   History reviewed. No pertinent surgical history.      Home Medications    Prior to Admission medications   Medication Sig Start Date End Date Taking? Authorizing Provider  acetaminophen (TYLENOL) 325 MG tablet Take 2 tablets (650 mg total) by mouth every 6 (six) hours as needed. 08/14/18   Hedges, Tinnie Gens, PA-C  amoxicillin (AMOXIL) 500 MG capsule Take 1 capsule (500 mg total) by mouth 3 (three) times daily. 06/26/18   Antony Madura, PA-C  ibuprofen (ADVIL,MOTRIN) 600 MG tablet Take 1 tablet (600 mg total) by mouth every 8 (eight) hours as needed for moderate pain. 06/06/18   Shaune Pollack, MD  meloxicam (MOBIC) 15 MG tablet Take 1 tablet (15 mg total) by mouth daily. 07/26/18   Arthor Captain, PA-C  omeprazole (PRILOSEC) 20 MG capsule Take 1 capsule (20 mg  total) by mouth daily. 06/24/18   Roxy Horseman, PA-C  penicillin v potassium (VEETID) 500 MG tablet Take 2 tablets (1,000 mg total) by mouth 2 (two) times daily. X 7 days 10/24/18   Melene Plan, DO  permethrin Verner Mould) 5 % cream Apply to affected area once 08/15/18   Ward, Layla Maw, DO    Family History Family History  Problem Relation Age of Onset  . Diabetes Mother   . Hypertension Mother   . Cancer Mother   . Cancer Father   . Diabetes Father   . Hypertension Father     Social History Social History   Tobacco Use  . Smoking status: Current Every Day Smoker    Packs/day: 1.00    Years: 10.00    Pack years: 10.00    Types: Cigarettes  . Smokeless tobacco: Never Used  Substance Use Topics  . Alcohol use: No    Frequency: Never  . Drug use: Yes    Types: Marijuana, Methamphetamines    Comment: Herion     Allergies   Patient has no known allergies.   Review of Systems Review of Systems  Constitutional: Negative for activity change.  Respiratory: Negative for shortness of breath.   Cardiovascular: Negative for chest pain.  Gastrointestinal: Negative for abdominal pain.  Musculoskeletal: Positive for arthralgias, gait problem and myalgias. Negative for back pain, neck pain and  neck stiffness.  Skin: Negative for color change, rash and wound.  Neurological: Negative for weakness and numbness.     Physical Exam Updated Vital Signs BP 128/77   Pulse 84   Temp 98.2 F (36.8 C)   Resp 14   Ht  (1.854 m)   Wt 77.1 kg   SpO2 99%   BMI 22.43 kg/m   Physical Exam Vitals signs and nursing note reviewed.  Constitutional:      Appearance: He is well-developed.  HENT:     Head: Normocephalic.  Eyes:     Conjunctiva/sclera: Conjunctivae normal.  Neck:     Musculoskeletal: Neck supple.  Cardiovascular:     Rate and Rhythm: Normal rate and regular rhythm.     Heart sounds: No murmur.  Pulmonary:     Effort: Pulmonary effort is normal.  Abdominal:      General: There is no distension.     Palpations: Abdomen is soft.  Musculoskeletal:     Right lower leg: No edema.     Left lower leg: No edema.     Comments: TTP to the right foot.  There is a mild swelling inferior to the lateral malleolus of the right foot.  No associated warmth or redness.  Full active and passive range of motion of the right ankle and knee.  No tenderness to the medial or lateral malleolus of the right foot.  No crepitus or step-offs.  Independently moves all digits of the bilateral feet.  DP and PT pulses are 2+ and symmetric.  Sensation is intact and equal throughout.  Ambulatory without difficulty. Patient has pes planus.  Bilateral calves are nontender to palpation.  No swelling to the bilateral calves.  Negative Homans sign.  Skin:    General: Skin is warm and dry.  Neurological:     Mental Status: He is alert.  Psychiatric:        Behavior: Behavior normal.      ED Treatments / Results  Labs (all labs ordered are listed, but only abnormal results are displayed) Labs Reviewed - No data to display  EKG None  Radiology No results found.  Procedures Procedures (including critical care time)  Medications Ordered in ED Medications  ibuprofen (ADVIL) tablet 600 mg (600 mg Oral Given 11/24/18 0232)     Initial Impression / Assessment and Plan / ED Course  I have reviewed the triage vital signs and the nursing notes.  Pertinent labs & imaging results that were available during my care of the patient were reviewed by me and considered in my medical decision making (see chart for details).        33 year old male with history of homelessness and methamphetamine use who is well-known to this emergency department with 14 visits in the last 6 months.  He is presenting with right foot pain.  He was evaluated for the same in the ER 2 days ago and was given an ASO brace and ibuprofen.  ASO brace is not present with the patient in the ER today.  On exam, he has  some mild swelling inferior to the right lateral malleolus.  No tenderness over the lateral malleolus.  Full active and passive range of motion of the right ankle.  No tenderness over the base of the right fifth metatarsal.  Low suspicion for gout, cellulitis, or septic joint.  He has no focal tenderness that suggest fracture.  Low suspicion for occult fracture.  He was given ibuprofen in the ER.  Recommended RICE therapy. [facility]  Strict return precautions given.  He is hemodynamically stable and in no acute distress.  Safe for discharge with outpatient follow-up.  Final Clinical Impressions(s) / ED Diagnoses   Final diagnoses:  Right foot pain    ED Discharge Orders    None       Barkley BoardsMcDonald, Mia A, PA-C 11/24/18 0524    Nira Connardama, Pedro Eduardo, MD 11/25/18 0700

## 2018-12-14 ENCOUNTER — Other Ambulatory Visit: Payer: Self-pay

## 2018-12-14 ENCOUNTER — Encounter (HOSPITAL_COMMUNITY): Payer: Self-pay | Admitting: Emergency Medicine

## 2018-12-14 ENCOUNTER — Emergency Department (HOSPITAL_COMMUNITY)
Admission: EM | Admit: 2018-12-14 | Discharge: 2018-12-14 | Disposition: A | Discharge status: Home / Self Care | Payer: Self-pay | Attending: Emergency Medicine | Admitting: Emergency Medicine

## 2018-12-14 DIAGNOSIS — Z79899 Other long term (current) drug therapy: Secondary | ICD-10-CM | POA: Insufficient documentation

## 2018-12-14 DIAGNOSIS — R21 Rash and other nonspecific skin eruption: Secondary | ICD-10-CM | POA: Insufficient documentation

## 2018-12-14 DIAGNOSIS — Z590 Homelessness: Secondary | ICD-10-CM | POA: Insufficient documentation

## 2018-12-14 DIAGNOSIS — F17210 Nicotine dependence, cigarettes, uncomplicated: Secondary | ICD-10-CM | POA: Insufficient documentation

## 2018-12-14 MED ORDER — TRIAMCINOLONE ACETONIDE 0.5 % EX OINT: 1.0000 "application " | TOPICAL_OINTMENT | Freq: Two times a day (BID) | CUTANEOUS | 0 refills | Status: DC

## 2018-12-14 NOTE — Care Management (Signed)
Patient is homeless and needs assistance with prescriptions. Enrolled in [facility] program waived the copay  [facility] letter to [facility] on [address]. Patient was made aware where to pick up medication, no further ED CM needs identified. Patient will f/u with Lavinia Sharps NP at the [facility].

## 2018-12-14 NOTE — ED Triage Notes (Signed)
Rash reported to abdomen and groin for a week.  Reports as itchy.

## 2018-12-14 NOTE — Discharge Instructions (Addendum)
Thank you for allowing me to care for you today in the Emergency Department.   Thin layer of Kenalog ointment to the rash 2 times daily.  Stop using the ointment as soon as the rash resolves because this can cause thinning of the skin with long-term use.  Follow up with [facility] if your symptoms do not start to improve with treatment in the next week.   Benadryl cream or Benadryl tablets are available over-the-counter to help with itching.  Return to the emergency department if you develop a fever, if you start having a thick, mucus-like drainage from the area, if the area gets hot to the touch, if you develop fever chills, or other new, concerning symptoms.

## 2018-12-14 NOTE — ED Provider Notes (Signed)
[name] EMERGENCY DEPARTMENT Provider Note   CSN: [id number] Arrival date & time: 12/14/18  2108    History   Chief Complaint Chief Complaint  Patient presents with   Rash    HPI [name] is a 33 y.o. male with a history of methamphetamine use and homelessness who presents to the emergency department with a chief complaint of rash.  The patient endorses an erythematous, circular, excoriated rash to the lower abdomen that began approximately 2 weeks ago followed by a similar smaller rash to the upper mid abdomen.  The rash has not enlarged since onset.  He denies fever, chills, purulent discharge, or warmth.  No treatment prior to arrival.  No known aggravating or alleviating factors.  He denies new soaps, lotions, detergents, foods, medications.  He reports that he has had cellulitis in the past and is concerned this is the same.      The history is provided by the patient. No language interpreter was used.  Rash  Location:  Torso Torso rash location: lower abdomen. Quality: itchiness, redness and scaling   Quality: not blistering, not bruising, not burning, not draining, not peeling, not swelling and not weeping   Severity:  Unable to specify Duration:  2 weeks Timing:  Constant Progression:  Unchanged Context: not chemical exposure, not diapers, not insect bite/sting, not medications, not new detergent/soap, not nuts, not plant contact, not pollen, not sick contacts and not sun exposure   Relieved by:  Nothing Worsened by:  Nothing Ineffective treatments:  None tried Associated symptoms: no abdominal pain, no fever, no headaches and no shortness of breath     History reviewed. No pertinent past medical history.  There are no active problems to display for this patient.   History reviewed. No pertinent surgical history.      Home Medications    Prior to Admission medications   Medication Sig Start Date End Date Taking? Authorizing Provider    acetaminophen (TYLENOL) 325 MG tablet Take 2 tablets (650 mg total) by mouth every 6 (six) hours as needed. 08/14/18   Hedges, Tinnie Gens, PA-C  amoxicillin (AMOXIL) 500 MG capsule Take 1 capsule (500 mg total) by mouth 3 (three) times daily. 06/26/18   Antony Madura, PA-C  ibuprofen (ADVIL,MOTRIN) 600 MG tablet Take 1 tablet (600 mg total) by mouth every 8 (eight) hours as needed for moderate pain. 06/06/18   Shaune Pollack, MD  meloxicam (MOBIC) 15 MG tablet Take 1 tablet (15 mg total) by mouth daily. 07/26/18   Arthor Captain, PA-C  omeprazole (PRILOSEC) 20 MG capsule Take 1 capsule (20 mg total) by mouth daily. 06/24/18   Roxy Horseman, PA-C  penicillin v potassium (VEETID) 500 MG tablet Take 2 tablets (1,000 mg total) by mouth 2 (two) times daily. X 7 days 10/24/18   Melene Plan, DO  permethrin (ELIMITE) 5 % cream Apply to affected area once 08/15/18   Ward, Layla Maw, DO  triamcinolone ointment (KENALOG) 0.5 % Apply 1 application topically 2 (two) times daily. 12/14/18   Kacey Vicuna, Coral Else, PA-C    Family History Family History  Problem Relation Age of Onset   Diabetes Mother    Hypertension Mother    Cancer Mother    Cancer Father    Diabetes Father    Hypertension Father     Social History Social History   Tobacco Use   Smoking status: Current Every Day Smoker    Packs/day: 1.00    Years: 10.00  Pack years: 10.00    Types: Cigarettes   Smokeless tobacco: Never Used  Substance Use Topics   Alcohol use: No    Frequency: Never   Drug use: Yes    Types: Marijuana, Methamphetamines    Comment: Herion     Allergies   Patient has no known allergies.   Review of Systems Review of Systems  Constitutional: Negative for appetite change, chills and fever.  Respiratory: Negative for shortness of breath.   Cardiovascular: Negative for chest pain.  Gastrointestinal: Negative for abdominal pain.  Genitourinary: Negative for dysuria.  Musculoskeletal: Negative for back  pain.  Skin: Positive for color change and rash. Negative for wound.  Allergic/Immunologic: Negative for immunocompromised state.  Neurological: Negative for dizziness, weakness, light-headedness and headaches.  Psychiatric/Behavioral: Negative for confusion.     Physical Exam Updated Vital Signs BP 115/71    Pulse 86    Temp 98.1 F (36.7 C) (Oral)    Resp 16    Ht 6\' 1"  (1.854 m)    Wt 77.1 kg    SpO2 99%    BMI 22.43 kg/m   Physical Exam Vitals signs and nursing note reviewed.  Constitutional:      Appearance: He is well-developed.  HENT:     Head: Normocephalic.  Eyes:     Conjunctiva/sclera: Conjunctivae normal.  Neck:     Musculoskeletal: Neck supple.  Cardiovascular:     Rate and Rhythm: Normal rate and regular rhythm.     Heart sounds: No murmur.  Pulmonary:     Effort: Pulmonary effort is normal.  Abdominal:     General: There is no distension.     Palpations: Abdomen is soft.  Skin:    General: Skin is warm and dry.     Comments: Erythematous, circular, well-demarcated, excoriated, blanching plaque noted to the right lower abdomen.  There is a second lesion noted to the mid lower abdomen, just inferior to the umbilicus.  The patient appears to have recently shaved to this area recently, but reports that the rash did not seem to correlate with the timing of shaving.  There is no warmth.  No central clearing.  No associated vesicles, bulla, pustules, cysts  Neurological:     Mental Status: He is alert.  Psychiatric:        Behavior: Behavior normal.          ED Treatments / Results  Labs (all labs ordered are listed, but only abnormal results are displayed) Labs Reviewed - No data to display  EKG None  Radiology No results found.  Procedures Procedures (including critical care time)  Medications Ordered in ED Medications - No data to display   Initial Impression / Assessment and Plan / ED Course  I have reviewed the triage vital signs and the  nursing notes.  Pertinent labs & imaging results that were available during my care of the patient were reviewed by me and considered in my medical decision making (see chart for details).  Clinical Course as of Dec 13 2300  Fri Dec 14, 2018  2256 Received a call from Anola GurneyWanda Rogers with social worker. Will call medication into [facility] on [address] and waive the patient's co-pay. Prescription has been changed and AVS updated.    [MM]    Clinical Course User Index [MM] Dayanara Sherrill A, PA-C       33 year old male presenting with a rash to the lower abdomen for the last 2 weeks.  On exam, rash is circular,  excoriated, erythematous with scaling.  There is no warmth, drainage, vesicles, pustules, central clearing.  The patient has no constitutional symptoms.  Rash has not enlarged in size since onset.  Low suspicion for cellulitis, folliculitis, or candidal intertriginous rash.  Considered tinea cruris or corporis, and tinea barbae, but the second patch does not appear to be a satellite lesion.  It does not appear as of multiple plaques of coalesced.  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  Lower suspicion for contact dermatitis as the patient has had no new risk factors.  I also reviewed the patient's medical record, but it appears as if the patient has been treated for many different rashes over the last few years.  Will treat with ketoconazole cream and have the patient follow-up with [facility] at the [facility].   Final Clinical Impressions(s) / ED Diagnoses   Final diagnoses:  Rash and nonspecific skin eruption    ED Discharge Orders         Ordered    triamcinolone ointment (KENALOG) 0.5 %  2 times daily,   Status:  Discontinued     12/14/18 2234    triamcinolone ointment (KENALOG) 0.5 %  2 times daily     12/14/18 2253           Omaira Mellen A, PA-C 12/14/18 2303    Cathren Laine, MD 12/15/18 1526

## 2018-12-17 ENCOUNTER — Encounter (HOSPITAL_COMMUNITY): Payer: Self-pay | Admitting: *Deleted

## 2018-12-17 ENCOUNTER — Encounter (HOSPITAL_COMMUNITY): Payer: Self-pay

## 2018-12-17 ENCOUNTER — Emergency Department (HOSPITAL_COMMUNITY)
Admission: EM | Admit: 2018-12-17 | Discharge: 2018-12-17 | Disposition: A | Discharge status: Home / Self Care | Payer: Self-pay | Attending: Emergency Medicine | Admitting: Emergency Medicine

## 2018-12-17 ENCOUNTER — Emergency Department (HOSPITAL_COMMUNITY): Admission: EM | Admit: 2018-12-17 | Discharge: 2018-12-17 | Discharge status: Against Medical Advice | Payer: Self-pay

## 2018-12-17 ENCOUNTER — Other Ambulatory Visit: Payer: Self-pay

## 2018-12-17 DIAGNOSIS — R21 Rash and other nonspecific skin eruption: Secondary | ICD-10-CM | POA: Insufficient documentation

## 2018-12-17 DIAGNOSIS — F17210 Nicotine dependence, cigarettes, uncomplicated: Secondary | ICD-10-CM | POA: Insufficient documentation

## 2018-12-17 DIAGNOSIS — B354 Tinea corporis: Secondary | ICD-10-CM | POA: Insufficient documentation

## 2018-12-17 MED ORDER — CLOTRIMAZOLE-BETAMETHASONE 1-0.05 % EX CREA
TOPICAL_CREAM | Freq: Two times a day (BID) | CUTANEOUS | Status: DC
  Filled 2018-12-17: qty 15

## 2018-12-17 MED ORDER — CLOTRIMAZOLE-BETAMETHASONE 1-0.05 % EX CREA: TOPICAL_CREAM | CUTANEOUS | 0 refills | Status: DC

## 2018-12-17 NOTE — ED Provider Notes (Signed)
[name] EMERGENCY DEPARTMENT Provider Note   CSN: [id number] Arrival date & time: 12/17/18  2152    History   Chief Complaint Chief Complaint  Patient presents with  . Rash    HPI [name] is a 33 y.o. male.     33 year old male presenting to the emergency department for evaluation of a rash to his abdomen x2 weeks.  He was evaluated 3 days ago for this rash and has been using topical Kenalog for symptoms without relief.  He subjectively feels as though this cream has been making his rash worse.  Rash is itching without drainage.  No associated fevers.  No significant spreading of the rash.  It began after he stayed at a [employer].  He has not followed up with a primary care doctor since his last visit.  The history is provided by the patient. No language interpreter was used.  Rash    History reviewed. No pertinent past medical history.  There are no active problems to display for this patient.   History reviewed. No pertinent surgical history.      Home Medications    Prior to Admission medications   Medication Sig Start Date End Date Taking? Authorizing Provider  acetaminophen (TYLENOL) 325 MG tablet Take 2 tablets (650 mg total) by mouth every 6 (six) hours as needed. 08/14/18   Hedges, Tinnie Gens, PA-C  amoxicillin (AMOXIL) 500 MG capsule Take 1 capsule (500 mg total) by mouth 3 (three) times daily. 06/26/18   Antony Madura, PA-C  ibuprofen (ADVIL,MOTRIN) 600 MG tablet Take 1 tablet (600 mg total) by mouth every 8 (eight) hours as needed for moderate pain. 06/06/18   Shaune Pollack, MD  meloxicam (MOBIC) 15 MG tablet Take 1 tablet (15 mg total) by mouth daily. 07/26/18   Arthor Captain, PA-C  omeprazole (PRILOSEC) 20 MG capsule Take 1 capsule (20 mg total) by mouth daily. 06/24/18   Roxy Horseman, PA-C  penicillin v potassium (VEETID) 500 MG tablet Take 2 tablets (1,000 mg total) by mouth 2 (two) times daily. X 7 days 10/24/18   Melene Plan, DO   permethrin (ELIMITE) 5 % cream Apply to affected area once 08/15/18   Ward, Layla Maw, DO  triamcinolone ointment (KENALOG) 0.5 % Apply 1 application topically 2 (two) times daily. 12/14/18   McDonald, Mia A, PA-C    Family History Family History  Problem Relation Age of Onset  . Diabetes Mother   . Hypertension Mother   . Cancer Mother   . Cancer Father   . Diabetes Father   . Hypertension Father     Social History Social History   Tobacco Use  . Smoking status: Current Every Day Smoker    Packs/day: 1.00    Years: 10.00    Pack years: 10.00    Types: Cigarettes  . Smokeless tobacco: Never Used  Substance Use Topics  . Alcohol use: No    Frequency: Never  . Drug use: Yes    Types: Marijuana, Methamphetamines    Comment: Herion     Allergies   Patient has no known allergies.   Review of Systems Review of Systems  Skin: Positive for rash.   Ten systems reviewed and are negative for acute change, except as noted in the HPI.    Physical Exam Updated Vital Signs Ht 6\' 1"  (1.854 m)   Wt 77.1 kg   BMI 22.43 kg/m   Physical Exam Vitals signs and nursing note reviewed.  Constitutional:  General: He is not in acute distress.    Appearance: He is well-developed. He is not diaphoretic.     Comments: Nontoxic appearing and in NAD  HENT:     Head: Normocephalic and atraumatic.  Eyes:     General: No scleral icterus.    Conjunctiva/sclera: Conjunctivae normal.  Neck:     Musculoskeletal: Normal range of motion.  Pulmonary:     Effort: Pulmonary effort is normal. No respiratory distress.     Comments: Respirations even and unlabored Musculoskeletal: Normal range of motion.  Skin:    General: Skin is warm and dry.     Coloration: Skin is not pale.     Findings: Rash present. No erythema.     Comments: Two erythematous macules noted to the abdomen. Slightly raised, annular, blanching. Dryness to central skin. No purulence, weeping, induration.   Neurological:     General: No focal deficit present.     Mental Status: He is alert and oriented to person, place, and time.     Coordination: Coordination normal.  Psychiatric:        Behavior: Behavior normal.    Images taken from 12/14/18:     ED Treatments / Results  Labs (all labs ordered are listed, but only abnormal results are displayed) Labs Reviewed - No data to display  EKG None  Radiology No results found.  Procedures Procedures (including critical care time)  Medications Ordered in ED Medications  clotrimazole-betamethasone (LOTRISONE) cream (has no administration in time range)     Initial Impression / Assessment and Plan / ED Course  I have reviewed the triage vital signs and the nursing notes.  Pertinent labs & imaging results that were available during my care of the patient were reviewed by me and considered in my medical decision making (see chart for details).        33 year old male presents to the emergency department for persistent rash to his abdomen.  His physical exam appears consistent with tinea corporis.  He was seen for this 3 days ago, but placed on triamcinolone - possibly for presumption of nummular eczema (?).  He subjectively feels this has been making his rash worse, though it appears largely unchanged compared to imaging taken during his last ED visit.  There is no evidence of secondary infection or cellulitis.  He will be changed from Kenalog to Lotrisone for management.  Encouraged follow-up with a primary care doctor.  Patient discharged in stable condition with no unaddressed concerns.   Final Clinical Impressions(s) / ED Diagnoses   Final diagnoses:  Tinea corporis    ED Discharge Orders    None       Antony MaduraHumes, Aubery Date, Cordelia Poche-C 12/17/18 2229    Gerhard MunchLockwood, Robert, MD 12/19/18 2306

## 2018-12-17 NOTE — ED Triage Notes (Signed)
Pt has 2 areas  Of concern 1 just below belly button and 1 area located suprapubic area. Both sites are round and red . Pt denies any drainage but does report itching.

## 2018-12-17 NOTE — Discharge Instructions (Signed)
Use Lotrisone cream twice a day until rash resolves. Follow up with a primary care doctor.

## 2018-12-17 NOTE — ED Notes (Signed)
Patient verbalizes understanding of discharge instructions . Opportunity for questions and answers were provided . Armband removed by staff ,Pt discharged from ED. W/C  offered at D/C  and Declined W/C at D/C and was escorted to lobby by RN.  

## 2018-12-17 NOTE — ED Triage Notes (Signed)
Pt arrives POV for eval of rash to abd and groin. Pt reports he was seen here for same on 5/29. States given cream but it did not help.

## 2019-01-11 ENCOUNTER — Emergency Department (HOSPITAL_COMMUNITY)
Admission: EM | Admit: 2019-01-11 | Discharge: 2019-01-11 | Disposition: A | Discharge status: Home / Self Care | Payer: Self-pay | Attending: Emergency Medicine | Admitting: Emergency Medicine

## 2019-01-11 ENCOUNTER — Other Ambulatory Visit: Payer: Self-pay

## 2019-01-11 ENCOUNTER — Encounter (HOSPITAL_COMMUNITY): Payer: Self-pay | Admitting: Emergency Medicine

## 2019-01-11 DIAGNOSIS — R21 Rash and other nonspecific skin eruption: Secondary | ICD-10-CM | POA: Insufficient documentation

## 2019-01-11 DIAGNOSIS — Z79899 Other long term (current) drug therapy: Secondary | ICD-10-CM | POA: Insufficient documentation

## 2019-01-11 DIAGNOSIS — F17210 Nicotine dependence, cigarettes, uncomplicated: Secondary | ICD-10-CM | POA: Insufficient documentation

## 2019-01-11 MED ORDER — KETOCONAZOLE 2 % EX CREA: 1.0000 "application " | TOPICAL_CREAM | Freq: Every day | CUTANEOUS | 0 refills | Status: DC

## 2019-01-11 NOTE — Care Management (Signed)
ED CM was contact concerning medication assistance, patient is homeless and was assisted recently with [facility] program. Patient was instructed to  Follow up with  the [facility] for medication assistance. CM will send a message to [facility] concerning patient.

## 2019-01-11 NOTE — ED Provider Notes (Signed)
[facility]   CSN: [id number] Arrival date & time: 01/11/19  2202    History   Chief Complaint Chief Complaint  Patient presents with  . Rash    HPI [name] is a 33 y.o. male with a history of methamphetamine use and homelessness who presents to the emergency department with a chief complaint of rash.  The patient endorses a rash to the lower abdomen that is been present for at least 6 weeks.  He reports there to discrete circular areas.  They have not enlarged.  He reports they are intermittently itchy, but the areas are not painful, they have not been draining, and they are not warm to the touch.  He reports that he has been seen in the ER multiple times for the same and was given 2 different creams.  He has used the entire tubes of both creams with minimal improvement.  He was given a referral to primary care for follow-up, but has not followed up.  He reports that the area is not bothersome, but "I don't like the way it looks."  He reports the area started to look slightly more raised after he shaved a couple of days ago.  No fever, chills, red streaking.  He also reports that he noticed a rash to his left dorsum of the left third digit.  Reports that the area initially presented 2 weeks ago as several grouped small bumps.  He reports that he has been scratching at the area and there is been a small amount of clear drainage.  No history of similar bumps or rash to the groin, other fingers, or other areas of the body.  No red streaking purulent drainage.     The history is provided by the patient. No language interpreter was used.    History reviewed. No pertinent past medical history.  There are no active problems to display for this patient.   History reviewed. No pertinent surgical history.      Home Medications    Prior to Admission medications   Medication Sig Start Date End Date Taking? Authorizing Provider   acetaminophen (TYLENOL) 325 MG tablet Take 2 tablets (650 mg total) by mouth every 6 (six) hours as needed. 08/14/18   Hedges, Tinnie GensJeffrey, PA-C  amoxicillin (AMOXIL) 500 MG capsule Take 1 capsule (500 mg total) by mouth 3 (three) times daily. 06/26/18   Antony MaduraHumes, Kelly, PA-C  clotrimazole-betamethasone (LOTRISONE) cream Apply to affected area 2 times daily prn 12/17/18   Antony MaduraHumes, Kelly, PA-C  ibuprofen (ADVIL,MOTRIN) 600 MG tablet Take 1 tablet (600 mg total) by mouth every 8 (eight) hours as needed for moderate pain. 06/06/18   Shaune PollackIsaacs, Cameron, MD  ketoconazole (NIZORAL) 2 % cream Apply 1 application topically daily. 01/11/19   Park Beck A, PA-C  meloxicam (MOBIC) 15 MG tablet Take 1 tablet (15 mg total) by mouth daily. 07/26/18   Arthor CaptainHarris, Abigail, PA-C  omeprazole (PRILOSEC) 20 MG capsule Take 1 capsule (20 mg total) by mouth daily. 06/24/18   Roxy HorsemanBrowning, Robert, PA-C  penicillin v potassium (VEETID) 500 MG tablet Take 2 tablets (1,000 mg total) by mouth 2 (two) times daily. X 7 days 10/24/18   Melene PlanFloyd, Dan, DO  permethrin (ELIMITE) 5 % cream Apply to affected area once 08/15/18   Ward, Layla MawKristen N, DO  triamcinolone ointment (KENALOG) 0.5 % Apply 1 application topically 2 (two) times daily. 12/14/18   Doctor Sheahan, Coral ElseMia A, PA-C    Family History Family History  Problem Relation Age of Onset  . Diabetes Mother   . Hypertension Mother   . Cancer Mother   . Cancer Father   . Diabetes Father   . Hypertension Father     Social History Social History   Tobacco Use  . Smoking status: Current Every Day Smoker    Packs/day: 1.00    Years: 10.00    Pack years: 10.00    Types: Cigarettes  . Smokeless tobacco: Never Used  Substance Use Topics  . Alcohol use: No    Frequency: Never  . Drug use: Yes    Types: Marijuana, Methamphetamines    Comment: Herion     Allergies   Patient has no known allergies.   Review of Systems Review of Systems  Constitutional: Negative for activity change, chills and fever.   Cardiovascular: Negative for chest pain.  Musculoskeletal: Negative for back pain.  Skin: Positive for color change and rash. Negative for wound.     Physical Exam Updated Vital Signs BP (!) 142/98 (BP Location: Right Arm)   Pulse (!) 107   Temp 98.3 F (36.8 C) (Oral)   Resp 18   SpO2 100%   Physical Exam Constitutional:      Appearance: He is well-developed.  HENT:     Head: Normocephalic and atraumatic.  Neck:     Musculoskeletal: Neck supple.  Pulmonary:     Effort: Pulmonary effort is normal.  Skin:    Comments: There are 2 circular erythematous raised lesions noted to the lower abdomen.  There is minimal scaling to the lesions.  Minimally changed from previous exam almost 4 weeks ago however, it appears that the patient shaved over the area of the rash and some minor skin irritation is now present.  No obvious folliculitis.  No purulent drainage.  No satellite lesions, disclamation, vesicles, or bulla.  The more superior lesion is more nodular, but I also suspect that this is from shaving.  There is no abscess.  On the dorsum of the left third finger, several grouped vesicles with serous drainage are present.   Neurological:     General: No focal deficit present.     Mental Status: He is alert.     Cranial Nerves: No cranial nerve deficit.  Psychiatric:        Behavior: Behavior normal.          ED Treatments / Results  Labs (all labs ordered are listed, but only abnormal results are displayed) Labs Reviewed  RPR  HIV ANTIBODY (ROUTINE TESTING W REFLEX)    EKG None  Radiology No results found.  Procedures Procedures (including critical care time)  Medications Ordered in ED Medications - No data to display   Initial Impression / Assessment and Plan / ED Course  I have reviewed the triage vital signs and the nursing notes.  Pertinent labs & imaging results that were available during my care of the patient were reviewed by me and considered in my  medical decision making (see chart for details).        33 year old male with history of homelessness and methamphetamine use presenting with rash to the lower abdomen and left third finger.  The rash on the abdomen has been present for approximately 6 weeks with minimal change.  Of note, the patient has been seen and evaluated in the ER 12 times in the last 6 months, including several visits for abdominal rash.  I personally evaluated the patient approximately 1 month ago for the  same.  He has used an entire tube of triamcinolone ointment and Lotrisone cream with minimal change to the area.  He reports that the rash is not bothersome and is intermittently, minimally itchy.  He mostly just wants it to go away.  He has not followed up with primary care.  There appears to be some new irritation to the area as the patient recently shaved over the last few days.  However, I do not see any cellulitic changes or abscess.  Antibiotics not indicated at this time.  Given that the rash appeared somewhat scaling about a month ago prior to the patient shaving the area, will discharge with a course of ketoconazole cream and outpatient follow-up with [facility] at the [facility].  Patient expressed concerns about not being able to afford the prescription.  I spoke with Mariann Laster at social work who reached out to [facility].  I discussed at length with the patient that he can follow-up with Haynes Dage to help him get his prescription.  Regarding the rash on his left hand, it is concerning for either herpetic whitlow versus viral HPV lesion with non-genital warts.  Patient has no history of HSV or previous outbreaks.  I think it is reasonable to also check the patient for HIV and RPR given the unchanged rash on his abdomen in addition to the new rash on his left finger.  No angioedema. No blisters, no pustules, no warmth, no draining sinus tracts, no superficial abscesses, no bullous impetigo, no vesicles, no desquamation, no target  lesions with dusky purpura or a central bulla. Not tender to touch. No concern for superimposed infection. No concern for SJS, TEN, TSS, tick borne illness, syphilis or other life-threatening condition.  Safe for discharge with outpatient follow-up.  Final Clinical Impressions(s) / ED Diagnoses   Final diagnoses:  Rash and nonspecific skin eruption    ED Discharge Orders         Ordered    ketoconazole (NIZORAL) 2 % cream  Daily     01/11/19 2247           Joline Maxcy A, PA-C 01/11/19 2358    Varney Biles, MD 01/12/19 1228

## 2019-01-11 NOTE — ED Triage Notes (Signed)
Patient reports suprapubic skin redness with drainage for several days and  mild itching.

## 2019-01-11 NOTE — Discharge Instructions (Signed)
Thank you for allowing me to care for you today in the Emergency Department.   You need to follow up with Audrea Muscat. Her contact information is listed above. Social work reached out to her for you, and she is expecting a call/visit from you.  Unfortunately, we do not have any samples of the prescription that I have written for you.  If you are unable to afford the prescription, Audrea Muscat can assist you with this.   There is very important that you do not shave the area where the rash is until it resolves.  This can cause it to get infected.  There are 2 pieces of blood work are pending.  If your HIV or syphilis test is positive, someone from the hospital will call you.  You can apply the ketoconazole ointment to the rash on your abdomen.  Do not put it on the area on your finger.  The area on your finger looks more consistent with a viral rash.  This can go away with time.  It is important not to scratch or pick at the rash as this can cause it to get infected.  Sometimes if the rash does not go away on its own there are other treatment options.  You can follow-up with Audrea Muscat for this.  Return to the emergency department if you develop a rash around your penis, if you start having thick, mucus-like drainage from the rash, if you have high fevers, if your skin starts peeling off around the rash, or if you develop other new, concerning symptoms.

## 2019-01-15 LAB — RPR, QUANT+TP ABS (REFLEX)
Rapid Plasma Reagin, Quant: 1:128 {titer} — ABNORMAL HIGH
T Pallidum Abs: REACTIVE — AB

## 2019-01-15 LAB — RPR: RPR Ser Ql: REACTIVE — AB

## 2019-01-15 LAB — HIV ANTIBODY (ROUTINE TESTING W REFLEX): HIV Screen 4th Generation wRfx: REACTIVE — AB

## 2019-01-15 LAB — HIV 1/2 AB DIFFERENTIATION
HIV 1 Ab: POSITIVE — AB
HIV 2 Ab: NEGATIVE

## 2019-01-16 ENCOUNTER — Telehealth: Payer: Self-pay | Admitting: *Deleted

## 2019-01-16 ENCOUNTER — Telehealth: Payer: Self-pay | Admitting: Infectious Disease

## 2019-01-16 NOTE — Telephone Encounter (Signed)
Information sent to [facility] to follow up and connection to care.

## 2019-01-16 NOTE — Telephone Encounter (Signed)
[name] can we get [facility] to get this guy soon as possible and get him in care he is seroconverted in the last 7 months has a high titer for syphilis and I suspect may have a high viral load  He was seen in the ER where he was found to have syphilis and HIV though I think the test came back later

## 2019-02-13 ENCOUNTER — Encounter (HOSPITAL_COMMUNITY): Payer: Self-pay

## 2019-02-13 ENCOUNTER — Emergency Department (HOSPITAL_COMMUNITY)
Admission: EM | Admit: 2019-02-13 | Discharge: 2019-02-14 | Disposition: A | Discharge status: Home / Self Care | Payer: Self-pay | Attending: Emergency Medicine | Admitting: Emergency Medicine

## 2019-02-13 ENCOUNTER — Other Ambulatory Visit: Payer: Self-pay

## 2019-02-13 DIAGNOSIS — B370 Candidal stomatitis: Secondary | ICD-10-CM | POA: Insufficient documentation

## 2019-02-13 DIAGNOSIS — A539 Syphilis, unspecified: Secondary | ICD-10-CM | POA: Insufficient documentation

## 2019-02-13 DIAGNOSIS — F17210 Nicotine dependence, cigarettes, uncomplicated: Secondary | ICD-10-CM | POA: Insufficient documentation

## 2019-02-13 DIAGNOSIS — Z590 Homelessness: Secondary | ICD-10-CM | POA: Insufficient documentation

## 2019-02-13 DIAGNOSIS — B20 Human immunodeficiency virus [HIV] disease: Secondary | ICD-10-CM | POA: Insufficient documentation

## 2019-02-13 HISTORY — DX: Other psychoactive substance abuse, uncomplicated: F19.10

## 2019-02-13 HISTORY — DX: Attention-deficit hyperactivity disorder, unspecified type: F90.9

## 2019-02-13 LAB — CBC WITH DIFFERENTIAL/PLATELET
Abs Immature Granulocytes: 0.01 10*3/uL (ref 0.00–0.07)
Basophils Absolute: 0.1 10*3/uL (ref 0.0–0.1)
Basophils Relative: 1 %
Eosinophils Absolute: 0.2 10*3/uL (ref 0.0–0.5)
Eosinophils Relative: 4 %
HCT: 39.3 % (ref 39.0–52.0)
Hemoglobin: 13.1 g/dL (ref 13.0–17.0)
Immature Granulocytes: 0 %
Lymphocytes Relative: 40 %
Lymphs Abs: 2.4 10*3/uL (ref 0.7–4.0)
MCH: 28.9 pg (ref 26.0–34.0)
MCHC: 33.3 g/dL (ref 30.0–36.0)
MCV: 86.8 fL (ref 80.0–100.0)
Monocytes Absolute: 0.5 10*3/uL (ref 0.1–1.0)
Monocytes Relative: 9 %
Neutro Abs: 2.8 10*3/uL (ref 1.7–7.7)
Neutrophils Relative %: 46 %
Platelets: 292 10*3/uL (ref 150–400)
RBC: 4.53 MIL/uL (ref 4.22–5.81)
RDW: 15.1 % (ref 11.5–15.5)
WBC: 6 10*3/uL (ref 4.0–10.5)
nRBC: 0 % (ref 0.0–0.2)

## 2019-02-13 NOTE — ED Triage Notes (Signed)
Patient came in POV with c/o of white spots on tongue x3 weeks. Per patient, he does use meth (last use yesterday) and has cavities.

## 2019-02-14 LAB — CD4/CD8 (T-HELPER/T-SUPPRESSOR CELL)
CD4 absolute: 588 /uL (ref 400–1790)
CD4%: 25 % — ABNORMAL LOW (ref 33–65)
CD8 T Cell Abs: 1359 /uL — ABNORMAL HIGH (ref 190–1000)
CD8tox: 59 % — ABNORMAL HIGH (ref 12–40)
Ratio: 0.43 — ABNORMAL LOW (ref 1.0–3.0)
Total lymphocyte count: 2316 /uL (ref 1000–4000)

## 2019-02-14 LAB — COMPREHENSIVE METABOLIC PANEL
ALT: 35 U/L (ref 0–44)
AST: 40 U/L (ref 15–41)
Albumin: 3.6 g/dL (ref 3.5–5.0)
Alkaline Phosphatase: 70 U/L (ref 38–126)
Anion gap: 9 (ref 5–15)
BUN: 8 mg/dL (ref 6–20)
CO2: 28 mmol/L (ref 22–32)
Calcium: 9.4 mg/dL (ref 8.9–10.3)
Chloride: 102 mmol/L (ref 98–111)
Creatinine, Ser: 0.87 mg/dL (ref 0.61–1.24)
GFR calc Af Amer: >60 mL/min (ref >60)
GFR calc non Af Amer: >60 mL/min (ref >60)
Glucose, Bld: 95 mg/dL (ref 70–99)
Potassium: 3.4 mmol/L — ABNORMAL LOW (ref 3.5–5.1)
Sodium: 139 mmol/L (ref 135–145)
Total Bilirubin: 0.6 mg/dL (ref 0.3–1.2)
Total Protein: 8.1 g/dL (ref 6.5–8.1)

## 2019-02-14 LAB — LACTIC ACID, PLASMA: Lactic Acid, Venous: 2.2 mmol/L (ref 0.5–1.9)

## 2019-02-14 MED ORDER — LIDOCAINE HCL (PF) 1 % IJ SOLN
INTRAMUSCULAR | Status: AC
  Filled 2019-02-14: qty 5

## 2019-02-14 MED ORDER — AZITHROMYCIN 250 MG PO TABS
1000.0000 mg | ORAL_TABLET | Freq: Once | ORAL | Status: AC
  Administered 2019-02-14: 1000 mg via ORAL
  Filled 2019-02-14: qty 4

## 2019-02-14 MED ORDER — CEFTRIAXONE SODIUM 250 MG IJ SOLR
250.0000 mg | Freq: Once | INTRAMUSCULAR | Status: DC
  Filled 2019-02-14: qty 250

## 2019-02-14 MED ORDER — SODIUM CHLORIDE 0.9 % IV BOLUS: 1000.0000 mL | Freq: Once | INTRAVENOUS | Status: DC

## 2019-02-14 MED ORDER — PENICILLIN G BENZATHINE 1200000 UNIT/2ML IM SUSP
2.4000 10*6.[IU] | Freq: Once | INTRAMUSCULAR | Status: DC
  Filled 2019-02-14: qty 4

## 2019-02-14 NOTE — ED Provider Notes (Addendum)
[name] Provider Note   CSN: [id number] Arrival date & time: 02/13/19  2252    History   Chief Complaint Chief Complaint  Patient presents with  . Mouth Lesions    white spots on tongue    HPI [name] is a 33 y.o. male history of methamphetamine and cannabis use disorder who presents to the emergency department with a chief complaint of tongue lesions.  History is limited as the patient continues to fall asleep during interview.  He reports that he has had some white lesions on the dorsum of the tongue for the last few weeks.  The patient is well-known to this emergency department with 10 visits in the last 6 months.  He was previously seen for a rash to the pelvic and lower abdomen that has been present for several months.  He reports that rash is still present, but is more scaling and improved from previous.  He reports that he has passed out 3 times over the last 2 months, last episode was 2 weeks ago.  He reports this occurred after being outside in the heat for several hours.  He denies fever, chills, chest pain, shortness of breath, abdominal pain, nausea, vomiting, diarrhea, dysphagia, confusion, headaches, dizziness, numbness, or weakness.     The history is provided by the patient. No language interpreter was used.    Past Medical History:  Diagnosis Date  . ADHD (attention deficit hyperactivity disorder)   . Drug abuse (HCC)    meth, marijuana    There are no active problems to display for this patient.   History reviewed. No pertinent surgical history.      Home Medications    Prior to Admission medications   Medication Sig Start Date End Date Taking? Authorizing Provider  omeprazole (PRILOSEC) 20 MG capsule Take 1 capsule (20 mg total) by mouth daily. Patient not taking: Reported on 02/14/2019 06/24/18 02/14/19  Roxy HorsemanBrowning, Robert, PA-C    Family History Family History  Problem Relation Age of Onset  .  Diabetes Mother   . Hypertension Mother   . Cancer Mother   . Cancer Father   . Diabetes Father   . Hypertension Father     Social History Social History   Tobacco Use  . Smoking status: Current Every Day Smoker    Packs/day: 1.00    Years: 10.00    Pack years: 10.00    Types: Cigarettes  . Smokeless tobacco: Never Used  Substance Use Topics  . Alcohol use: No    Frequency: Never  . Drug use: Yes    Types: Marijuana, Methamphetamines    Comment: heroine      Allergies   Patient has no known allergies.   Review of Systems Review of Systems  Constitutional: Negative for appetite change, chills and fever.  HENT: Positive for mouth sores. Negative for congestion, ear pain, facial swelling and sore throat.   Eyes: Negative for visual disturbance.  Respiratory: Negative for shortness of breath.   Cardiovascular: Negative for chest pain.  Gastrointestinal: Negative for abdominal pain, diarrhea, nausea and vomiting.  Genitourinary: Negative for dysuria, frequency, hematuria, penile pain and urgency.  Musculoskeletal: Negative for back pain.  Skin: Positive for rash.  Allergic/Immunologic: Negative for immunocompromised state.  Neurological: Positive for syncope. Negative for weakness and headaches.  Psychiatric/Behavioral: Negative for confusion.   Physical Exam Updated Vital Signs BP 113/71 (BP Location: Right Arm)   Pulse (!) 101   Temp 98.4 F (36.9  C) (Oral)   Resp 16   Ht 6\' 1"  (1.854 m)   Wt 79.4 kg   SpO2 98%   BMI 23.09 kg/m   Physical Exam Vitals signs and nursing note reviewed.  Constitutional:      General: He is not in acute distress.    Appearance: He is well-developed. He is not ill-appearing, toxic-appearing or diaphoretic.  HENT:     Head: Normocephalic.  Eyes:     Extraocular Movements: Extraocular movements intact.     Conjunctiva/sclera: Conjunctivae normal.     Pupils: Pupils are equal, round, and reactive to light.  Neck:      Musculoskeletal: Neck supple.  Cardiovascular:     Rate and Rhythm: Normal rate and regular rhythm.     Pulses: Normal pulses.     Heart sounds: Normal heart sounds. No murmur. No friction rub. No gallop.   Pulmonary:     Effort: Pulmonary effort is normal. No respiratory distress.     Breath sounds: Normal breath sounds. No stridor. No wheezing, rhonchi or rales.  Chest:     Chest wall: No tenderness.  Abdominal:     General: There is no distension.     Palpations: Abdomen is soft. There is no mass.     Tenderness: There is no abdominal tenderness. There is no right CVA tenderness, left CVA tenderness, guarding or rebound.     Hernia: No hernia is present.  Skin:    General: Skin is warm and dry.     Capillary Refill: Capillary refill takes less than 2 seconds.     Comments: Scaling circular lesions noted to the mid lower abdomen and mid pelvic region.  No warmth, red streaking, or edema.  He has a scaling rash noted to the dorsum of the left index finger with no vesicles, erythema, edema, red streaking.  Neurological:     Mental Status: He is alert.     Comments: Sleeping, but rouses easily to voice.  Psychiatric:        Mood and Affect: Affect is angry.        Behavior: Behavior is uncooperative.    Pelvic region   Lower midline abdomen   Dorsum of tongue   Left index finger      ED Treatments / Results  Labs (all labs ordered are listed, but only abnormal results are displayed) Labs Reviewed  LACTIC ACID, PLASMA - Abnormal; Notable for the following components:      Result Value   Lactic Acid, Venous 2.2 (*)    All other components within normal limits  COMPREHENSIVE METABOLIC PANEL - Abnormal; Notable for the following components:   Potassium 3.4 (*)    All other components within normal limits  CBC WITH DIFFERENTIAL/PLATELET  URINALYSIS, ROUTINE W REFLEX MICROSCOPIC  CD4/CD8 (T-HELPER/T-SUPPRESSOR CELL)  HIV-1 RNA, PCR (GRAPH) RFX/GENO EDI  GC/CHLAMYDIA  PROBE AMP ([place]) NOT [facility]    EKG None  Radiology No results found.  Procedures Procedures (including critical care time)  Medications Ordered in ED Medications  penicillin g benzathine (BICILLIN LA) 1200000 UNIT/2ML injection 2.4 Million Units (has no administration in time range)  cefTRIAXone (ROCEPHIN) injection 250 mg (has no administration in time range)  azithromycin (ZITHROMAX) tablet 1,000 mg (has no administration in time range)     Initial Impression / Assessment and Plan / ED Course  I have reviewed the triage vital signs and the nursing notes.  Pertinent labs & imaging results that were available during my  care of the patient were reviewed by me and considered in my medical decision making (see chart for details).        [age] year old male with a history of homelessness, methamphetamine abuse, cannabis abuse and frequent ER visits.  He has had 10 ER visits in the last 6, and I have personally cared for this patient on 2 previous occasions.  He is here for white lesions to the dorsum of the tongue that do not scrape off that it been present for the last few weeks.  No other complaints at this time.  Of note, during patient's frequent ER visits he is typically here to sleep and eat given his history of homelessness.  - Spoke with Dr. Ninetta LightsHatcher after Dr. Algis LimingVandam attempted to contact the patient unsuccessfully after he tested positive for syphilis and HIV during his last ER visit in June.  He is requesting a CD4 count, HIV RNA, and recommends checking the patient for gonorrhea and chlamydia.  These have been ordered.  He is also requesting a good contact number for the patient.  -The patient reports that he does not have a good contact number.  When asked the best way to get in contact with him he states "I don't know. People just find me. I don't have a phone."  Regarding positive HIV test patient states "I don't believe it. I don't sleep with people like that. You  should recheck it."  Patient also continues to endorse that he has multiple sexual partners and refuses to use condoms.  We discussed the legal ramifications of knowingly having an infectious, transmittable disease without disclosing it to sexual partners.  I also discussed that he should let all sexual partners know that he tested positive for syphilis.  He does report that he has had 3 syncopal episodes over the last 2 months, the last episode was 2 weeks ago and occurred after being out in the heat index of over 100 for several hours as he suffers from homelessness.  Neurologically intact on exam without focal deficit.  Labs are notable for a lactate of 2.2, but otherwise unremarkable.  He had an oral temp of 100.2 on arrival, but improved to 98.4 without treatment.  He has had no tachycardia and is normotensive.   -07:30 AM notified by nursing staff that patient was refusing all medications.  I went to reevaluate the patient and explained that the patient tested positive for syphilis and actively had a syphilis infection.  Also discussed that given this concern that he should also be treated for gonorrhea and chlamydia as these tests were pending.  The patient states "Rhodia AlbrightUghhh, I don't want a shot this morning."  I discussed with the patient that if he does not take this medication that he will still actively have this infection.  I also discussed with the patient that he can follow-up with the infectious disease clinic and receive medications for free.  I recommended that he follow-up with infectious disease clinic for medication for oral candidiasis as he has previously received a prescription from a social work, and when I cared for him last time he is unable to receive another prescription for free.  The patient states " it is unlikely that I will follow-up there.  It sounds like the kind of place where they lock you up in a room.  I do not work.  I am just going to come back later when I feel like it to  get these shots. I don't are  if it is free there because everything you do for me is free too."  He is hemodynamically stable and in no acute distress.  He is refusing all further treatment at this time, but labs requested per Dr. Johnnye Sima have been sent.  Return precautions to the ER given.  Final Clinical Impressions(s) / ED Diagnoses   Final diagnoses:  HIV infection, unspecified symptom status ([place])  Syphilis  Oral candidiasis    ED Discharge Orders    None       Joanne Gavel, PA-C 02/14/19 0749    Joanne Gavel, PA-C 02/14/19 0811    Ward, Delice Bison, DO 02/14/19 2320

## 2019-02-14 NOTE — ED Notes (Signed)
attempted to collect urine sample; pt asleep; no sample provided at this time; [place], [place], notified

## 2019-02-14 NOTE — ED Notes (Signed)
Pt reminded that a urine sample is needed; pt verbalized understanding 

## 2019-02-14 NOTE — Discharge Instructions (Addendum)
Thank you for allowing me to care for you today in the Emergency Department.   I strongly encourage you to follow-up with the infectious disease clinic.  They can order blood work and get medication for you for free.  Please follow-up with the infectious disease clinic in 1 week because you need 3 doses of penicillin to treat syphilis.  You received the first dose in the ER today.  You should also follow-up regarding your positive HIV result.  Untreated HIV can make you very susceptible to lots of infections.  You also tested positive for syphilis, until you have been treated with all 3 doses, you should disclose to all sexual partners that you tested positive.  Your test for gonorrhea and Chlamydia are pending today.  Regarding the spots on your tongue, you can follow-up with the infectious disease clinic to obtain medication for this problem.   Return to the emergency department for new or worsening symptoms including fever, chills, confusion, new numbness or weakness, persistent vomiting, or if you become unable to swallow.  As we discussed, it is a criminal offense for you to intentionally transmit the virus to a sexual partner without informing them of your positive HIV status.   [place]  [phone] Misdemeanor (imprisonment for not more than 2 years) All persons infected with HIV must comply with control measures for communicable diseases specified in the state administrative code, [facility] 41A.0202(1) . That section a) prohibits sexual intercourse without the use of condoms and requires the exercise of caution when using condoms due to possible condom failure; b) prohibits the sharing of needles, syringes, or any other drug-related equipment, paraphernalia, or works that may be contaminated with blood through previous use; c) prohibits the donation or sale of blood, plasma, platelets, other blood products, semen, ova, tissues, organs, or breast milk; d) requires a skin test for  tuberculosis; e) requires the notification of future sexual intercourse partners of the infection; f) if the date of initial infection is known, requires the notification of persons who have been sexual intercourse and needle partners since the date of the infection; and, g) if the date of initial infection is unknown, requires the notification of persons who have been sexual intercourse and needle partners for the previous year. Violation of the control measures is a misdemeanor and special provisions related to sentencing and release apply (see [place]  003B-04).

## 2019-02-14 NOTE — ED Notes (Signed)
Pt refusing IM antibiotics; Dr. Leonides Schanz and Maree [place], [place], aware

## 2019-02-24 ENCOUNTER — Other Ambulatory Visit: Payer: Self-pay

## 2019-02-24 ENCOUNTER — Encounter (HOSPITAL_COMMUNITY): Payer: Self-pay | Admitting: Emergency Medicine

## 2019-02-24 ENCOUNTER — Emergency Department (HOSPITAL_COMMUNITY)
Admission: EM | Admit: 2019-02-24 | Discharge: 2019-02-24 | Disposition: A | Discharge status: Home / Self Care | Payer: Self-pay | Attending: Emergency Medicine | Admitting: Emergency Medicine

## 2019-02-24 DIAGNOSIS — A539 Syphilis, unspecified: Secondary | ICD-10-CM | POA: Insufficient documentation

## 2019-02-24 DIAGNOSIS — B370 Candidal stomatitis: Secondary | ICD-10-CM | POA: Insufficient documentation

## 2019-02-24 DIAGNOSIS — F17210 Nicotine dependence, cigarettes, uncomplicated: Secondary | ICD-10-CM | POA: Insufficient documentation

## 2019-02-24 DIAGNOSIS — B20 Human immunodeficiency virus [HIV] disease: Secondary | ICD-10-CM | POA: Insufficient documentation

## 2019-02-24 DIAGNOSIS — R21 Rash and other nonspecific skin eruption: Secondary | ICD-10-CM | POA: Insufficient documentation

## 2019-02-24 DIAGNOSIS — F909 Attention-deficit hyperactivity disorder, unspecified type: Secondary | ICD-10-CM | POA: Insufficient documentation

## 2019-02-24 MED ORDER — CEFTRIAXONE SODIUM 250 MG IJ SOLR
250.0000 mg | Freq: Once | INTRAMUSCULAR | Status: AC
  Administered 2019-02-24: 250 mg via INTRAMUSCULAR
  Filled 2019-02-24: qty 250

## 2019-02-24 MED ORDER — CLOTRIMAZOLE 10 MG MT TROC: 10.0000 mg | Freq: Every day | OROMUCOSAL | 0 refills | Status: AC

## 2019-02-24 MED ORDER — AZITHROMYCIN 250 MG PO TABS
1000.0000 mg | ORAL_TABLET | Freq: Once | ORAL | Status: AC
  Administered 2019-02-24: 1000 mg via ORAL
  Filled 2019-02-24: qty 4

## 2019-02-24 MED ORDER — PENICILLIN G BENZATHINE 1200000 UNIT/2ML IM SUSP
2.4000 10*6.[IU] | Freq: Once | INTRAMUSCULAR | Status: AC
  Administered 2019-02-24: 2.4 10*6.[IU] via INTRAMUSCULAR
  Filled 2019-02-24: qty 4

## 2019-02-24 MED ORDER — LIDOCAINE HCL (PF) 1 % IJ SOLN
INTRAMUSCULAR | Status: AC
  Administered 2019-02-24: 5 mL
  Filled 2019-02-24: qty 5

## 2019-02-24 NOTE — ED Provider Notes (Signed)
[facility] Provider [facility]   CSN: [id number] Arrival date & time: 02/24/19  1157     History   Chief Complaint Chief Complaint  Patient presents with  . Z11.3    HPI [name] is a 33 y.o. male.     [name] is a 33 y.o. male with a history of ADHD, drug abuse, who presents to the emergency department for STD treatment.  Patient was seen in the ED on 7/29, at this time he was recommended to be treated for syphilis given recent positive RPR, patient declined any medications at this time.  Patient has recently tested positive for HIV, but has not yet followed up with the infectious disease clinic, during last ED visit HIV labs completed per Dr. Ninetta LightsHatcher with IDs recommendation and patient was also supposed to be treated and tested for gonorrhea and chlamydia, this test was not processed and patient did not receive treatment, he would like to be treated for this as well.  Patient has been seen multiple times rash over his pelvic and abdominal region which he reports is improving.  He also is complaining of some white lesions on his tongue that it is been present over the last few weeks, he was previously prescribed treatment for oral candidiasis, but has not filled or taken any of these medications.  Reports today his main concern is getting the shots and medications he was supposed to get last time but left prior to receiving.     Past Medical History:  Diagnosis Date  . ADHD (attention deficit hyperactivity disorder)   . Drug abuse (HCC)    meth, marijuana    There are no active problems to display for this patient.   History reviewed. No pertinent surgical history.      Home Medications    Prior to Admission medications   Medication Sig Start Date End Date Taking? Authorizing Provider  clotrimazole (MYCELEX) 10 MG troche Take 1 tablet (10 mg total) by mouth 5 (five) times daily for 7 days. 02/24/19 03/03/19  Dartha LodgeFord, Kelsey N, PA-C   omeprazole (PRILOSEC) 20 MG capsule Take 1 capsule (20 mg total) by mouth daily. Patient not taking: Reported on 02/14/2019 06/24/18 02/14/19  Roxy HorsemanBrowning, Robert, PA-C    Family History Family History  Problem Relation Age of Onset  . Diabetes Mother   . Hypertension Mother   . Cancer Mother   . Cancer Father   . Diabetes Father   . Hypertension Father     Social History Social History   Tobacco Use  . Smoking status: Current Every Day Smoker    Packs/day: 1.00    Years: 10.00    Pack years: 10.00    Types: Cigarettes  . Smokeless tobacco: Never Used  Substance Use Topics  . Alcohol use: No    Frequency: Never  . Drug use: Yes    Types: Marijuana, Methamphetamines    Comment: heroine      Allergies   Patient has no known allergies.   Review of Systems Review of Systems  Constitutional: Negative for chills and fever.  HENT: Positive for mouth sores.   Genitourinary: Negative for discharge, dysuria, penile pain and penile swelling.  Skin: Positive for rash.     Physical Exam Updated Vital Signs BP (!) 149/91   Pulse (!) 115   Temp 98.7 F (37.1 C) (Oral)   Resp 16   SpO2 98%   Physical Exam Vitals signs and nursing note reviewed.  Constitutional:      General: He is not in acute distress.    Appearance: Normal appearance. He is well-developed and normal weight. He is not diaphoretic.  HENT:     Head: Normocephalic and atraumatic.     Mouth/Throat:     Comments: white lesions coating the tongue consistent with oral candidiasis.  Posterior oropharynx clear, tolerating secretions. Eyes:     General:        Right eye: No discharge.        Left eye: No discharge.  Cardiovascular:     Rate and Rhythm: Normal rate and regular rhythm.     Heart sounds: Normal heart sounds.     Comments: Initially tachycardic, but with normal rate and rhythm on my exam. Pulmonary:     Effort: Pulmonary effort is normal. No respiratory distress.     Breath sounds: Normal  breath sounds.  Skin:    General: Skin is warm and dry.  Neurological:     Mental Status: He is alert and oriented to person, place, and time.     Coordination: Coordination normal.  Psychiatric:        Mood and Affect: Mood normal.        Behavior: Behavior normal.      ED Treatments / Results  Labs (all labs ordered are listed, but only abnormal results are displayed) Labs Reviewed  GC/CHLAMYDIA PROBE AMP ([place]) NOT AT [facility]    EKG None  Radiology No results found.  Procedures Procedures (including critical care time)  Medications Ordered in ED Medications  cefTRIAXone (ROCEPHIN) injection 250 mg (250 mg Intramuscular Given 02/24/19 1444)  azithromycin (ZITHROMAX) tablet 1,000 mg (1,000 mg Oral Given 02/24/19 1441)  penicillin g benzathine (BICILLIN LA) 1200000 UNIT/2ML injection 2.4 Million Units (2.4 Million Units Intramuscular Given 02/24/19 1450)  lidocaine (PF) (XYLOCAINE) 1 % injection (5 mLs  Given 02/24/19 1445)     Initial Impression / Assessment and Plan / ED Course  I have reviewed the triage vital signs and the nursing notes.  Pertinent labs & imaging results that were available during my care of the patient were reviewed by me and considered in my medical decision making (see chart for details).  Patient presents requesting STD treatment, recently tested positive for syphilis but had refused treatment previously.  He was also supposed to be tested for [facility] and chlamydia, but this test was not correctly completed.  We will test with urine today, patient is currently asymptomatic.  Will treat with penicillin for syphilis, Rocephin and is a throw given for coverage of GC and chlamydia.  Patient has been notified that he is HIV positive and infectious disease has been trying to reach him, I discussed with him that he can continue to receive free treatment for his sexually transmitted diseases.  Today he gave me a phone number so that the infectious disease clinic  should be able to reach him, I provided the patient with a referral and encouraged him to call the clinic tomorrow morning.  He also has evidence of oral candidiasis on exam, will treat with Mycelex troches.  Return precautions discussed and the importance of close follow-up with infectious disease stressed.  Patient expresses understanding and agreement with plan.  Final Clinical Impressions(s) / ED Diagnoses   Final diagnoses:  Syphilis  HIV infection, unspecified symptom status ([place])  Oral candidiasis    ED Discharge Orders         Ordered    clotrimazole (MYCELEX) 10  MG troche  5 times daily     02/24/19 1338    Ambulatory referral to Infectious Disease    Comments: HIV positive, syphillis   02/24/19 1338           Dartha LodgeFord, Kelsey N, PA-C 02/24/19 1556    Milagros Lollykstra, Richard S, MD 02/25/19 1332

## 2019-02-24 NOTE — ED Notes (Signed)
Pt was inpatient & ready to leave so he could leave & smoke a cigarette. Pt was given discharge papers so he could leave.

## 2019-02-24 NOTE — ED Triage Notes (Signed)
Pt here for "STD treatment" that he refused last time he was here because he was  "too tired for a shot." Pt states he has spots to tongue and was told he needed to be checked for HIV. Denies penile drainage. Pt does have a rash to groin.

## 2019-02-24 NOTE — Discharge Instructions (Signed)
You were treated today for syphilis, gonorrhea and chlamydia.  You have tested positive for HIV previously and you will need to follow-up at the infectious disease clinic, you should call them tomorrow morning to schedule an appointment, this is extremely important.  Please use Mycelex lozenges as prescribed to help treat the white spots on your tongue.

## 2019-02-26 LAB — GC/CHLAMYDIA PROBE AMP (~~LOC~~) NOT AT ARMC
Chlamydia: NEGATIVE
Neisseria Gonorrhea: NEGATIVE

## 2019-03-13 ENCOUNTER — Emergency Department (HOSPITAL_COMMUNITY): Payer: Self-pay

## 2019-03-13 ENCOUNTER — Other Ambulatory Visit: Payer: Self-pay

## 2019-03-13 ENCOUNTER — Encounter (HOSPITAL_COMMUNITY): Payer: Self-pay | Admitting: Emergency Medicine

## 2019-03-13 ENCOUNTER — Emergency Department (HOSPITAL_COMMUNITY)
Admission: EM | Admit: 2019-03-13 | Discharge: 2019-03-13 | Disposition: A | Discharge status: Home / Self Care | Payer: Self-pay | Attending: Emergency Medicine | Admitting: Emergency Medicine

## 2019-03-13 DIAGNOSIS — F909 Attention-deficit hyperactivity disorder, unspecified type: Secondary | ICD-10-CM | POA: Insufficient documentation

## 2019-03-13 DIAGNOSIS — F17210 Nicotine dependence, cigarettes, uncomplicated: Secondary | ICD-10-CM | POA: Insufficient documentation

## 2019-03-13 DIAGNOSIS — R1032 Left lower quadrant pain: Secondary | ICD-10-CM | POA: Insufficient documentation

## 2019-03-13 DIAGNOSIS — R109 Unspecified abdominal pain: Secondary | ICD-10-CM

## 2019-03-13 LAB — BASIC METABOLIC PANEL WITH GFR
Anion gap: 9 (ref 5–15)
BUN: 9 mg/dL (ref 6–20)
CO2: 29 mmol/L (ref 22–32)
Calcium: 9.6 mg/dL (ref 8.9–10.3)
Chloride: 98 mmol/L (ref 98–111)
Creatinine, Ser: 0.99 mg/dL (ref 0.61–1.24)
GFR calc Af Amer: >60 mL/min
GFR calc non Af Amer: >60 mL/min
Glucose, Bld: 89 mg/dL (ref 70–99)
Potassium: 3.9 mmol/L (ref 3.5–5.1)
Sodium: 136 mmol/L (ref 135–145)

## 2019-03-13 LAB — CBC
HCT: 43.4 % (ref 39.0–52.0)
Hemoglobin: 14.6 g/dL (ref 13.0–17.0)
MCH: 29.6 pg (ref 26.0–34.0)
MCHC: 33.6 g/dL (ref 30.0–36.0)
MCV: 87.9 fL (ref 80.0–100.0)
Platelets: 263 10*3/uL (ref 150–400)
RBC: 4.94 MIL/uL (ref 4.22–5.81)
RDW: 14.3 % (ref 11.5–15.5)
WBC: 4.6 10*3/uL (ref 4.0–10.5)
nRBC: 0 % (ref 0.0–0.2)

## 2019-03-13 LAB — URINALYSIS, MICROSCOPIC (REFLEX)
Bacteria, UA: NONE SEEN
RBC / HPF: >50 RBC/hpf (ref 0–5)

## 2019-03-13 LAB — URINALYSIS, ROUTINE W REFLEX MICROSCOPIC
Bilirubin Urine: NEGATIVE
Glucose, UA: NEGATIVE mg/dL
Ketones, ur: NEGATIVE mg/dL
Leukocytes,Ua: NEGATIVE
Nitrite: NEGATIVE
Protein, ur: NEGATIVE mg/dL
Specific Gravity, Urine: 1.02 (ref 1.005–1.030)
pH: 7.5 (ref 5.0–8.0)

## 2019-03-13 MED ORDER — OXYCODONE-ACETAMINOPHEN 5-325 MG PO TABS
1.0000 | ORAL_TABLET | ORAL | Status: DC | PRN
  Filled 2019-03-13: qty 1

## 2019-03-13 NOTE — ED Notes (Signed)
Pt unable to wait on discharge papers. Pt stable and ambulatory.

## 2019-03-13 NOTE — ED Triage Notes (Signed)
Pt reports extreme left flank pain radiates into abd pt is in a lot of pain during triage and bent over.

## 2019-03-13 NOTE — Discharge Instructions (Addendum)
Please read attached information. If you experience any new or worsening signs or symptoms please return to the emergency room for evaluation. Please follow-up with your primary care provider or specialist as discussed. Please use medication prescribed only as directed and discontinue taking if you have any concerning signs or symptoms.   °

## 2019-03-13 NOTE — ED Provider Notes (Signed)
[name] Provider Note   CSN: [id number] Arrival date & time: 03/13/19  1337     History   Chief Complaint Chief Complaint  Patient presents with  . Flank Pain    HPI [name] is a 33 y.o. male.     HPI   33 year old male presents today with complaints of sudden onset flank pain.  Patient notes shortly prior to arrival he had severe left-sided flank pain.  Denies any pain presently, denies any nausea vomiting fever urinary symptoms or any other concerns.  No history of the same.  No medications prior to arrival. Sudden onset flank pain left flank pain gone now.   Past Medical History:  Diagnosis Date  . ADHD (attention deficit hyperactivity disorder)   . Drug abuse (HCC)    meth, marijuana    There are no active problems to display for this patient.   No past surgical history on file.      Home Medications    Prior to Admission medications   Medication Sig Start Date End Date Taking? Authorizing Provider  omeprazole (PRILOSEC) 20 MG capsule Take 1 capsule (20 mg total) by mouth daily. Patient not taking: Reported on 02/14/2019 06/24/18 02/14/19  Roxy HorsemanBrowning, Robert, PA-C    Family History Family History  Problem Relation Age of Onset  . Diabetes Mother   . Hypertension Mother   . Cancer Mother   . Cancer Father   . Diabetes Father   . Hypertension Father     Social History Social History   Tobacco Use  . Smoking status: Current Every Day Smoker    Packs/day: 1.00    Years: 10.00    Pack years: 10.00    Types: Cigarettes  . Smokeless tobacco: Never Used  Substance Use Topics  . Alcohol use: No    Frequency: Never  . Drug use: Yes    Types: Marijuana, Methamphetamines    Comment: heroine  last used meth yesterday      Allergies   Patient has no known allergies.   Review of Systems Review of Systems  All other systems reviewed and are negative.    Physical Exam Updated Vital Signs BP 125/71 (BP  Location: Right Arm)   Pulse 86   Temp 97.8 F (36.6 C) (Oral)   Resp 20   SpO2 100%   Physical Exam Vitals signs and nursing note reviewed.  Constitutional:      Appearance: He is well-developed.  HENT:     Head: Normocephalic and atraumatic.  Eyes:     General: No scleral icterus.       Right eye: No discharge.        Left eye: No discharge.     Conjunctiva/sclera: Conjunctivae normal.     Pupils: Pupils are equal, round, and reactive to light.  Neck:     Musculoskeletal: Normal range of motion.     Vascular: No JVD.     Trachea: No tracheal deviation.  Pulmonary:     Effort: Pulmonary effort is normal.     Breath sounds: No stridor.  Abdominal:     General: Abdomen is flat. There is no distension.     Palpations: Abdomen is soft. There is no mass.     Tenderness: There is no abdominal tenderness. There is no guarding or rebound.     Hernia: No hernia is present.  Neurological:     Mental Status: He is alert and oriented to person, place, and time.  Coordination: Coordination normal.  Psychiatric:        Behavior: Behavior normal.        Thought Content: Thought content normal.        Judgment: Judgment normal.      ED Treatments / Results  Labs (all labs ordered are listed, but only abnormal results are displayed) Labs Reviewed  URINALYSIS, ROUTINE W REFLEX MICROSCOPIC - Abnormal; Notable for the following components:      Result Value   APPearance CLOUDY (*)    Hgb urine dipstick LARGE (*)    All other components within normal limits  BASIC METABOLIC PANEL  CBC  URINALYSIS, MICROSCOPIC (REFLEX)    EKG None  Radiology Ct Renal Stone Study  Result Date: 03/13/2019 CLINICAL DATA:  Left flank pain. EXAM: CT ABDOMEN AND PELVIS WITHOUT CONTRAST TECHNIQUE: Multidetector CT imaging of the abdomen and pelvis was performed following the standard protocol without IV contrast. COMPARISON:  CT abdomen pelvis dated June 24, 2018. FINDINGS: Lower chest: No  acute abnormality. Stable 4 mm nodule in the peripheral left lower lobe (series 7, image 17). Hepatobiliary: No focal liver abnormality is seen. No gallstones, gallbladder wall thickening, or biliary dilatation. Pancreas: Unremarkable. No pancreatic ductal dilatation or surrounding inflammatory changes. Spleen: Normal in size without focal abnormality. Adrenals/Urinary Tract: Adrenal glands are unremarkable. Kidneys are normal, without renal calculi, focal lesion, or hydronephrosis. Bladder is decompressed. Stomach/Bowel: Stomach is within normal limits. Appendix appears normal. No evidence of bowel wall thickening, distention, or inflammatory changes. Vascular/Lymphatic: No significant vascular findings are present. No enlarged abdominal or pelvic lymph nodes. Reproductive: Prostate is unremarkable. Other: Unchanged tiny fat containing umbilical hernia. No free fluid or pneumoperitoneum. Musculoskeletal: No acute or significant osseous findings. IMPRESSION: 1.  No acute intra-abdominal process.  No urolithiasis. Electronically Signed   By: Titus Dubin M.D.   On: 03/13/2019 17:54    Procedures Procedures (including critical care time)  Medications Ordered in ED Medications - No data to display   Initial Impression / Assessment and Plan / ED Course  I have reviewed the triage vital signs and the nursing notes.  Pertinent labs & imaging results that were available during my care of the patient were reviewed by me and considered in my medical decision making (see chart for details).         Assessment/Plan: 33 year old male presents today with abdominal pain and flank pain.  Question passed kidney stone, reassuring evaluation here with no signs of acute intra-abdominal pathology.  CT with no acute abnormalities.  Patient discharged with strict return precautions.  Verbalized understanding and agreement to this plan had no further questions or concerns.    Final Clinical Impressions(s) / ED  Diagnoses   Final diagnoses:  Flank pain    ED Discharge Orders    None       Francee Gentile 03/14/19 1232    Lucrezia Starch, MD 03/16/19 [phone]

## 2019-03-19 LAB — HIV GENOSURE(R) MG

## 2019-03-19 LAB — REFLEX TO GENOSURE(R) MG EDI: HIV GenoSure(R): 1

## 2019-03-19 LAB — HIV-1 RNA, PCR (GRAPH) RFX/GENO EDI
HIV-1 RNA BY PCR: 1470000 copies/mL
HIV-1 RNA Quant, Log: 6.167 log10copy/mL

## 2019-05-17 ENCOUNTER — Other Ambulatory Visit: Payer: Self-pay | Admitting: *Deleted

## 2019-05-17 DIAGNOSIS — B20 Human immunodeficiency virus [HIV] disease: Secondary | ICD-10-CM

## 2019-05-17 DIAGNOSIS — Z113 Encounter for screening for infections with a predominantly sexual mode of transmission: Secondary | ICD-10-CM

## 2019-05-17 DIAGNOSIS — Z79899 Other long term (current) drug therapy: Secondary | ICD-10-CM

## 2019-05-20 ENCOUNTER — Other Ambulatory Visit: Payer: Self-pay

## 2019-05-20 ENCOUNTER — Ambulatory Visit: Payer: Self-pay

## 2019-06-05 ENCOUNTER — Telehealth: Payer: Self-pay | Admitting: Pharmacy Technician

## 2019-06-05 ENCOUNTER — Ambulatory Visit: Payer: Self-pay | Admitting: Pharmacist

## 2019-06-05 ENCOUNTER — Encounter: Payer: Self-pay | Admitting: Infectious Diseases

## 2019-06-05 DIAGNOSIS — B20 Human immunodeficiency virus [HIV] disease: Secondary | ICD-10-CM | POA: Insufficient documentation

## 2019-06-05 NOTE — Progress Notes (Deleted)
   Name: [name]  DOB: [birth year] MRN: [id number] PCP: Patient, No Pcp Per    Patient Active Problem List   Diagnosis Date Noted  . HIV (human immunodeficiency virus infection) ([place]) 06/05/2019     Brief Narrative:  [name] is a 33 y.o. male with HIV disease, Dx ***. AIDS + CD4 nadir *** VL *** HIV Risk: *** History of OIs: *** Intake Labs ***: Hep B sAg (***), sAb (***), cAb (***); Hep A (***), Hep C (***) Quantiferon (***) HLA B*5701 (***) G6PD: (***)  Previous Regimens: . naive  Genotypes: . 01/2019 - sensitive   Subjective:  No chief complaint on file.    HPI: ***  No flowsheet data found.  Health Maintenance = Receives annual preventative care through {his/her} PCP and is up to date on all recommended screenings and vaccinations. {He/She} denies cigarette use, excessive alcohol use and other substance abuse. {He/She} is seeing a dentist regularly and no dental complaints today.   ROS  Past Medical History:  Diagnosis Date  . ADHD (attention deficit hyperactivity disorder)   . Drug abuse ([place])    meth, marijuana    No outpatient medications prior to visit.   No facility-administered medications prior to visit.      No Known Allergies  Social History   Tobacco Use  . Smoking status: Current Every Day Smoker    Packs/day: 1.00    Years: 10.00    Pack years: 10.00    Types: Cigarettes  . Smokeless tobacco: Never Used  Substance Use Topics  . Alcohol use: No    Frequency: Never  . Drug use: Yes    Types: Marijuana, Methamphetamines    Comment: heroine  last used meth yesterday     Family History  Problem Relation Age of Onset  . Diabetes Mother   . Hypertension Mother   . Cancer Mother   . Cancer Father   . Diabetes Father   . Hypertension Father     Social History   Substance and Sexual Activity  Sexual Activity Yes  . Birth control/protection: None     Objective:  There were no vitals filed for this visit. There is no  height or weight on file to calculate BMI.  Physical Exam  Lab Results Lab Results  Component Value Date   WBC 4.6 03/13/2019   HGB 14.6 03/13/2019   HCT 43.4 03/13/2019   MCV 87.9 03/13/2019   PLT 263 03/13/2019    Lab Results  Component Value Date   CREATININE 0.99 03/13/2019   BUN 9 03/13/2019   NA 136 03/13/2019   K 3.9 03/13/2019   CL 98 03/13/2019   CO2 29 03/13/2019    Lab Results  Component Value Date   ALT 35 02/13/2019   AST 40 02/13/2019   ALKPHOS 70 02/13/2019   BILITOT 0.6 02/13/2019    No results found for: CHOL, HDL, LDLCALC, LDLDIRECT, TRIG, CHOLHDL No results found for: HIV1RNAQUANT, HIV1RNAVL, CD4TABS   Assessment & Plan:   Problem List Items Addressed This Visit      Unprioritized   HIV (human immunodeficiency virus infection) ([place])      Janene Madeira, MSN, NP-C [facility] [place] Pager: [phone] Office: [phone]  06/05/19  1:07 PM

## 2019-06-05 NOTE — Telephone Encounter (Signed)
[facility] Patient Advocate Encounter ° °Insurance verification completed.   ° °The patient is uninsured and will need patient assistance for medication. ° °We can complete the application and will need to meet with the patient for signatures and income documentation. ° °Rilynne Lonsway E. Maysoon Lozada, CPhT °[facility] Patient Advocate °[facility] °Phone: [phone] °Fax:  [phone] ° ° °

## 2019-06-15 ENCOUNTER — Encounter (HOSPITAL_COMMUNITY): Payer: Self-pay | Admitting: Emergency Medicine

## 2019-06-15 ENCOUNTER — Emergency Department (HOSPITAL_COMMUNITY)
Admission: EM | Admit: 2019-06-15 | Discharge: 2019-06-15 | Disposition: A | Discharge status: Home / Self Care | Payer: Self-pay | Attending: Emergency Medicine | Admitting: Emergency Medicine

## 2019-06-15 DIAGNOSIS — M25571 Pain in right ankle and joints of right foot: Secondary | ICD-10-CM | POA: Insufficient documentation

## 2019-06-15 DIAGNOSIS — Z21 Asymptomatic human immunodeficiency virus [HIV] infection status: Secondary | ICD-10-CM | POA: Insufficient documentation

## 2019-06-15 DIAGNOSIS — G8929 Other chronic pain: Secondary | ICD-10-CM | POA: Insufficient documentation

## 2019-06-15 DIAGNOSIS — M25572 Pain in left ankle and joints of left foot: Secondary | ICD-10-CM | POA: Insufficient documentation

## 2019-06-15 DIAGNOSIS — F17210 Nicotine dependence, cigarettes, uncomplicated: Secondary | ICD-10-CM | POA: Insufficient documentation

## 2019-06-15 MED ORDER — IBUPROFEN 800 MG PO TABS
800.0000 mg | ORAL_TABLET | Freq: Once | ORAL | Status: AC
  Administered 2019-06-15: 800 mg via ORAL
  Filled 2019-06-15: qty 1

## 2019-06-15 NOTE — Discharge Instructions (Signed)
Elevate feet,

## 2019-06-15 NOTE — ED Triage Notes (Signed)
Pt here with c/o bil ankle pain after walking  a lot , pt is homeless , no swelling or trauma noted

## 2019-06-15 NOTE — ED Provider Notes (Signed)
[name] EMERGENCY DEPARTMENT Provider Note   CSN: [id number] Arrival date & time: 06/15/19  1017     History   Chief Complaint No chief complaint on file.   HPI [name] is a 33 y.o. male.     The history is provided by the patient. No language interpreter was used.  Ankle Pain Location:  Ankle Ankle location:  L ankle and R ankle Pain details:    Quality:  Aching   Radiates to:  Does not radiate   Severity:  Moderate   Onset quality:  Gradual   Timing:  Constant   Progression:  Worsening Chronicity:  New Dislocation: no   Foreign body present:  No foreign bodies Relieved by:  Nothing Worsened by:  Nothing Ineffective treatments:  None tried Risk factors: no known bone disorder     Past Medical History:  Diagnosis Date  . ADHD (attention deficit hyperactivity disorder)   . Drug abuse (HCC)    meth, marijuana    Patient Active Problem List   Diagnosis Date Noted  . HIV (human immunodeficiency virus infection) (HCC) 06/05/2019    No past surgical history on file.      Home Medications    Prior to Admission medications   Medication Sig Start Date End Date Taking? Authorizing Provider  omeprazole (PRILOSEC) 20 MG capsule Take 1 capsule (20 mg total) by mouth daily. Patient not taking: Reported on 02/14/2019 06/24/18 02/14/19  Roxy Horseman, PA-C    Family History Family History  Problem Relation Age of Onset  . Diabetes Mother   . Hypertension Mother   . Cancer Mother   . Cancer Father   . Diabetes Father   . Hypertension Father     Social History Social History   Tobacco Use  . Smoking status: Current Every Day Smoker    Packs/day: 1.00    Years: 10.00    Pack years: 10.00    Types: Cigarettes  . Smokeless tobacco: Never Used  Substance Use Topics  . Alcohol use: No    Frequency: Never  . Drug use: Yes    Types: Marijuana, Methamphetamines    Comment: heroine  last used meth yesterday      Allergies    Patient has no known allergies.   Review of Systems Review of Systems  All other systems reviewed and are negative.    Physical Exam Updated Vital Signs BP 132/77 (BP Location: Left Arm)   Pulse 85   Temp 98.6 F (37 C) (Oral)   Resp 10   SpO2 99%   Physical Exam Vitals signs reviewed.  Musculoskeletal:        General: Swelling and tenderness present.  Skin:    General: Skin is warm.  Neurological:     General: No focal deficit present.  Psychiatric:        Mood and Affect: Mood normal.      ED Treatments / Results  Labs (all labs ordered are listed, but only abnormal results are displayed) Labs Reviewed - No data to display  EKG None  Radiology No results found.  Procedures Procedures (including critical care time)  Medications Ordered in ED Medications  ibuprofen (ADVIL) tablet 800 mg (has no administration in time range)     Initial Impression / Assessment and Plan / ED Course  I have reviewed the triage vital signs and the nursing notes.  Pertinent labs & imaging results that were available during my care of the patient were reviewed  by me and considered in my medical decision making (see chart for details).       MDM  Pt has swollen ankle.  Pt is homeless and walks all the time.  Pt reports he does not have anywhere to stay.  Pt given ibuprofen and resource guide.     Final Clinical Impressions(s) / ED Diagnoses   Final diagnoses:  Chronic pain of both ankles    ED Discharge Orders    None    An After Visit Summary was printed and given to the patient.   Fransico Meadow, PA-C 06/15/19 [place], DO 06/15/19 1155

## 2019-06-25 ENCOUNTER — Other Ambulatory Visit: Payer: Self-pay

## 2019-06-25 ENCOUNTER — Emergency Department (HOSPITAL_COMMUNITY)
Admission: EM | Admit: 2019-06-25 | Discharge: 2019-06-26 | Disposition: A | Discharge status: Home / Self Care | Payer: Self-pay | Attending: Emergency Medicine | Admitting: Emergency Medicine

## 2019-06-25 ENCOUNTER — Encounter (HOSPITAL_COMMUNITY): Payer: Self-pay | Admitting: Emergency Medicine

## 2019-06-25 DIAGNOSIS — F909 Attention-deficit hyperactivity disorder, unspecified type: Secondary | ICD-10-CM | POA: Insufficient documentation

## 2019-06-25 DIAGNOSIS — B20 Human immunodeficiency virus [HIV] disease: Secondary | ICD-10-CM | POA: Insufficient documentation

## 2019-06-25 DIAGNOSIS — M25571 Pain in right ankle and joints of right foot: Secondary | ICD-10-CM | POA: Insufficient documentation

## 2019-06-25 DIAGNOSIS — M79671 Pain in right foot: Secondary | ICD-10-CM

## 2019-06-25 DIAGNOSIS — Z590 Homelessness: Secondary | ICD-10-CM | POA: Insufficient documentation

## 2019-06-25 DIAGNOSIS — F17210 Nicotine dependence, cigarettes, uncomplicated: Secondary | ICD-10-CM | POA: Insufficient documentation

## 2019-06-25 DIAGNOSIS — M25572 Pain in left ankle and joints of left foot: Secondary | ICD-10-CM | POA: Insufficient documentation

## 2019-06-25 NOTE — ED Triage Notes (Addendum)
Pt to ED with c/o bil ankle pain after walking a lot   No known injury  Pt sleeping while being triaged

## 2019-06-26 NOTE — ED Provider Notes (Signed)
[name] EMERGENCY DEPARTMENT Provider Note   CSN: [id number] Arrival date & time: 06/25/19  2334     History   Chief Complaint Chief Complaint  Patient presents with  . Bil Ankle Pain    HPI [name] is a 33 y.o. male.     The history is provided by the patient and medical records.     33 year old male with history of ADHD, drug abuse, homelessness, presenting to the ED with bilateral ankle pain.  Patient is well-known to myself from prior ED visits for same.  States he was walking a lot today, no place in particular, rather just wandering around town.  States his feet ankles usually hurt when he has walking a lot.  He did get some new shoes and socks.  Past Medical History:  Diagnosis Date  . ADHD (attention deficit hyperactivity disorder)   . Drug abuse (HCC)    meth, marijuana    Patient Active Problem List   Diagnosis Date Noted  . HIV (human immunodeficiency virus infection) (HCC) 06/05/2019    History reviewed. No pertinent surgical history.      Home Medications    Prior to Admission medications   Medication Sig Start Date End Date Taking? Authorizing Provider  omeprazole (PRILOSEC) 20 MG capsule Take 1 capsule (20 mg total) by mouth daily. Patient not taking: Reported on 02/14/2019 06/24/18 02/14/19  Roxy Horseman, PA-C    Family History Family History  Problem Relation Age of Onset  . Diabetes Mother   . Hypertension Mother   . Cancer Mother   . Cancer Father   . Diabetes Father   . Hypertension Father     Social History Social History   Tobacco Use  . Smoking status: Current Every Day Smoker    Packs/day: 1.00    Years: 10.00    Pack years: 10.00    Types: Cigarettes  . Smokeless tobacco: Never Used  Substance Use Topics  . Alcohol use: No    Frequency: Never  . Drug use: Yes    Types: Marijuana, Methamphetamines    Comment: heroine  last used meth yesterday      Allergies   Patient has no known  allergies.   Review of Systems Review of Systems  Musculoskeletal: Positive for arthralgias.  All other systems reviewed and are negative.    Physical Exam Updated Vital Signs BP 126/81 (BP Location: Right Arm)   Pulse 96   Temp 97.9 F (36.6 C) (Oral)   Ht 6\' 1"  (1.854 m)   Wt 77.1 kg   SpO2 98%   BMI 22.43 kg/m   Physical Exam Vitals signs and nursing note reviewed.  Constitutional:      Appearance: He is well-developed.  HENT:     Head: Normocephalic and atraumatic.  Eyes:     Conjunctiva/sclera: Conjunctivae normal.     Pupils: Pupils are equal, round, and reactive to light.  Neck:     Musculoskeletal: Normal range of motion.  Cardiovascular:     Rate and Rhythm: Normal rate and regular rhythm.     Heart sounds: Normal heart sounds.  Pulmonary:     Effort: Pulmonary effort is normal. No respiratory distress.     Breath sounds: Normal breath sounds. No stridor.  Abdominal:     General: Bowel sounds are normal.     Palpations: Abdomen is soft.  Musculoskeletal: Normal range of motion.     Comments: No deformities of the feet or ankles, no signs  of trauma, ambulatory, feet are warm/dry Has new socks and shoes  Skin:    General: Skin is warm and dry.  Neurological:     Mental Status: He is alert and oriented to person, place, and time.      ED Treatments / Results  Labs (all labs ordered are listed, but only abnormal results are displayed) Labs Reviewed - No data to display  EKG None  Radiology No results found.  Procedures Procedures (including critical care time)  Medications Ordered in ED Medications - No data to display   Initial Impression / Assessment and Plan / ED Course  I have reviewed the triage vital signs and the nursing notes.  Pertinent labs & imaging results that were available during my care of the patient were reviewed by me and considered in my medical decision making (see chart for details).  33 year old male here with  bilateral ankle pain.  Well-known to myself from prior ED visits for same.  He is homeless and it is quite cold tonight.  He does not appear to have any emergent medical condition involving his feet or ankles.  His shoes and socks are clean and dry along with his feet.  He was made aware that shelters have white flag tonight due to freezing temperatures, he was given information for local facilities.  He may return here for any new or acute changes.  Final Clinical Impressions(s) / ED Diagnoses   Final diagnoses:  Bilateral foot pain    ED Discharge Orders    None       Larene Pickett, PA-C 06/26/19 0125    Palumbo, April, MD 06/26/19 [phone]

## 2019-06-26 NOTE — Discharge Instructions (Addendum)
Shelters have white flag due to freezing temperatures, can try to go there to stay warm. See attached sheet for information.

## 2019-06-26 NOTE — ED Notes (Signed)
Warm blankets & sprite given.

## 2019-06-28 ENCOUNTER — Ambulatory Visit: Payer: Self-pay

## 2019-06-28 ENCOUNTER — Other Ambulatory Visit: Payer: Self-pay

## 2019-07-03 ENCOUNTER — Other Ambulatory Visit: Payer: Self-pay

## 2019-07-03 ENCOUNTER — Encounter (HOSPITAL_COMMUNITY): Payer: Self-pay | Admitting: Emergency Medicine

## 2019-07-03 ENCOUNTER — Emergency Department (HOSPITAL_COMMUNITY)
Admission: EM | Admit: 2019-07-03 | Discharge: 2019-07-03 | Disposition: A | Discharge status: Home / Self Care | Payer: Self-pay | Attending: Emergency Medicine | Admitting: Emergency Medicine

## 2019-07-03 ENCOUNTER — Emergency Department (HOSPITAL_COMMUNITY): Payer: Self-pay

## 2019-07-03 DIAGNOSIS — Y929 Unspecified place or not applicable: Secondary | ICD-10-CM | POA: Insufficient documentation

## 2019-07-03 DIAGNOSIS — Y9301 Activity, walking, marching and hiking: Secondary | ICD-10-CM | POA: Insufficient documentation

## 2019-07-03 DIAGNOSIS — F17210 Nicotine dependence, cigarettes, uncomplicated: Secondary | ICD-10-CM | POA: Insufficient documentation

## 2019-07-03 DIAGNOSIS — S93491A Sprain of other ligament of right ankle, initial encounter: Secondary | ICD-10-CM | POA: Insufficient documentation

## 2019-07-03 DIAGNOSIS — M79672 Pain in left foot: Secondary | ICD-10-CM | POA: Insufficient documentation

## 2019-07-03 DIAGNOSIS — X503XXA Overexertion from repetitive movements, initial encounter: Secondary | ICD-10-CM | POA: Insufficient documentation

## 2019-07-03 DIAGNOSIS — Y999 Unspecified external cause status: Secondary | ICD-10-CM | POA: Insufficient documentation

## 2019-07-03 DIAGNOSIS — Z21 Asymptomatic human immunodeficiency virus [HIV] infection status: Secondary | ICD-10-CM | POA: Insufficient documentation

## 2019-07-03 DIAGNOSIS — Z590 Homelessness: Secondary | ICD-10-CM | POA: Insufficient documentation

## 2019-07-03 DIAGNOSIS — M79671 Pain in right foot: Secondary | ICD-10-CM | POA: Insufficient documentation

## 2019-07-03 MED ORDER — IBUPROFEN 600 MG PO TABS: 600.0000 mg | ORAL_TABLET | Freq: Four times a day (QID) | ORAL | 0 refills | Status: DC | PRN

## 2019-07-03 NOTE — ED Notes (Signed)
Patient left before discharge process could be completed.  

## 2019-07-03 NOTE — ED Provider Notes (Signed)
[facility]-EMERGENCY DEPT Provider Note   CSN: [id number] Arrival date & time: 07/03/19  1414     History Chief Complaint  Patient presents with  . Ankle Pain    [name] is a 33 y.o. male.  The history is provided by the patient. No language interpreter was used.  Ankle Pain Associated symptoms: no fever        33 year old homeless male with hx of HIV, ADHD, drug abuse here with foot pain.  Patient states due to being homeless, he has to walk a long distance.  Yesterday while walking he sprained his right ankle.  States that it rolled and he report acute onset of sharp pain to the medial aspect of his ankle.  Pain is 7 out of 10, nonradiating worsening with ambulation.  He has been able to ambulate.  He does complain of bilateral ankle pain right greater than left.  He denies any other injury.  He denies any specific treatment tried.  Patient denies fever or chills no shortness of breath no chest pain no numbness or weakness.   Past Medical History:  Diagnosis Date  . ADHD (attention deficit hyperactivity disorder)   . Drug abuse (HCC)    meth, marijuana    Patient Active Problem List   Diagnosis Date Noted  . HIV (human immunodeficiency virus infection) (HCC) 06/05/2019    History reviewed. No pertinent surgical history.     Family History  Problem Relation Age of Onset  . Diabetes Mother   . Hypertension Mother   . Cancer Mother   . Cancer Father   . Diabetes Father   . Hypertension Father     Social History   Tobacco Use  . Smoking status: Current Every Day Smoker    Packs/day: 1.00    Years: 10.00    Pack years: 10.00    Types: Cigarettes  . Smokeless tobacco: Never Used  Substance Use Topics  . Alcohol use: No  . Drug use: Yes    Types: Marijuana, Methamphetamines    Comment: heroine  last used meth yesterday     Home Medications Prior to Admission medications   Medication Sig Start Date End Date Taking? Authorizing  Provider  omeprazole (PRILOSEC) 20 MG capsule Take 1 capsule (20 mg total) by mouth daily. Patient not taking: Reported on 02/14/2019 06/24/18 02/14/19  Roxy Horseman, PA-C    Allergies    Patient has no known allergies.  Review of Systems   Review of Systems  Constitutional: Negative for fever.  Musculoskeletal: Positive for arthralgias.  Skin: Negative for wound.  Neurological: Negative for numbness.    Physical Exam Updated Vital Signs BP 128/87 (BP Location: Right Arm)   Pulse 72   Temp 97.9 F (36.6 C) (Oral)   Resp 16   SpO2 100%   Physical Exam Constitutional:      General: He is not in acute distress.    Appearance: He is well-developed.  HENT:     Head: Atraumatic.  Eyes:     Conjunctiva/sclera: Conjunctivae normal.  Musculoskeletal:        General: Tenderness (Right ankle: Tenderness to medial malleoli region on palpation with associated edema.  Ankle with full range of motion no deformity noted.  Dorsalis pedis pulse palpable.  No pain at fifth metatarsal region.) present.     Cervical back: Normal range of motion and neck supple.     Comments: Left ankle: Mild tenderness to lateral malleolus without swelling.  Ankle with full range of motion.  Skin:    Findings: No rash.  Neurological:     Mental Status: He is alert.     ED Results / Procedures / Treatments   Labs (all labs ordered are listed, but only abnormal results are displayed) Labs Reviewed - No data to display  EKG None  Radiology DG Ankle Complete Right  Result Date: 07/03/2019 CLINICAL DATA:  Ankle injury.  Pain EXAM: RIGHT ANKLE - COMPLETE 3+ VIEW COMPARISON:  None. FINDINGS: There is no evidence of fracture, dislocation, or joint effusion. There is no evidence of arthropathy or other focal bone abnormality. Soft tissues are unremarkable. IMPRESSION: Negative. Electronically Signed   By: Franchot Gallo M.D.   On: 07/03/2019 16:38    Procedures Procedures (including critical care  time)  Medications Ordered in ED Medications - No data to display  ED Course  I have reviewed the triage vital signs and the nursing notes.  Pertinent labs & imaging results that were available during my care of the patient were reviewed by me and considered in my medical decision making (see chart for details).    MDM Rules/Calculators/A&P                      BP 128/87 (BP Location: Right Arm)   Pulse 72   Temp 97.9 F (36.6 C) (Oral)   Resp 16   SpO2 100%   Final Clinical Impression(s) / ED Diagnoses Final diagnoses:  Sprain of anterior talofibular ligament of right ankle, initial encounter  Foot pain, bilateral    Rx / DC Orders ED Discharge Orders         Ordered    ibuprofen (ADVIL) 600 MG tablet  Every 6 hours PRN     07/03/19 1713         4:11 PM Patient here with pain and swelling to right ankle after he rolled his ankle yesterday.  He is homeless and report having to walk long distance daily.  Will obtain x-ray of the ankle to rule out fracture.  He is neurovascularly intact.  5:02 PM X-ray of the right ankle without any acute finding.  ASO ankle brace provided for support.  Rice therapy discussed.  Patient stable for discharge with anti-inflammatory medication.   Domenic Moras, PA-C 07/03/19 1713    Tegeler, Gwenyth Allegra, MD 07/03/19 1739

## 2019-07-03 NOTE — ED Triage Notes (Signed)
Pt reports bilat ankle pains after having to walk long distance last night. Reports twist and sprained his ankle. Reports that has flat feet.

## 2019-07-04 ENCOUNTER — Emergency Department (HOSPITAL_COMMUNITY)
Admission: EM | Admit: 2019-07-04 | Discharge: 2019-07-04 | Disposition: A | Discharge status: Home / Self Care | Payer: Self-pay | Attending: Emergency Medicine | Admitting: Emergency Medicine

## 2019-07-04 ENCOUNTER — Encounter (HOSPITAL_COMMUNITY): Payer: Self-pay | Admitting: *Deleted

## 2019-07-04 ENCOUNTER — Other Ambulatory Visit: Payer: Self-pay

## 2019-07-04 DIAGNOSIS — R600 Localized edema: Secondary | ICD-10-CM | POA: Insufficient documentation

## 2019-07-04 DIAGNOSIS — F909 Attention-deficit hyperactivity disorder, unspecified type: Secondary | ICD-10-CM | POA: Insufficient documentation

## 2019-07-04 DIAGNOSIS — F17210 Nicotine dependence, cigarettes, uncomplicated: Secondary | ICD-10-CM | POA: Insufficient documentation

## 2019-07-04 DIAGNOSIS — B20 Human immunodeficiency virus [HIV] disease: Secondary | ICD-10-CM | POA: Insufficient documentation

## 2019-07-04 MED ORDER — IBUPROFEN 400 MG PO TABS
600.0000 mg | ORAL_TABLET | Freq: Once | ORAL | Status: AC
  Administered 2019-07-04: 600 mg via ORAL
  Filled 2019-07-04: qty 1

## 2019-07-04 NOTE — ED Notes (Signed)
Pt dc'd home w/all belongings, a/o x4, escorted to waiting room via wheel chair.

## 2019-07-04 NOTE — Discharge Instructions (Addendum)
You can take 1 to 2 tablets of Tylenol (350mg -1000mg  depending on the dose) every 6 hours as needed for pain.  Do not exceed 4000 mg of Tylenol daily.  If your pain persists you can take a doses of ibuprofen in between doses of Tylenol.  I usually recommend 400 to 600 mg of ibuprofen every 6 hours.  Take this with food to avoid upset stomach issues.  Elevate the legs and apply Ace wrap for compression to help with swelling when you are not walking.  Can also apply ice.  Follow-up with [facility] or the infectious disease clinic for reevaluation and to start HIV medications.  This is extremely important.  Return to the emergency department if any concerning signs or symptoms develop.

## 2019-07-04 NOTE — ED Provider Notes (Signed)
[name] EMERGENCY DEPARTMENT Provider Note   CSN: [id number] Arrival date & time: 07/04/19  0340     History Chief Complaint  Patient presents with  . Leg Swelling    [name] is a 33 y.o. male with history of HIV disease currently not on any medications, methamphetamine and marijuana abuse, ADHD presents for evaluation of ongoing persistent bilateral lower extremity edema.  He is well-known to this ED and presents frequently with similar complaints.  He was actually seen yesterday for the same complaint.  At that time he complained that he sprained his right ankle but radiographs were negative.  Complains of bilateral ankle pain which worsens with persistent ambulation.  He is homeless and states that he walks a lot.  Denies numbness or fevers.  He was given an ASO ankle brace at the visit yesterday but is not currently wearing it.  Also of note he was recently diagnosed with HIV earlier this year but has not followed up with infectious disease specialist to start any medications.  The history is provided by the patient.       Past Medical History:  Diagnosis Date  . ADHD (attention deficit hyperactivity disorder)   . Drug abuse (HCC)    meth, marijuana    Patient Active Problem List   Diagnosis Date Noted  . HIV (human immunodeficiency virus infection) (HCC) 06/05/2019    History reviewed. No pertinent surgical history.     Family History  Problem Relation Age of Onset  . Diabetes Mother   . Hypertension Mother   . Cancer Mother   . Cancer Father   . Diabetes Father   . Hypertension Father     Social History   Tobacco Use  . Smoking status: Current Every Day Smoker    Packs/day: 1.00    Years: 10.00    Pack years: 10.00    Types: Cigarettes  . Smokeless tobacco: Never Used  Substance Use Topics  . Alcohol use: No  . Drug use: Yes    Types: Marijuana, Methamphetamines    Comment: last used 4 days ago    Home Medications Prior to  Admission medications   Medication Sig Start Date End Date Taking? Authorizing Provider  ibuprofen (ADVIL) 600 MG tablet Take 1 tablet (600 mg total) by mouth every 6 (six) hours as needed. 07/03/19   Fayrene Helper, PA-C  omeprazole (PRILOSEC) 20 MG capsule Take 1 capsule (20 mg total) by mouth daily. Patient not taking: Reported on 02/14/2019 06/24/18 02/14/19  Roxy Horseman, PA-C    Allergies    Patient has no known allergies.  Review of Systems   Review of Systems  Constitutional: Negative for chills and fever.  Respiratory: Negative for shortness of breath.   Cardiovascular: Positive for leg swelling.  Musculoskeletal: Positive for arthralgias.  Neurological: Negative for weakness and numbness.  All other systems reviewed and are negative.   Physical Exam Updated Vital Signs BP 128/85 (BP Location: Right Arm)   Pulse 83   Temp (!) 97.5 F (36.4 C) (Oral)   Resp 18   Ht 6\' 1"  (1.854 m)   Wt 77.1 kg   SpO2 100%   BMI 22.43 kg/m   Physical Exam Vitals and nursing note reviewed.  Constitutional:      General: He is not in acute distress.    Appearance: He is well-developed.  HENT:     Head: Normocephalic and atraumatic.  Eyes:     General:  Right eye: No discharge.        Left eye: No discharge.     Conjunctiva/sclera: Conjunctivae normal.  Neck:     Vascular: No JVD.     Trachea: No tracheal deviation.  Cardiovascular:     Rate and Rhythm: Normal rate.     Pulses: Normal pulses.     Comments: 1+ nonpitting edema of the bilateral lower extremities, right slightly worse than left. Homan's sign absent bilaterally, compartments are soft Pulmonary:     Effort: Pulmonary effort is normal.  Abdominal:     General: There is no distension.  Musculoskeletal:        General: Swelling present. No deformity.     Right lower leg: Edema present.     Left lower leg: Edema present.     Right ankle: Swelling present.     Left ankle: Swelling present.     Comments:  Swelling at both ankles extending to the feet. Mild tenderness to palpation of both heels and medial malleolus of right ankle. No ligamentous laxity or varus/valgus instability. 5/5 strength of BLE major muscle groups including with plantarflexion, dorsiflexion, inversion, and eversion of both feet at the ankles.   Skin:    General: Skin is warm and dry.     Capillary Refill: Capillary refill takes less than 2 seconds.     Findings: No erythema.  Neurological:     General: No focal deficit present.     Mental Status: He is alert.     Sensory: No sensory deficit.     Comments: Sensation intact to light touch of bilateral lower extremities.  Patient ambulatory with mildly antalgic gait but is able to heel walk and toe walk without difficulty.  Psychiatric:        Behavior: Behavior normal.     ED Results / Procedures / Treatments   Labs (all labs ordered are listed, but only abnormal results are displayed) Labs Reviewed - No data to display  EKG None  Radiology DG Ankle Complete Right  Result Date: 07/03/2019 CLINICAL DATA:  Ankle injury.  Pain EXAM: RIGHT ANKLE - COMPLETE 3+ VIEW COMPARISON:  None. FINDINGS: There is no evidence of fracture, dislocation, or joint effusion. There is no evidence of arthropathy or other focal bone abnormality. Soft tissues are unremarkable. IMPRESSION: Negative. Electronically Signed   By: Marlan Palauharles  Clark M.D.   On: 07/03/2019 16:38    Procedures Procedures (including critical care time)  Medications Ordered in ED Medications  ibuprofen (ADVIL) tablet 600 mg (has no administration in time range)    ED Course  I have reviewed the triage vital signs and the nursing notes.  Pertinent labs & imaging results that were available during my care of the patient were reviewed by me and considered in my medical decision making (see chart for details).    MDM Rules/Calculators/A&P                      Patient presenting for evaluation of ongoing lower  extremity edema.  Reports it has not changed since he was seen yesterday.  He is afebrile, vital signs are stable.  He is nontoxic in appearance.  He is neurovascularly intact.  Radiographs yesterday showed no evidence of acute osseous abnormality.  He is ambulatory despite pain.  No signs of secondary skin infection.  Doubt DVT, CHF, compartment syndrome.  Discussed conservative therapy and management with Ace wrap, elevation, ice, NSAIDs/Tylenol.  Recommend follow-up with Lavinia SharpsMary Ann Placey for  reevaluation but also discussed the importance of follow-up with the infectious disease specialist for management of his HIV.  It appears this was initially diagnosed in June of this year.  Labs from 02/14/2019 show normal CD4 count but he has not established care with an infectious disease doctor formally and he is not currently on any antiretrovirals.  Will provide with resources for follow-up.  Discussed strict ED return precautions. Patient verbalized understanding of and agreement with plan and is safe for discharge home at this time.    Final Clinical Impression(s) / ED Diagnoses Final diagnoses:  Bilateral lower extremity edema    Rx / DC Orders ED Discharge Orders    None       Renita Papa, PA-C 07/04/19 1962    Orpah Greek, MD 07/05/19 [phone]

## 2019-07-04 NOTE — ED Triage Notes (Signed)
States he is homeless and when he walks for long periods his feel swell. States he couldn't  Stand it any longer.

## 2019-07-06 DIAGNOSIS — M25571 Pain in right ankle and joints of right foot: Secondary | ICD-10-CM | POA: Insufficient documentation

## 2019-07-06 DIAGNOSIS — Z21 Asymptomatic human immunodeficiency virus [HIV] infection status: Secondary | ICD-10-CM | POA: Insufficient documentation

## 2019-07-06 DIAGNOSIS — F17210 Nicotine dependence, cigarettes, uncomplicated: Secondary | ICD-10-CM | POA: Insufficient documentation

## 2019-07-06 NOTE — ED Notes (Signed)
Noticed liqour bottle in inside pocket of coat

## 2019-07-07 ENCOUNTER — Emergency Department (HOSPITAL_COMMUNITY)
Admission: EM | Admit: 2019-07-07 | Discharge: 2019-07-07 | Disposition: A | Discharge status: Home / Self Care | Payer: Self-pay | Attending: Emergency Medicine | Admitting: Emergency Medicine

## 2019-07-07 ENCOUNTER — Encounter (HOSPITAL_COMMUNITY): Payer: Self-pay

## 2019-07-07 ENCOUNTER — Other Ambulatory Visit: Payer: Self-pay

## 2019-07-07 DIAGNOSIS — M25571 Pain in right ankle and joints of right foot: Secondary | ICD-10-CM

## 2019-07-07 MED ORDER — ACETAMINOPHEN 500 MG PO TABS
1000.0000 mg | ORAL_TABLET | Freq: Once | ORAL | Status: AC
  Administered 2019-07-07: 02:00:00 1000 mg via ORAL
  Filled 2019-07-07: qty 2

## 2019-07-07 NOTE — ED Triage Notes (Signed)
Pt presents with right ankle pain that started earlier today. Pt is ambulatory for a steady gait.

## 2019-07-07 NOTE — ED Provider Notes (Signed)
[facility] DEPT Provider Note   CSN: [id number] Arrival date & time: 07/06/19  2353     History Chief Complaint  Patient presents with  . Ankle Pain    [name] is a 33 y.o. male with a history of HIV, syphilis, methamphetamine abuse who presents to the emergency department with a chief complaint of ankle pain.  The patient reports that he has been having right ankle pain for the last few days.  This is his third ER visit for the same pain in the last 4 days.  Per chart review, the patient sprained his ankle while walking 5 days ago.  He has a history of homelessness and walks long distances.  He denies any new falls or injuries.  He was given an ASO brace on 12/16, which he is not wearing today.  Reports he has been taking ibuprofen for his pain.  He denies fevers, chills, chest pain, shortness of breath, numbness, weakness, redness, or warmth to the area.  No other treatment prior to arrival.  The history is provided by the patient. No language interpreter was used.       Past Medical History:  Diagnosis Date  . ADHD (attention deficit hyperactivity disorder)   . Drug abuse ([place])    meth, marijuana    Patient Active Problem List   Diagnosis Date Noted  . HIV (human immunodeficiency virus infection) ([place]) 06/05/2019    History reviewed. No pertinent surgical history.     Family History  Problem Relation Age of Onset  . Diabetes Mother   . Hypertension Mother   . Cancer Mother   . Cancer Father   . Diabetes Father   . Hypertension Father     Social History   Tobacco Use  . Smoking status: Current Every Day Smoker    Packs/day: 1.00    Years: 10.00    Pack years: 10.00    Types: Cigarettes  . Smokeless tobacco: Never Used  Substance Use Topics  . Alcohol use: Yes  . Drug use: Yes    Types: Marijuana, Methamphetamines    Comment: last used 4 days ago    Home Medications Prior to Admission medications   Medication Sig  Start Date End Date Taking? Authorizing Provider  ibuprofen (ADVIL) 600 MG tablet Take 1 tablet (600 mg total) by mouth every 6 (six) hours as needed. 07/03/19   Domenic Moras, PA-C  omeprazole (PRILOSEC) 20 MG capsule Take 1 capsule (20 mg total) by mouth daily. Patient not taking: Reported on 02/14/2019 06/24/18 02/14/19  Montine Circle, PA-C    Allergies    Patient has no known allergies.  Review of Systems   Review of Systems  Constitutional: Negative for activity change, chills and fever.  Respiratory: Negative for cough, shortness of breath and wheezing.   Cardiovascular: Negative for chest pain.  Gastrointestinal: Negative for abdominal pain, diarrhea, nausea and vomiting.  Musculoskeletal: Positive for arthralgias and myalgias. Negative for back pain, gait problem, joint swelling, neck pain and neck stiffness.  Skin: Negative for rash.  Neurological: Negative for syncope, weakness and numbness.    Physical Exam Updated Vital Signs BP 132/80 (BP Location: Left Arm)   Pulse 91   Temp 97.9 F (36.6 C) (Oral)   Resp 16   SpO2 100%   Physical Exam Vitals and nursing note reviewed.  Constitutional:      Appearance: He is well-developed.  HENT:     Head: Normocephalic.  Eyes:  Conjunctiva/sclera: Conjunctivae normal.  Cardiovascular:     Rate and Rhythm: Normal rate and regular rhythm.     Heart sounds: No murmur.  Pulmonary:     Effort: Pulmonary effort is normal.  Abdominal:     General: There is no distension.     Palpations: Abdomen is soft.  Musculoskeletal:     Cervical back: Neck supple.     Comments: Ambulates with a steady gait.  Able to bear weight on the bilateral lower extremities without difficulty.  No focal tenderness to the medial or lateral malleolus.  Achilles tendon is nontender and is intact.  Full active and passive range of motion of the right ankle.  No tenderness to the right lower leg or foot.  Good capillary refill.  5-5 strength against  resistance of the bilateral lower extremities.  Sensation is intact and equal throughout.  No edema, redness, or warmth noted to the joint.  Skin:    General: Skin is warm and dry.  Neurological:     Mental Status: He is alert.  Psychiatric:        Behavior: Behavior normal.     ED Results / Procedures / Treatments   Labs (all labs ordered are listed, but only abnormal results are displayed) Labs Reviewed - No data to display  EKG None  Radiology No results found.  Procedures Procedures (including critical care time)  Medications Ordered in ED Medications  acetaminophen (TYLENOL) tablet 1,000 mg (has no administration in time range)    ED Course  I have reviewed the triage vital signs and the nursing notes.  Pertinent labs & imaging results that were available during my care of the patient were reviewed by me and considered in my medical decision making (see chart for details).    MDM Rules/Calculators/A&P                      33 year old male with a history of HIV, methamphetamine use, syphilis who suffers from homelessness presents for his third evaluation of right ankle pain in the last 4 days. He had a negative x-ray of the right ankle on 1216 that was negative and denies any falls or injuries since this time.  Physical exam is unremarkable, and he is ambulating in the department without difficulty.  He has no infectious symptoms, and I have a very low suspicion for septic joint or gout.  Doubt occult fracture.  He has been given a dose of Tylenol in the ER, and I have advised him to follow-up with Clerance Lav at the [facility].  RICE therapy recommended. He is hemodynamically stable and in no acute distress.  Safe for discharge to home with outpatient follow-up as needed.  Final Clinical Impression(s) / ED Diagnoses Final diagnoses:  Acute right ankle pain    Rx / DC Orders ED Discharge Orders    None       Barkley Boards, PA-C 07/07/19 0135    Shon Baton,  MD 07/07/19 [phone]

## 2019-07-07 NOTE — Discharge Instructions (Addendum)
Thank you for allowing me to care for you today in the Emergency Department.   You can take 650 mg of Tylenol once every 6 hours for pain.  You should continue to wear the brace that you were given in the ER earlier this week.  Follow-up with the health clinic at the [facility] if you continue to have pain.  Return to the emergency department if you have any fall or injury, new numbness or weakness, if your toes turn blue, if you develop significant swelling, redness to the ankle, or if you develop fever and chills, or other new, concerning symptoms.

## 2019-07-09 ENCOUNTER — Observation Stay (HOSPITAL_COMMUNITY)
Admission: RE | Admit: 2019-07-09 | Discharge: 2019-07-10 | Disposition: A | Discharge status: Home / Self Care | Payer: Self-pay | Attending: Psychiatry | Admitting: Psychiatry

## 2019-07-09 ENCOUNTER — Encounter (HOSPITAL_COMMUNITY): Payer: Self-pay | Admitting: Psychiatry

## 2019-07-09 ENCOUNTER — Other Ambulatory Visit: Payer: Self-pay

## 2019-07-09 DIAGNOSIS — F1594 Other stimulant use, unspecified with stimulant-induced mood disorder: Principal | ICD-10-CM

## 2019-07-09 DIAGNOSIS — F1520 Other stimulant dependence, uncomplicated: Secondary | ICD-10-CM

## 2019-07-09 DIAGNOSIS — F17210 Nicotine dependence, cigarettes, uncomplicated: Secondary | ICD-10-CM | POA: Insufficient documentation

## 2019-07-09 DIAGNOSIS — F332 Major depressive disorder, recurrent severe without psychotic features: Secondary | ICD-10-CM | POA: Diagnosis present

## 2019-07-09 DIAGNOSIS — Z590 Homelessness: Secondary | ICD-10-CM | POA: Insufficient documentation

## 2019-07-09 DIAGNOSIS — Z20828 Contact with and (suspected) exposure to other viral communicable diseases: Secondary | ICD-10-CM | POA: Insufficient documentation

## 2019-07-09 LAB — RESPIRATORY PANEL BY RT PCR (FLU A&B, COVID)
Influenza A by PCR: NEGATIVE
Influenza B by PCR: NEGATIVE
SARS Coronavirus 2 by RT PCR: NEGATIVE

## 2019-07-09 MED ORDER — ACETAMINOPHEN 325 MG PO TABS: 650.0000 mg | ORAL_TABLET | Freq: Four times a day (QID) | ORAL | Status: DC | PRN

## 2019-07-09 MED ORDER — HYDROXYZINE HCL 25 MG PO TABS: 25.0000 mg | ORAL_TABLET | Freq: Three times a day (TID) | ORAL | Status: DC | PRN

## 2019-07-09 MED ORDER — ALUM & MAG HYDROXIDE-SIMETH 200-200-20 MG/5ML PO SUSP: 30.0000 mL | ORAL | Status: DC | PRN

## 2019-07-09 MED ORDER — TRAZODONE HCL 50 MG PO TABS: 50.0000 mg | ORAL_TABLET | Freq: Every evening | ORAL | Status: DC | PRN

## 2019-07-09 MED ORDER — MAGNESIUM HYDROXIDE 400 MG/5ML PO SUSP: 30.0000 mL | Freq: Every day | ORAL | Status: DC | PRN

## 2019-07-09 NOTE — Progress Notes (Signed)
Patient ID: [name], male   DOB: May 04, 1986, 33 y.o.   MRN: [id number] Pt A&O x 4, is homeless presents with SI, plan to cut his wrists.  Previous attempt noted.  Denies HI or AVH.  Skin search completed.  Monitoring for safety.

## 2019-07-09 NOTE — BH Assessment (Signed)
Assessment Note  [name] is an 33 y.o. male.  -Patient is a walk-in for [facility] services.  He arrived on foot and is unaccompanied.  Of note, patient says he has been homeless in this area for the last 10 years.  Review of his record does show one previous TTS assessment on 03-30-18.  Patient presents tonight with thoughts of suicide.  He says he had thought of  "slicing my wrists."  He says he has attempted that before, when he was younger.  Patient says he has had multiple suicide attempts.  Pt denies any HI or A/V hallucinations.  He does admit to daily smoking about a gram of methamphetamine.  This has been an ongoing problem for him.  He says his last use was yesterday.  Patient has been homeless in this area for the last 10 years.  He has no contact with any family members.  When asked about his parents he shakes his head and says nothing.    Patient has fair eye contact.  He is oriented x3.  Pt is pleasant.  His speech is normal and thought process appears logical and coherent.  No evidence of responding to internal stimuli.  Patient says he sleeps <4H/D and his appetite is affected by his meth use, as is his sleep.  Pt is cooperative through assessment.  Pt says he has not been inpatient in this area despite his saying he has had multiple suicide attempts.  Pt has no current outpatient provider.  -Clinician discussed patient with Renaye Rakers, NP who did the MSE.  She recommended [facility] observation unit and psychiatry in AM.  Pt to be administered COVID test.  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 ADHD (attention deficit hyperactivity disorder)   . Drug abuse (HCC)    meth, marijuana    No past surgical history on file.  Family History:  Family History  Problem Relation Age of Onset  . Diabetes Mother   . Hypertension Mother   . Cancer Mother   . Cancer Father    . Diabetes Father   . Hypertension Father     Social History:  reports that he has been smoking cigarettes. He has a 10.00 pack-year smoking history. He has never used smokeless tobacco. He reports current alcohol use. He reports current drug use. Drugs: Marijuana and Methamphetamines.  Additional Social History:  Alcohol / Drug Use Pain Medications: None Prescriptions: None Over the Counter: None History of alcohol / drug use?: Yes Substance #1 Name of Substance 1: Methamphetamine (smoking) 1 - Age of First Use: 33 years of age 8 - Amount (size/oz): "About a gram a day: 1 - Frequency: Daily 1 - Duration: Over a month at this rate. 1 - Last Use / Amount: 12/21 (usual amount)  CIWA:   COWS:    Allergies: No Known Allergies  Home Medications: (Not in a hospital admission)   OB/GYN Status:  No LMP for male patient.  General Assessment Data [facility] of Assessment: [facility] Services TTS Assessment: In system Is this a Tele or Face-to-Face Assessment?: Face-to-Face Is this an Initial Assessment or a Re-assessment for this encounter?: Initial Assessment Patient Accompanied by:: N/A Language Other than English: No Living Arrangements: Homeless/Shelter What gender do you identify as?: Male Marital status: Single Pregnancy Status: No Living Arrangements: Other (Comment)(Pt is currently homeless) Can pt return to current living arrangement?: Yes Admission  Status: Voluntary Is patient capable of signing voluntary admission?: Yes Referral Source: Self/Family/Friend Insurance type: self pay  Medical Screening Exam ([place]) Medical Exam completed: Shona Simpson, NP)  Crisis Care Plan Living Arrangements: Other (Comment)(Pt is currently homeless) Name of Psychiatrist: None Name of Therapist: None`  Education Status Is patient currently in school?: No Is the patient employed, unemployed or receiving disability?: Unemployed  Risk to self with the past 6  months Suicidal Ideation: Yes-Currently Present Has patient been a risk to self within the past 6 months prior to admission? : Yes Suicidal Intent: Yes-Currently Present Has patient had any suicidal intent within the past 6 months prior to admission? : No Is patient at risk for suicide?: Yes Suicidal Plan?: Yes-Currently Present Has patient had any suicidal plan within the past 6 months prior to admission? : Yes Specify Current Suicidal Plan: Slice my wrists Access to Means: Yes Specify Access to Suicidal Means: Sharps What has been your use of drugs/alcohol within the last 12 months?: Meth Previous Attempts/Gestures: Yes How many times?: (Mutliple\) Other Self Harm Risks: None Triggers for Past Attempts: Other (Comment)(Homelessness) Intentional Self Injurious Behavior: None Family Suicide History: No Recent stressful life event(s): Turmoil (Comment)(Homelessness) Persecutory voices/beliefs?: Yes Depression: Yes Depression Symptoms: Despondent, Isolating, Loss of interest in usual pleasures, Feeling worthless/self pity Substance abuse history and/or treatment for substance abuse?: Yes Suicide prevention information given to non-admitted patients: Not applicable  Risk to Others within the past 6 months Homicidal Ideation: No Does patient have any lifetime risk of violence toward others beyond the six months prior to admission? : No Thoughts of Harm to Others: No Current Homicidal Intent: No Current Homicidal Plan: No Access to Homicidal Means: No Identified Victim: None History of harm to others?: No Assessment of Violence: None Noted Violent Behavior Description: None reported Does patient have access to weapons?: No Criminal Charges Pending?: No Does patient have a court date: No Is patient on probation?: No  Psychosis Hallucinations: None noted Delusions: None noted  Mental Status Report Appearance/Hygiene: Disheveled Eye Contact: Fair Motor Activity: Freedom of  movement, Unremarkable Speech: Logical/coherent, Soft Level of Consciousness: Alert Mood: Depressed, Helpless, Sad Affect: Appropriate to circumstance, Sad Anxiety Level: Minimal Thought Processes: Coherent, Relevant Judgement: Unable to Assess Orientation: Person, Place, Situation Obsessive Compulsive Thoughts/Behaviors: None  Cognitive Functioning Concentration: Fair Memory: Recent Impaired, Remote Intact Is patient IDD: No Insight: Good Impulse Control: Fair Appetite: Fair Have you had any weight changes? : No Change(Uses meth so appetite dependent on drug use.) Sleep: Decreased Total Hours of Sleep: (<4H/D depending on drug use.) Vegetative Symptoms: Decreased grooming  ADLScreening [facility] Assessment Services) Patient's cognitive ability adequate to safely complete daily activities?: Yes Patient able to express need for assistance with ADLs?: Yes Independently performs ADLs?: Yes (appropriate for developmental age)  Prior Inpatient Therapy Prior Inpatient Therapy: Yes Prior Therapy Dates: 2019 Prior Therapy Facilty/Provider(s): [facility] Reason for Treatment: SI  Prior Outpatient Therapy Prior Outpatient Therapy: No Does patient have an ACCT team?: No Does patient have Intensive In-House Services?  : No Does patient have [facility] services? : No Does patient have P4CC services?: No  ADL Screening (condition at time of admission) Patient's cognitive ability adequate to safely complete daily activities?: Yes Is the patient deaf or have difficulty hearing?: No Does the patient have difficulty seeing, even when wearing glasses/contacts?: No Does the patient have difficulty concentrating, remembering, or making decisions?: Yes Patient able to express need for assistance with ADLs?: Yes Does the patient have  difficulty dressing or bathing?: No Independently performs ADLs?: Yes (appropriate for developmental age) Does the patient have difficulty walking or climbing stairs?:  No Weakness of Legs: None Weakness of Arms/Hands: None       Abuse/Neglect Assessment (Assessment to be complete while patient is alone) Abuse/Neglect Assessment Can Be Completed: Yes Physical Abuse: Denies Verbal Abuse: Denies Sexual Abuse: Denies Exploitation of patient/patient's resources: Denies     Merchant navy officerAdvance Directives (For Healthcare) Does Patient Have a Medical Advance Directive?: No Would patient like information on creating a medical advance directive?: No - Patient declined          Disposition:  Disposition Initial Assessment Completed for this Encounter: Yes Disposition of Patient: Admit(OBS unit) Type of inpatient treatment program: Adult(OBS unit) Patient refused recommended treatment: No Mode of transportation if patient is discharged/movement?: N/A Patient referred to: Other (Comment)(Psychiatry to reveiw tomorrow.)  On Site Evaluation by:   Reviewed with Physician:    Beatriz StallionHarvey, Aquil Duhe Ray 07/09/2019 10:04 PM

## 2019-07-09 NOTE — H&P (Signed)
Psychiatric Admission Assessment Adult  Patient Identification: [name] MRN:  [id number] Date of Evaluation:  07/10/2019 Chief Complaint: Suicidal Principal Diagnosis: MDD (major depressive disorder), recurrent severe, without psychosis ([place]) Diagnosis:  Principal Problem:   MDD (major depressive disorder), recurrent severe, without psychosis ([place])  History of Present Illness:   [name] is an 33 y.o. male who presents voluntarily to [facility] with complaints of suicidal ideation. Pt reports he has been homeless for 10 years. States he has been increasingly depressed for a week and had thoughts of suicide by "slicing my wrist" today. Pt reports he has had multiple suicidal attempts. 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 Pt denies HI and AVH. He denies any family support. Denies any pending legal charges. Denies access to guns to weapons. States he has no therapist or psychiatrist; no psychiatric inpatient hospitalization or medication use.  During evaluation pt is sitting; he is alert/oriented x 2; cooperative; and mood is depressed and anxiouscongruent with affect.  Patient is speaking in a clear tone at moderate volume, and normal pace; with fair eye contact. His thought process is coherent and relevant; There is no indication that he is currently responding to internal/external stimuli or experiencing delusional thought content. Judgement, insight and impulse control is fair at this time.   For detailed note see TTS assessment note  Associated Signs/Symptoms: Depression Symptoms:  depressed mood, feelings of worthlessness/guilt, hopelessness, suicidal thoughts with specific plan, (Hypo) Manic Symptoms:  NA Anxiety Symptoms:  Excessive Worry, Psychotic Symptoms:  NA PTSD Symptoms: NA Total Time spent with patient: 30 minutes  Past Psychiatric History: Yes  Is the patient at risk to self? Yes.    Has the patient been a risk to self in the past 6  months? Yes.    Has the patient been a risk to self within the distant past? No.  Is the patient a risk to others? No.  Has the patient been a risk to others in the past 6 months? No.  Has the patient been a risk to others within the distant past? No.   Prior Inpatient Therapy: Prior Inpatient Therapy: Yes Prior Therapy Dates: 2019 Prior Therapy Facilty/Provider(s): [facility] Reason for Treatment: SI Prior Outpatient Therapy: Prior Outpatient Therapy: No Does patient have an ACCT team?: No Does patient have Intensive In-House Services?  : No Does patient have [facility] services? : No Does patient have P4CC services?: No  Alcohol Screening: 1. How often do you have a drink containing alcohol?: Never 2. How many drinks containing alcohol do you have on a typical day when you are drinking?: 1 or 2 3. How often do you have six or more drinks on one occasion?: Never AUDIT-C Score: 0 4. How often during the last year have you found that you were not able to stop drinking once you had started?: Never 5. How often during the last year have you failed to do what was normally expected from you becasue of drinking?: Never 6. How often during the last year have you needed a first drink in the morning to get yourself going after a heavy drinking session?: Never 7. How often during the last year have you had a feeling of guilt of remorse after drinking?: Never 8. How often during the last year have you been unable to remember what happened the night before because you had been drinking?: Never 9. Have you or someone else been injured as a result of your drinking?: No 10.  Has a relative or friend or a doctor or another health worker been concerned about your drinking or suggested you cut down?: No Alcohol Use Disorder Identification Test Final Score (AUDIT): 0 Alcohol Brief Interventions/Follow-up: AUDIT Score <7 follow-up not indicated Substance Abuse History in the last 12 months:  Yes.   Consequences of  Substance Abuse: Homelessness Previous Psychotropic Medications: No  Psychological Evaluations: Yes  Past Medical History:  Past Medical History:  Diagnosis Date  . ADHD (attention deficit hyperactivity disorder)   . Drug abuse (HCC)    meth, marijuana   History reviewed. No pertinent surgical history. Family History:  Family History  Problem Relation Age of Onset  . Diabetes Mother   . Hypertension Mother   . Cancer Mother   . Cancer Father   . Diabetes Father   . Hypertension Father    Family Psychiatric  History: Unknown Tobacco Screening:   Social History:  Social History   Substance and Sexual Activity  Alcohol Use Yes     Social History   Substance and Sexual Activity  Drug Use Yes  . Types: Marijuana, Methamphetamines   Comment: last used 4 days ago    Additional Social History: Marital status: Single    Pain Medications: None Prescriptions: None Over the Counter: None History of alcohol / drug use?: Yes Name of Substance 1: Methamphetamine (smoking) 1 - Age of First Use: 33 years of age 35 - Amount (size/oz): "About a gram a day: 1 - Frequency: Daily 1 - Duration: Over a month at this rate. 1 - Last Use / Amount: 12/21 (usual amount)                  Allergies:  No Known Allergies Lab Results:  Results for orders placed or performed during the hospital encounter of 07/09/19 (from the past 48 hour(s))  Respiratory Panel by RT PCR (Flu A&B, Covid) - Nasopharyngeal Swab     Status: None   Collection Time: 07/09/19 10:05 PM   Specimen: Nasopharyngeal Swab  Result Value Ref Range   SARS Coronavirus 2 by RT PCR NEGATIVE NEGATIVE    Comment: (NOTE) SARS-CoV-2 target nucleic acids are NOT DETECTED. The SARS-CoV-2 RNA is generally detectable in upper respiratoy specimens during the acute phase of infection. The lowest concentration of SARS-CoV-2 viral copies this assay can detect is 131 copies/mL. A negative result does not preclude  SARS-Cov-2 infection and should not be used as the sole basis for treatment or other patient management decisions. A negative result may occur with  improper specimen collection/handling, submission of specimen other than nasopharyngeal swab, presence of viral mutation(s) within the areas targeted by this assay, and inadequate number of viral copies (<131 copies/mL). A negative result must be combined with clinical observations, patient history, and epidemiological information. The expected result is Negative. Fact Sheet for Patients:  https://www.moore.com/ Fact Sheet for Healthcare Providers:  https://www.young.biz/ This test is not yet ap proved or cleared by the [place] FDA and  has been authorized for detection and/or diagnosis of SARS-CoV-2 by FDA under an Emergency Use Authorization (EUA). This EUA will remain  in effect (meaning this test can be used) for the duration of the COVID-19 declaration under Section 564(b)(1) of the Act, 21 U.S.C. section 360bbb-3(b)(1), unless the authorization is terminated or revoked sooner.    Influenza A by PCR NEGATIVE NEGATIVE   Influenza B by PCR NEGATIVE NEGATIVE    Comment: (NOTE) The Xpert Xpress SARS-CoV-2/FLU/RSV assay is intended as an aid  in  the diagnosis of influenza from Nasopharyngeal swab specimens and  should not be used as a sole basis for treatment. Nasal washings and  aspirates are unacceptable for Xpert Xpress SARS-CoV-2/FLU/RSV  testing. Fact Sheet for Patients: https://www.moore.com/ Fact Sheet for Healthcare Providers: https://www.young.biz/ This test is not yet approved or cleared by the [place] FDA and  has been authorized for detection and/or diagnosis of SARS-CoV-2 by  FDA under an Emergency Use Authorization (EUA). This EUA will remain  in effect (meaning this test can be used) for the duration of the  Covid-19 declaration  under Section 564(b)(1) of the Act, 21  U.S.C. section 360bbb-3(b)(1), unless the authorization is  terminated or revoked. Performed at [facility], [address] [place], [place] [address]     Blood Alcohol level:  Lab Results  Component Value Date   ETH (H) 12/08/2009    11        LOWEST DETECTABLE LIMIT FOR SERUM ALCOHOL IS 5 mg/dL FOR MEDICAL PURPOSES ONLY   ETH (H) 11/19/2009    155        LOWEST DETECTABLE LIMIT FOR SERUM ALCOHOL IS 5 mg/dL FOR MEDICAL PURPOSES ONLY    Metabolic Disorder Labs:  No results found for: HGBA1C, MPG No results found for: PROLACTIN No results found for: CHOL, TRIG, HDL, CHOLHDL, VLDL, LDLCALC  Current Medications: Current Facility-Administered Medications  Medication Dose Route Frequency Provider Last Rate Last Admin  . acetaminophen (TYLENOL) tablet 650 mg  650 mg Oral Q6H PRN Eddi Hymes C, NP      . alum & mag hydroxide-simeth (MAALOX/MYLANTA) 200-200-20 MG/5ML suspension 30 mL  30 mL Oral Q4H PRN Bruin Bolger C, NP      . hydrOXYzine (ATARAX/VISTARIL) tablet 25 mg  25 mg Oral TID PRN Love Milbourne C, NP      . magnesium hydroxide (MILK OF MAGNESIA) suspension 30 mL  30 mL Oral Daily PRN Tabor Bartram C, NP      . traZODone (DESYREL) tablet 50 mg  50 mg Oral QHS PRN Sheyna Pettibone C, NP       PTA Medications: Medications Prior to Admission  Medication Sig Dispense Refill Last Dose  . diclofenac (VOLTAREN) 75 MG EC tablet Take 1 tablet every 12 hours with food as needed for pain or inflammation   Unknown at Unknown time  . ibuprofen (ADVIL) 600 MG tablet Take 1 tablet (600 mg total) by mouth every 6 (six) hours as needed. 30 tablet 0 Unknown at Unknown time  . polyethylene glycol powder (GLYCOLAX/MIRALAX) 17 GM/SCOOP powder Take by mouth.   Unknown at Unknown time    Musculoskeletal: Strength & Muscle Tone: within normal limits Gait & Station: normal Patient leans: N/A  Psychiatric Specialty Exam: Physical Exam   Constitutional: He is oriented to person, place, and time. He appears well-developed.  HENT:  Head: Normocephalic.  Eyes: Pupils are equal, round, and reactive to light.  Respiratory: Effort normal.  Musculoskeletal:        General: Normal range of motion.     Cervical back: Normal range of motion.  Neurological: He is alert and oriented to person, place, and time.  Skin: Skin is warm and dry.  Psychiatric: His speech is normal and behavior is normal. Judgment normal. His mood appears anxious. Cognition and memory are normal. He exhibits a depressed mood. He expresses suicidal ideation.    Review of Systems  Psychiatric/Behavioral: Positive for suicidal ideas. Negative for agitation, behavioral problems, confusion and  hallucinations. The patient is nervous/anxious.   All other systems reviewed and are negative.   Blood pressure (!) 140/91, pulse (!) 102, temperature 99 F (37.2 C), temperature source Oral, resp. rate 18, height 6\' 1"  (1.854 m), weight 85.3 kg, SpO2 100 %.Body mass index is 24.8 kg/m.  General Appearance: Casual  Eye Contact:  Fair  Speech:  Normal Rate  Volume:  Normal  Mood:  Anxious, Depressed, Hopeless and Worthless  Affect:  Congruent and Depressed  Thought Process:  Coherent and Descriptions of Associations: Intact  Orientation:  Other:  Person and place  Thought Content:  WDL  Suicidal Thoughts:  Yes.  with intent/plan  Homicidal Thoughts:  No  Memory:  Recent;   Fair  Judgement:  Fair  Insight:  Fair  Psychomotor Activity:  Normal  Concentration:  Concentration: Fair  Recall:  Fair  Fund of Knowledge:  Good  Language:  Good  Akathisia:  No  Handed:  Right  AIMS (if indicated):     Assets:  Communication Skills  ADL's:  Intact  Cognition:  WNL  Sleep:       Treatment Plan Summary: Daily contact with patient to assess and evaluate symptoms and progress in treatment and Medication management  Observation Level/Precautions:  15 minute checks   Laboratory:  Chemistry Profile HbAIC UDS  Psychotherapy:    Medications:    Consultations:    Discharge Concerns:    Estimated LOS:  Other:     Physician Treatment Plan for Primary Diagnosis: MDD (major depressive disorder), recurrent severe, without psychosis (HCC) Long Term Goal(s): Improvement in symptoms so as ready for discharge  Short Term Goals: Ability to identify changes in lifestyle to reduce recurrence of condition will improve, Ability to demonstrate self-control will improve, Ability to identify and develop effective coping behaviors will improve, Ability to maintain clinical measurements within normal limits will improve and Ability to identify triggers associated with substance abuse/mental health issues will improve  Physician Treatment Plan for Secondary Diagnosis: Principal Problem:   MDD (major depressive disorder), recurrent severe, without psychosis (HCC)  Long Term Goal(s): Improvement in symptoms so as ready for discharge  Short Term Goals: Ability to identify changes in lifestyle to reduce recurrence of condition will improve, Ability to demonstrate self-control will improve, Ability to identify and develop effective coping behaviors will improve, Ability to maintain clinical measurements within normal limits will improve and Ability to identify triggers associated with substance abuse/mental health issues will improve  I certify that inpatient services furnished can reasonably be expected to improve the patient's condition.    Wandra ArthursAdaku C Shaye Lagace, NP 12/23/202012:46 AM

## 2019-07-09 NOTE — Plan of Care (Signed)
[facility] Observation Crisis Plan  Reason for Crisis Plan:  Crisis Stabilization   Plan of Care:  Referral for Inpatient Hospitalization  Family Support:      Current Living Environment:  Living Arrangements: Other (Comment)(Pt is currently homeless)  Insurance:   Hospital Account    Name Acct ID Class Status Primary Coverage   [name], [name] [id number] [place] Open None        Guarantor Account (for Hospital Account [id number])    Name Relation to Pt Service Area Active? Acct Type   [name], [name] Yes Behavioral Health   Address Phone       [place], [place] [address] [phone])          Coverage Information (for Hospital Account [id number])    Not on file      Legal Guardian:     Primary Care Provider:  Patient, No Pcp Per  Current Outpatient Providers:  None  Psychiatrist:  Name of Psychiatrist: None  Counselor/Therapist:  Name of Therapist: None`  Compliant with Medications:  No  Additional Information:   [name] 12/22/202011:30 PM

## 2019-07-10 ENCOUNTER — Encounter: Payer: Self-pay | Admitting: Family

## 2019-07-10 ENCOUNTER — Encounter (HOSPITAL_COMMUNITY): Payer: Self-pay | Admitting: Behavioral Health

## 2019-07-10 ENCOUNTER — Ambulatory Visit (HOSPITAL_COMMUNITY): Payer: Self-pay

## 2019-07-10 DIAGNOSIS — F332 Major depressive disorder, recurrent severe without psychotic features: Secondary | ICD-10-CM

## 2019-07-10 DIAGNOSIS — F1594 Other stimulant use, unspecified with stimulant-induced mood disorder: Secondary | ICD-10-CM

## 2019-07-10 DIAGNOSIS — F1520 Other stimulant dependence, uncomplicated: Secondary | ICD-10-CM

## 2019-07-10 LAB — COMPREHENSIVE METABOLIC PANEL
ALT: 59 U/L — ABNORMAL HIGH (ref 0–44)
AST: 50 U/L — ABNORMAL HIGH (ref 15–41)
Albumin: 3.7 g/dL (ref 3.5–5.0)
Alkaline Phosphatase: 67 U/L (ref 38–126)
Anion gap: 8 (ref 5–15)
BUN: 14 mg/dL (ref 6–20)
CO2: 27 mmol/L (ref 22–32)
Calcium: 9 mg/dL (ref 8.9–10.3)
Chloride: 103 mmol/L (ref 98–111)
Creatinine, Ser: 0.83 mg/dL (ref 0.61–1.24)
GFR calc Af Amer: >60 mL/min (ref >60)
GFR calc non Af Amer: >60 mL/min (ref >60)
Glucose, Bld: 94 mg/dL (ref 70–99)
Potassium: 4 mmol/L (ref 3.5–5.1)
Sodium: 138 mmol/L (ref 135–145)
Total Bilirubin: 0.3 mg/dL (ref 0.3–1.2)
Total Protein: 7.7 g/dL (ref 6.5–8.1)

## 2019-07-10 LAB — LIPID PANEL
Cholesterol: 123 mg/dL (ref 0–200)
HDL: 28 mg/dL — ABNORMAL LOW (ref >40)
LDL Cholesterol: 64 mg/dL (ref 0–99)
Total CHOL/HDL Ratio: 4.4 RATIO
Triglycerides: 154 mg/dL — ABNORMAL HIGH (ref <150)
VLDL: 31 mg/dL (ref 0–40)

## 2019-07-10 LAB — ETHANOL: Alcohol, Ethyl (B): <10 mg/dL (ref <10)

## 2019-07-10 LAB — HEPATIC FUNCTION PANEL
ALT: 61 U/L — ABNORMAL HIGH (ref 0–44)
AST: 50 U/L — ABNORMAL HIGH (ref 15–41)
Albumin: 3.6 g/dL (ref 3.5–5.0)
Alkaline Phosphatase: 69 U/L (ref 38–126)
Bilirubin, Direct: 0.1 mg/dL (ref 0.0–0.2)
Indirect Bilirubin: 0.3 mg/dL (ref 0.3–0.9)
Total Bilirubin: 0.4 mg/dL (ref 0.3–1.2)
Total Protein: 7.6 g/dL (ref 6.5–8.1)

## 2019-07-10 LAB — HEMOGLOBIN A1C
Hgb A1c MFr Bld: 5 % (ref 4.8–5.6)
Mean Plasma Glucose: 96.8 mg/dL

## 2019-07-10 LAB — MAGNESIUM: Magnesium: 1.7 mg/dL (ref 1.7–2.4)

## 2019-07-10 LAB — TSH: TSH: 1.007 u[IU]/mL (ref 0.350–4.500)

## 2019-07-10 NOTE — Discharge Instructions (Signed)
For your behavioral health needs you are advised to follow up with [facility].  They offer psychiatry, therapy and substance abuse treatment.  New patients are seen at their walk-in clinic.  Walk-in hours are Monday - Friday from 8:30 am - 12:00 pm, and from 1:00 pm - 2:30 pm.  Walk-in patients are seen on a first come, first served basis, so try to arrive as early as possible for the best chance of being seen the same day: ° °     [facility] °     [address] °     [place], [place] [address] °     [phone] °

## 2019-07-10 NOTE — BH Assessment (Signed)
[facility] Assessment Progress Note  Per Shuvon Rankin, FNP, this pt does not require psychiatric hospitalization at this time.  Pt is to be discharged from [facility] with outpatient referrals.  Discharge instructions advise pt to follow up with [facility].  Pt would also benefit from seeing Peer Support Specialists; they will be asked to speak to pt.  Pt's nurse, Legrand Como, has been notified.  Jalene Mullet, [place] Triage Specialist [phone]

## 2019-07-10 NOTE — Progress Notes (Signed)
Patient ID: [name], male   DOB: 1986/07/14, 33 y.o.   MRN: [id number] Patient transferred from room Room 202 to Room 403-01 after Covid results came in negative.  Pt calm and cooperate with transfer process, V/S checked, oriented to the Obs unit, verbalizes understanding, Q15 minute checks continued.

## 2019-07-10 NOTE — Progress Notes (Signed)
Pt has been psychiatrically cleared. Transportation has been arranged for pt with [facility].   Audree Camel, LCSW, [place] Disposition [facility]/TTS [phone]

## 2019-07-10 NOTE — Progress Notes (Signed)
Discharge note  Patient verbalizes readiness for discharge. Follow up plan explained, AVS provided. Belongings returned and signed for. Suicide safety plan completed and signed. Patient verbalizes understanding. Patient denies SI/HI and assures this Probation officer he will seek assistance should that change. Patient discharged to lobby to wait for Lyft.

## 2019-07-10 NOTE — Patient Outreach (Signed)
CPSS met with the patient at the [facility] Observation Unit in order to provide substance use recovery support and provide help with getting connected to substance use recovery resources. Patient reports a history of daily methamphetamine use and is currently experiencing homelessness. Patient is interested in getting connected to residential substance use treatment or substance use recovery housing. CPSS talked to the patient about how [facility] is currently one of the only places who have residential substance use treatment center beds available today for patients who are uninsured. Patient is not as interested in [facility] due to a past admission at [facility] residential substance use treatment center, and the patient did not find their treatment program helpful for his substance use recovery process. CPSS talked to the patient about [facility] as another option for substance use recovery resource, and the patient is interested in getting connected to an [facility] today. CPSS provided the patient with an [employer] as well as [employer] [facility] resources. CPSS also provided additional substance use recovery resources including residential/outpatient substance use treatment center list, NA meeting list, and CPSS contact information. CPSS strongly encouraged the patient to follow up with CPSS if needed for further help with [facility]PSS substance use recovery outreach services.

## 2019-07-10 NOTE — Discharge Summary (Addendum)
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 Parke Poisson; and chart reviewed on 07/10/19.  On evaluation Ladavion Savitz reports he came to the hospital related to passive suicidal thoughts.  Reports he is feeling much better today.  Denies any suicidal thoughts at this time.  Patient reports that he uses crystal meth daily.  Patient also states that he is homeless at this time.  Patient reports that he is unemployed.  Patient also denies outpatient psychiatric services.  Patient does state that he is interested in rehab services but does not feel that he needs any assistance or help they could come from being admitted to a psychiatric hospital.  At this time patient denies suicidal/self-harm/homicidal ideations, psychosis, paranoia.  Discussed ordering peers support to assist with rehab services in which patient stated he was interested. During evaluation Mahkai Fangman is alert/oriented x 4; calm/cooperative; and mood is congruent with affect.  He does not appear to be responding to internal/external stimuli or delusional thoughts.  Patient denies suicidal/self-harm/homicidal ideation, psychosis, and paranoia.  Patient answered question appropriately.  Patient recently discharged from a psychiatric hospital less than a month ago did not follow-up with any other resources given for outpatient psychiatric services or substance use services.   Total Time spent with patient: 30 minutes  Past Psychiatric History: See above  Past Medical History:  Past Medical History:  Diagnosis Date  . ADHD  (attention deficit hyperactivity disorder)   . Drug abuse ([place])    meth, marijuana   History reviewed. No pertinent surgical history. Family History:  Family History  Problem Relation Age of Onset  . Diabetes Mother   . Hypertension Mother   . Cancer Mother   . Cancer Father   . Diabetes Father   . Hypertension Father    Family Psychiatric  History: Denies Social History:  Social History   Substance and Sexual Activity  Alcohol Use Yes     Social History   Substance and Sexual Activity  Drug Use Yes  . Types: Marijuana, Methamphetamines   Comment: last used 4 days ago    Social History   Socioeconomic History  . Marital status: Single    Spouse name: Not on file  . Number of children: Not on file  . Years of education: Not on file  . Highest education level: Not on file  Occupational History  . Not on file  Tobacco Use  . Smoking status: Current Every Day Smoker    Packs/day: 1.00    Years: 10.00    Pack years: 10.00    Types: Cigarettes  . Smokeless tobacco: Never Used  Substance and Sexual Activity  . Alcohol use: Yes  . Drug use: Yes    Types: Marijuana, Methamphetamines    Comment: last used 4 days ago  . Sexual activity: Yes    Birth control/protection: None  Other Topics Concern  . Not on file  Social History Narrative  . Not on file   Social Determinants of Health   Financial Resource Strain:   . Difficulty of Paying Living Expenses: Not on file  Food Insecurity:   . Worried About Charity fundraiser  in the Last Year: Not on file  . Ran Out of Food in the Last Year: Not on file  Transportation Needs:   . Lack of Transportation (Medical): Not on file  . Lack of Transportation (Non-Medical): Not on file  Physical Activity:   . Days of Exercise per Week: Not on file  . Minutes of Exercise per Session: Not on file  Stress:   . Feeling of Stress : Not on file  Social Connections:   . Frequency of Communication with Friends and Family: Not on  file  . Frequency of Social Gatherings with Friends and Family: Not on file  . Attends Religious Services: Not on file  . Active Member of Clubs or Organizations: Not on file  . Attends Banker Meetings: Not on file  . Marital Status: Not on file    Has this patient used any form of tobacco in the last 30 days? (Cigarettes, Smokeless Tobacco, Cigars, and/or Pipes) A prescription for an FDA-approved tobacco cessation medication was offered at discharge and the patient refused  Current Medications: Current Facility-Administered Medications  Medication Dose Route Frequency Provider Last Rate Last Admin  . acetaminophen (TYLENOL) tablet 650 mg  650 mg Oral Q6H PRN Anike, Adaku C, NP      . alum & mag hydroxide-simeth (MAALOX/MYLANTA) 200-200-20 MG/5ML suspension 30 mL  30 mL Oral Q4H PRN Anike, Adaku C, NP      . hydrOXYzine (ATARAX/VISTARIL) tablet 25 mg  25 mg Oral TID PRN Anike, Adaku C, NP      . magnesium hydroxide (MILK OF MAGNESIA) suspension 30 mL  30 mL Oral Daily PRN Anike, Adaku C, NP      . traZODone (DESYREL) tablet 50 mg  50 mg Oral QHS PRN Anike, Adaku C, NP       PTA Medications: Medications Prior to Admission  Medication Sig Dispense Refill Last Dose  . diclofenac (VOLTAREN) 75 MG EC tablet Take 1 tablet every 12 hours with food as needed for pain or inflammation   Unknown at Unknown time  . ibuprofen (ADVIL) 600 MG tablet Take 1 tablet (600 mg total) by mouth every 6 (six) hours as needed. 30 tablet 0 Unknown at Unknown time  . polyethylene glycol powder (GLYCOLAX/MIRALAX) 17 GM/SCOOP powder Take by mouth.   Unknown at Unknown time    Musculoskeletal: Strength & Muscle Tone: within normal limits Gait & Station: normal Patient leans: N/A  Psychiatric Specialty Exam: Physical Exam  ROS  Blood pressure (!) 140/91, pulse (!) 102, temperature 99 F (37.2 C), temperature source Oral, resp. rate 18, height 6\' 1"  (1.854 m), weight 85.3 kg, SpO2 100 %.Body mass  index is 24.8 kg/m.  General Appearance: Casual  Eye Contact:  Good  Speech:  Clear and Coherent and Normal Rate  Volume:  Normal  Mood:  "Good" appropriate.  Affect:  Appropriate and Congruent  Thought Process:  Coherent and Descriptions of Associations: Intact  Orientation:  Full (Time, Place, and Person)  Thought Content:  WDL  Suicidal Thoughts:  No  Homicidal Thoughts:  No  Memory:  Immediate;   Good Recent;   Good  Judgement:  Intact  Insight:  Present  Psychomotor Activity:  Normal  Concentration:  Concentration: Good and Attention Span: Good  Recall:  Good  Fund of Knowledge:  Fair  Language:  Good  Akathisia:  No  Handed:  Right  AIMS (if indicated):     Assets:  Communication Skills Desire for Improvement  ADL's:  Intact  Cognition:  WNL  Sleep:        Demographic Factors:  Male and Caucasian  Loss Factors: NA  Historical Factors: Impulsivity  Risk Reduction Factors:   Religious beliefs about death  Continued Clinical Symptoms:  Alcohol/Substance Abuse/Dependencies  Cognitive Features That Contribute To Risk:  None    Suicide Risk:  Minimal: No identifiable suicidal ideation.  Patients presenting with no risk factors but with morbid ruminations; may be classified as minimal risk based on the severity of the depressive symptoms    Plan Of Care/Follow-up recommendations:  Activity:  As tolerated Diet:  Heart healthy    Discharge Instructions     For your behavioral health needs you are advised to follow up with [facility].  They offer psychiatry, therapy and substance abuse treatment.  New patients are seen at their walk-in clinic.  Walk-in hours are Monday - Friday from 8:30 am - 12:00 pm, and from 1:00 pm - 2:30 pm.  Walk-in patients are seen on a first come, first served basis, so try to arrive as early as possible for the best chance of being seen the same day:       [facility]      [address]      [place], [place] [address]      [phone]    Disposition:  Patient psychiatrically cleared No evidence of imminent risk to self or others at present.   Patient does not meet criteria for psychiatric inpatient admission. Supportive therapy provided about ongoing stressors. Discussed crisis plan, support from social network, calling 911, coming to the Emergency Department, and calling Suicide Hotline.  Shuvon Rankin, NP 07/10/2019, 11:54 AM   Attest to NP Note

## 2019-07-13 ENCOUNTER — Other Ambulatory Visit: Payer: Self-pay

## 2019-07-13 ENCOUNTER — Emergency Department (HOSPITAL_COMMUNITY)
Admission: EM | Admit: 2019-07-13 | Discharge: 2019-07-14 | Disposition: A | Discharge status: Home / Self Care | Payer: Self-pay | Attending: Emergency Medicine | Admitting: Emergency Medicine

## 2019-07-13 DIAGNOSIS — R05 Cough: Secondary | ICD-10-CM | POA: Insufficient documentation

## 2019-07-13 DIAGNOSIS — Z21 Asymptomatic human immunodeficiency virus [HIV] infection status: Secondary | ICD-10-CM | POA: Insufficient documentation

## 2019-07-13 DIAGNOSIS — F17210 Nicotine dependence, cigarettes, uncomplicated: Secondary | ICD-10-CM | POA: Insufficient documentation

## 2019-07-13 DIAGNOSIS — R059 Cough, unspecified: Secondary | ICD-10-CM

## 2019-07-13 NOTE — ED Triage Notes (Signed)
Pt to eD with c/o cough onset a little while ago when he got on the bus.

## 2019-07-14 ENCOUNTER — Emergency Department (HOSPITAL_COMMUNITY): Payer: Self-pay

## 2019-07-14 MED ORDER — BENZONATATE 100 MG PO CAPS: 100.0000 mg | ORAL_CAPSULE | Freq: Three times a day (TID) | ORAL | 0 refills | Status: DC | PRN

## 2019-07-14 NOTE — ED Provider Notes (Signed)
[facility] EMERGENCY DEPARTMENT Provider Note   CSN: [id number] Arrival date & time: 07/13/19  2214     History Chief Complaint  Patient presents with  . Cough    [name] is a 33 y.o. male with a hx of substance abuse, ADHD, HIV (last CD4 count 588 in July 2020),  presents to the Emergency Department complaining of acute onset cough just before arrival.  Patient reports when he got on the bus just prior to arrival he began coughing.  He reports he assumed that he had Covid therefore came immediately to the emergency department.  He reports subjective fevers about the same time that he coughed.  Patient reports cough is not severe and he has not had persistent coughing in the waiting room.  No known exposures to Covid but he reports he is high risk because he rides the bus.  No headache, neck pain, neck stiffness, chest pain, shortness of breath, abdominal pain, nausea, vomiting, diarrhea, weakness, dizziness, syncope.  No aggravating or alleviating factors.  No treatments prior to arrival.   The history is provided by the patient and medical records. No language interpreter was used.       Past Medical History:  Diagnosis Date  . ADHD (attention deficit hyperactivity disorder)   . Drug abuse ([place])    meth, marijuana    Patient Active Problem List   Diagnosis Date Noted  . Methamphetamine use disorder, severe, dependence ([place]) 07/10/2019  . 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 MDD (major depressive disorder), recurrent severe, without psychosis ([place]) 07/09/2019  . HIV (human immunodeficiency virus infection) ([place]) 06/05/2019    No past surgical history on file.     Family History  Problem Relation Age of Onset  . Diabetes Mother   . Hypertension Mother   . Cancer Mother   . Cancer Father   . Diabetes Father   . Hypertension Father     Social History   Tobacco Use  . Smoking status: Current Every Day Smoker    Packs/day:  1.00    Years: 10.00    Pack years: 10.00    Types: Cigarettes  . Smokeless tobacco: Never Used  Substance Use Topics  . Alcohol use: Yes  . Drug use: Yes    Types: Marijuana, Methamphetamines    Comment: last used 4 days ago    Home Medications Prior to Admission medications   Medication Sig Start Date End Date Taking? Authorizing Provider  benzonatate (TESSALON PERLES) 100 MG capsule Take 1 capsule (100 mg total) by mouth 3 (three) times daily as needed for cough (cough). 07/14/19   Maudy Yonan, Jarrett Soho, PA-C  omeprazole (PRILOSEC) 20 MG capsule Take 1 capsule (20 mg total) by mouth daily. Patient not taking: Reported on 02/14/2019 06/24/18 02/14/19  Montine Circle, PA-C    Allergies    Patient has no known allergies.  Review of Systems   Review of Systems  Constitutional: Positive for fever ( Subjective). Negative for appetite change, chills and fatigue.  HENT: Negative for congestion, ear discharge, ear pain, mouth sores, postnasal drip, rhinorrhea, sinus pressure and sore throat.   Eyes: Negative for visual disturbance.  Respiratory: Positive for cough. Negative for chest tightness, shortness of breath, wheezing and stridor.   Cardiovascular: Negative for chest pain, palpitations and leg swelling.  Gastrointestinal: Negative for abdominal pain, diarrhea, nausea and vomiting.  Genitourinary: Negative for dysuria, frequency, hematuria and urgency.  Musculoskeletal: Negative for arthralgias, back pain, myalgias and neck stiffness.  Skin: Negative for rash.  Neurological: Negative for syncope, light-headedness, numbness and headaches.  Hematological: Negative for adenopathy.  Psychiatric/Behavioral: The patient is not nervous/anxious.   All other systems reviewed and are negative.   Physical Exam Updated Vital Signs BP 117/82 (BP Location: Right Arm)   Pulse 95   Temp 98.2 F (36.8 C) (Oral)   Resp 18   Ht 6\' 1"  (1.854 m)   Wt 81.6 kg   SpO2 99%   BMI 23.75 kg/m    Physical Exam Vitals and nursing note reviewed.  Constitutional:      General: He is not in acute distress.    Appearance: He is not diaphoretic.  HENT:     Head: Normocephalic.  Eyes:     General: No scleral icterus.    Conjunctiva/sclera: Conjunctivae normal.  Cardiovascular:     Rate and Rhythm: Normal rate and regular rhythm.     Pulses: Normal pulses.          Radial pulses are 2+ on the right side and 2+ on the left side.  Pulmonary:     Effort: No tachypnea, accessory muscle usage, prolonged expiration, respiratory distress or retractions.     Breath sounds: Normal breath sounds. No stridor.     Comments: Equal chest rise. No increased work of breathing. Clear and equal breath sounds. Abdominal:     General: There is no distension.     Palpations: Abdomen is soft.     Tenderness: There is no abdominal tenderness. There is no guarding or rebound.  Musculoskeletal:     Cervical back: Normal range of motion.     Comments: Moves all extremities equally and without difficulty.  Skin:    General: Skin is warm and dry.     Capillary Refill: Capillary refill takes less than 2 seconds.  Neurological:     Mental Status: He is alert.     GCS: GCS eye subscore is 4. GCS verbal subscore is 5. GCS motor subscore is 6.     Comments: Speech is clear and goal oriented.  Psychiatric:        Mood and Affect: Mood normal.     ED Results / Procedures / Treatments   Labs (all labs ordered are listed, but only abnormal results are displayed) Labs Reviewed - No data to display  EKG None  Radiology DG Chest 2 View  Result Date: 07/14/2019 CLINICAL DATA:  Cough EXAM: CHEST - 2 VIEW COMPARISON:  Radiograph 06/23/2018 FINDINGS: No consolidation, features of edema, pneumothorax, or effusion. Pulmonary vascularity is normally distributed. The cardiomediastinal contours are unremarkable. No acute osseous or soft tissue abnormality. Marked dextrocurvature of the lower thoracic spine is  similar to comparison exams. Associated chest wall deformity. IMPRESSION: No acute cardiopulmonary abnormality. Electronically Signed   By: 14/01/2018 M.D.   On: 07/14/2019 01:07    Procedures Procedures (including critical care time)  Medications Ordered in ED Medications - No data to display  ED Course  I have reviewed the triage vital signs and the nursing notes.  Pertinent labs & imaging results that were available during my care of the patient were reviewed by me and considered in my medical decision making (see chart for details).  Clinical Course as of Jul 13 516  Sun Jul 14, 2019  0515 No evidence of pneumonia, pneumothorax or pulmonary edema.  Significant scoliosis is noted.  I personally evaluated these images.  DG Chest 2 View [HM]    Clinical Course User Index [  HM] Raechal Raben, Boyd KerbsHannah, PA-C   MDM Rules/Calculators/A&P                       Juanda Chancerik Schnee was evaluated in Emergency Department on 07/14/2019 for the symptoms described in the history of present illness. He was evaluated in the context of the global COVID-19 pandemic, which necessitated consideration that the patient might be at risk for infection with the SARS-CoV-2 virus that causes COVID-19. Institutional protocols and algorithms that pertain to the evaluation of patients at risk for COVID-19 are in a state of rapid change based on information released by regulatory bodies including the CDC and federal and state organizations. These policies and algorithms were followed during the patient's care in the ED.  Patient presents with several hours of coughing.  On arrival and throughout his time here in the emergency department he is afebrile without tachycardia, hypotension or hypoxia.  No increased work of breathing on my exam.  Lungs are clear and equal.  Chest x-ray is reassuring.  Given the patient has only had symptoms for several hours, I do not feel that Covid testing is warranted.  No evidence of pneumonia.   Highly doubt pulmonary embolism as the cause of patient's symptoms.  Will give cough medication and discharged home.  Patient instructed to isolate and return for new or worsening symptoms.   Final Clinical Impression(s) / ED Diagnoses Final diagnoses:  Cough    Rx / DC Orders ED Discharge Orders         Ordered    benzonatate (TESSALON PERLES) 100 MG capsule  3 times daily PRN     07/14/19 0508           Eldo Umanzor, Dahlia ClientHannah, PA-C 07/14/19 0517    Derwood KaplanNanavati, Ankit, MD 07/14/19 [phone]

## 2019-07-14 NOTE — Discharge Instructions (Addendum)
1. Medications: Tessalon Perles as needed for cough, alternate tylenol and ibuprofen for fever control, continue usual home medications 2. Treatment: rest, drink plenty of fluids, isolate for the next 10 days 3. Follow Up: Please followup with your primary doctor if your symptoms are not improving after 10-14 days; Please return to the ER for high fevers, persistent vomiting, shortness of breath or other concerns.

## 2019-07-15 ENCOUNTER — Other Ambulatory Visit: Payer: Self-pay

## 2019-07-15 ENCOUNTER — Emergency Department (HOSPITAL_COMMUNITY): Payer: Self-pay

## 2019-07-15 ENCOUNTER — Encounter (HOSPITAL_COMMUNITY): Payer: Self-pay | Admitting: Emergency Medicine

## 2019-07-15 ENCOUNTER — Emergency Department (HOSPITAL_COMMUNITY)
Admission: EM | Admit: 2019-07-15 | Discharge: 2019-07-15 | Disposition: A | Discharge status: Home / Self Care | Payer: Self-pay | Attending: Emergency Medicine | Admitting: Emergency Medicine

## 2019-07-15 DIAGNOSIS — G8929 Other chronic pain: Secondary | ICD-10-CM

## 2019-07-15 DIAGNOSIS — F1910 Other psychoactive substance abuse, uncomplicated: Secondary | ICD-10-CM | POA: Insufficient documentation

## 2019-07-15 DIAGNOSIS — M25579 Pain in unspecified ankle and joints of unspecified foot: Secondary | ICD-10-CM | POA: Insufficient documentation

## 2019-07-15 DIAGNOSIS — Z590 Homelessness: Secondary | ICD-10-CM | POA: Insufficient documentation

## 2019-07-15 DIAGNOSIS — F17210 Nicotine dependence, cigarettes, uncomplicated: Secondary | ICD-10-CM | POA: Insufficient documentation

## 2019-07-15 NOTE — ED Triage Notes (Signed)
Patient here from home requesting detox from meth and benzos, last used today. Also reports left ankle pain. Denies SI.

## 2019-07-15 NOTE — ED Provider Notes (Signed)
[facility]-EMERGENCY DEPT Provider Note   CSN: [id number] Arrival date & time: 07/15/19  2223     History Chief Complaint  Patient presents with  . detox  . Ankle Pain    [name] is a 33 y.o. male.  HPI    Pt with polysubstance abuse comes in with cc of detox. He was supposed to go to [facility] this morning, but didn't. He used meth and benzo earlier today. He is also homeless. He has chronic ankle pain, no new injury.   Past Medical History:  Diagnosis Date  . ADHD (attention deficit hyperactivity disorder)   . Drug abuse ([facility])    meth, marijuana    Patient Active Problem List   Diagnosis Date Noted  . Methamphetamine use disorder, severe, dependence ([facility]) 07/10/2019  . 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 HIV (human immunodeficiency virus infection) ([facility]) 06/05/2019    History reviewed. No pertinent surgical history.     Family History  Problem Relation Age of Onset  . Diabetes Mother   . Hypertension Mother   . Cancer Mother   . Cancer Father   . Diabetes Father   . Hypertension Father     Social History   Tobacco Use  . Smoking status: Current Every Day Smoker    Packs/day: 1.00    Years: 10.00    Pack years: 10.00    Types: Cigarettes  . Smokeless tobacco: Never Used  Substance Use Topics  . Alcohol use: Yes  . Drug use: Yes    Types: Marijuana, Methamphetamines    Comment: last used 4 days ago    Home Medications Prior to Admission medications   Medication Sig Start Date End Date Taking? Authorizing Provider  benzonatate (TESSALON PERLES) 100 MG capsule Take 1 capsule (100 mg total) by mouth 3 (three) times daily as needed for cough (cough). 07/14/19   Muthersbaugh, Dahlia Client, PA-C  omeprazole (PRILOSEC) 20 MG capsule Take 1 capsule (20 mg total) by mouth daily. Patient not taking: Reported on 02/14/2019 06/24/18  02/14/19  Roxy Horseman, PA-C    Allergies    Patient has no known allergies.  Review of Systems   Review of Systems  Constitutional: Positive for activity change.  Musculoskeletal: Positive for arthralgias.    Physical Exam Updated Vital Signs BP 126/81 (BP Location: Left Arm)   Pulse 86   Temp 98.6 F (37 C) (Oral)   Resp 18   Ht 6\' 3"  (1.905 m)   Wt 81.6 kg   SpO2 98%   BMI 22.50 kg/m   Physical Exam Vitals and nursing note reviewed.  Constitutional:      Appearance: He is well-developed.  HENT:     Head: Atraumatic.  Cardiovascular:     Rate and Rhythm: Normal rate.  Pulmonary:     Effort: Pulmonary effort is normal.  Musculoskeletal:     Cervical back: Neck supple.  Skin:    General: Skin is warm.  Neurological:     Mental Status: He is alert and oriented to person, place, and time.  Psychiatric:        Behavior: Behavior normal.        Thought Content: Thought content normal.        Judgment: Judgment normal.     ED Results / Procedures / Treatments   Labs (all labs ordered are listed, but only abnormal results are displayed) Labs Reviewed - No  data to display  EKG None  Radiology DG Chest 2 View  Result Date: 07/14/2019 CLINICAL DATA:  Cough EXAM: CHEST - 2 VIEW COMPARISON:  Radiograph 06/23/2018 FINDINGS: No consolidation, features of edema, pneumothorax, or effusion. Pulmonary vascularity is normally distributed. The cardiomediastinal contours are unremarkable. No acute osseous or soft tissue abnormality. Marked dextrocurvature of the lower thoracic spine is similar to comparison exams. Associated chest wall deformity. IMPRESSION: No acute cardiopulmonary abnormality. Electronically Signed   By: Lovena Le M.D.   On: 07/14/2019 01:07   DG Ankle Complete Left  Result Date: 07/15/2019 CLINICAL DATA:  Initial evaluation for acute injury, twisted ankle. EXAM: LEFT ANKLE COMPLETE - 3+ VIEW COMPARISON:  None. FINDINGS: There is no evidence of  fracture, dislocation, or joint effusion. There is no evidence of arthropathy or other focal bone abnormality. Soft tissues are unremarkable. IMPRESSION: Negative. Electronically Signed   By: Jeannine Boga M.D.   On: 07/15/2019 23:06    Procedures Procedures (including critical care time)  Medications Ordered in ED Medications - No data to display  ED Course  I have reviewed the triage vital signs and the nursing notes.  Pertinent labs & imaging results that were available during my care of the patient were reviewed by me and considered in my medical decision making (see chart for details).    MDM Rules/Calculators/A&P                      [name] was evaluated in Emergency Department on 07/15/2019 for the symptoms described in the history of present illness. He was evaluated in the context of the global COVID-19 pandemic, which necessitated consideration that the patient might be at risk for infection with the SARS-CoV-2 virus that causes COVID-19. Institutional protocols and algorithms that pertain to the evaluation of patients at risk for COVID-19 are in a state of rapid change based on information released by regulatory bodies including the CDC and federal and state organizations. These policies and algorithms were followed during the patient's care in the ED.  Pt has ankle pain and detox as his primary complaint.  No clinical concerns for acute withdrawals or toxicity. Pt denies nausea, emesis, fevers, chills, chest pains, shortness of breath, headaches, abdominal pain, uti like symptoms. Ankle pain is chronic.   Final Clinical Impression(s) / ED Diagnoses Final diagnoses:  Polysubstance abuse ([place])  Chronic ankle pain, unspecified laterality    Rx / DC Orders ED Discharge Orders    None       Varney Biles, MD 07/15/19 2322

## 2019-07-15 NOTE — Discharge Instructions (Addendum)
Substance Abuse Treatment Programs ° °Intensive Outpatient Programs °[facility]     °[address]      °[place], [place]                   °[phone]      ° °[facility] °[address] °[place], [place] °336-379-7146 ° °[facility]     °(Inpatient and outpatient)     °[address]           °336-832-9800   ° °[facility] °336-288-1484 (Suboxone and Methadone) ° °[address]      °[place], [place] [address]      °336-882-2125      ° °[address] °[place], [place] °852-3033 ° °[facility] (Outpatient/Inpatient, [facility])    °(insurance only) [phone]      °       °[facility] (Groups & Residential) °[place], [place] °336-389-1413 ° °   °[facility]     °[address]     °[place], [place]      °336-389-1413      ° °[facility] (for caregivers and family) °[address] °[place], [place] °336-299-4655 ° ° ° ° ° °Residential Treatment Programs °[facility]      °[address], [place], [place] [address]  °([phone]      ° °[facility] °[address] [place], [place] [address] °[phone] ° °[facility]        °336-248-8914      ° °[facility] °1-800-659-3381 ° °[facility]             °[address]                                         °[place], [place]                                                °877-615-2722 or 336-784-9470                              ° °[facility] [place] °[address] °[place], [address] °1.877.941.8954 ° °[facility]    °[address]      °[place], [place]     °336-273-5306      ° °[facility] °[address] °, [place] °336-285-9073 ° °[facility]   °[address]     °[place], [place] [address]     °336-899-1550      °Admissions: 8am-3pm M-F ° °[facility] °[address] °[place] °336-227-7417 ° °[facility]: Residential Program (90 Days)   °[place], [place]      °336-725-8389 or 800-758-6077    ° °[facility] °[place], [place] °(Walk in Hours over the weekend or by referral) ° °[facility] °[address], [place],  [address] °(336) [phone] ° °Crisis Mobile: [facility]:  1-877-626-1772 (for crisis response 24 hours a day) °[facility] Hotline:      1-800-256-2452 °Outpatient Psychiatry and Counseling ° °[facility]: Mobile Crisis   Management 24 hours:  1-877-626-1772 ° °[facility] sliding scale fee and walk in schedule: M-F 8am-12pm/1pm-3pm °[address]  °[place], [place] [address] °336-387-6161 ° °[facility] °[address] °[place], [place] [address] °336-703-9650 ° °[facility] (Formerly known [facility])- new patient walk-in appointments available Monday - Friday 8am -3pm.          °[address] °[place], [place] [address] °336-676-6840 or crisis line- 336-676-6905 ° °[facility] Intensive Outpatient Therapy Program °[address] °[place], [place] [address] °336-832-9804 ° °[facility]                  °Crisis Services      °336.641.4993      °[address]     °[place], [place] [address]                ° °[facility]   °[facility] °[phone] °[address] °[place], [place] [address] ° ° °[facility]          °[address]  °[place], [place] [address]       °(336) [phone] ° °[facility] °[address] °[place], [place] [address] °336-292-1510 ° °[facility]    °[address]    °[place], [place] [address]     °336-632-3505      ° °Parish McKinney, MD     °[address]     °[place] [address]     °336-282-1251     °  °[facility] °[address] °[place] [address] ° °[facility]     °[address]     °[place], [place]      °336-542-2076      ° °[facility] °Shamsher Ahluwalia, MD °[address] °[place], [place] [address] °336-420-9558 ° °[facility]     °[address]     °[place], [place] [address]     °336-274-1237      ° °[facility] °[address] °[place], [place] [address] °336-854-4450 °Resources for Temporary Residential Assistance/Crisis Centers ° °DAY CENTERS °[facility] °M-F 8am-3pm   °[address] [place], [place] [address]   336-332-0824 °Services include: laundry, barbering, support groups, case management, phone  & computer access, showers, AA/NA mtgs, mental health/substance abuse nurse, job skills class, disability information, VA assistance, spiritual classes, etc.  ° °HOMELESS SHELTERS ° °[facility]     °[facility]   °[address], [place]     °336.271.5959       °       °[facility] (women and children)       °[address] °[place], [place] [address] °336-275-0820 °[email or web address] for application and process °Application Required ° °[facility]   °[address]    °[place]  [address]     °336.886.4922       °             °[facility] °[address] °,  [address] °336.273.5572 °336-235-0363(schedule application appt.) °Application Required ° °[facility] (women only)    °[address]     °[place],  [address]     °336-884-1039      °  Intake starts 6pm daily °Need valid ID, SSC, & Police report °[facility] [place] °[address] °[place], [place] °336-881-5420 °Application Required ° °[facility] (men only)     °[address]      °[place], [place]     °[phone]      ° °[facility] °(Pregnant women only) °[address] °[place], [place] °336-275-0206 ° °[facility]      °[address]      °[place], [place] [address]     °336-722-9951      °       °[facility] °[address] °[place], [place] °336-723-1848 °90 day commitment/SA/Application process ° °[facility](men only)     °[address]     °[place], [place]     °336-748-1962       °Check-in at 7pm     °       °[facility] °[address] °[place], [place] [address] °336-248-6684 °Men/Women/Women and Children must be there by 7 pm ° °[facility] °[place], [place] °336-722-8721                ° °

## 2019-09-03 ENCOUNTER — Emergency Department (HOSPITAL_COMMUNITY)
Admission: EM | Admit: 2019-09-03 | Discharge: 2019-09-04 | Disposition: A | Discharge status: Home / Self Care | Payer: Self-pay | Attending: Emergency Medicine | Admitting: Emergency Medicine

## 2019-09-03 ENCOUNTER — Other Ambulatory Visit: Payer: Self-pay

## 2019-09-03 ENCOUNTER — Encounter (HOSPITAL_COMMUNITY): Payer: Self-pay | Admitting: Emergency Medicine

## 2019-09-03 DIAGNOSIS — Y939 Activity, unspecified: Secondary | ICD-10-CM | POA: Insufficient documentation

## 2019-09-03 DIAGNOSIS — B20 Human immunodeficiency virus [HIV] disease: Secondary | ICD-10-CM | POA: Insufficient documentation

## 2019-09-03 DIAGNOSIS — X500XXA Overexertion from strenuous movement or load, initial encounter: Secondary | ICD-10-CM | POA: Insufficient documentation

## 2019-09-03 DIAGNOSIS — S29012A Strain of muscle and tendon of back wall of thorax, initial encounter: Secondary | ICD-10-CM | POA: Insufficient documentation

## 2019-09-03 DIAGNOSIS — Y929 Unspecified place or not applicable: Secondary | ICD-10-CM | POA: Insufficient documentation

## 2019-09-03 DIAGNOSIS — Y999 Unspecified external cause status: Secondary | ICD-10-CM | POA: Insufficient documentation

## 2019-09-03 DIAGNOSIS — F17210 Nicotine dependence, cigarettes, uncomplicated: Secondary | ICD-10-CM | POA: Insufficient documentation

## 2019-09-03 HISTORY — DX: Asymptomatic human immunodeficiency virus (hiv) infection status: Z21

## 2019-09-03 MED ORDER — LIDOCAINE 5 % EX PTCH
1.0000 | MEDICATED_PATCH | CUTANEOUS | Status: DC
  Administered 2019-09-04: 1 via TRANSDERMAL
  Filled 2019-09-03: qty 1

## 2019-09-03 NOTE — ED Triage Notes (Signed)
Patient reports low back pain for several weeks , denies injury or fall , no urinary discomfort , ambulatory .

## 2019-09-04 MED ORDER — METHOCARBAMOL 500 MG PO TABS: 500.0000 mg | ORAL_TABLET | Freq: Two times a day (BID) | ORAL | 0 refills | Status: DC | PRN

## 2019-09-04 MED ORDER — LIDOCAINE 5 % EX PTCH: 1.0000 | MEDICATED_PATCH | CUTANEOUS | 0 refills | Status: DC

## 2019-09-04 NOTE — Discharge Instructions (Addendum)
Alternate ice and heat to areas of injury 3-4 times per day to limit inflammation and spasm.  Avoid strenuous activity and heavy lifting.  We recommend consistent use of naproxen in addition to Robaxin for muscle spasms. Do not drive or drink alcohol after taking Robaxin as it may make you drowsy and impair your judgment.  We recommend follow-up with a primary care doctor to ensure resolution of symptoms.  Return to the ED for any new or concerning symptoms. 

## 2019-09-04 NOTE — ED Provider Notes (Signed)
[facility] EMERGENCY DEPARTMENT Provider Note   CSN: [id number] Arrival date & time: 09/03/19  2205     History Chief Complaint  Patient presents with  . Back Pain    [name] is a 34 y.o. male.  34 year old male with a history of drug abuse, HIV (CD4 588) presents to the emergency department complaining of pain to his right mid back.  States that he has had pain chronically over many months, but it became worse yesterday after he lifted a heavy suitcase with his right arm.  Has been taking over-the-counter medications without significant improvement.  Notes increased discomfort with twisting, bending, right arm movement.  Denies aggravation with breathing as well as any fevers, shortness of breath, bowel or bladder incontinence, extremity numbness or paresthesias, extremity weakness.  No history of direct trauma or injury to the back.  The history is provided by the patient. No language interpreter was used.  Back Pain      Past Medical History:  Diagnosis Date  . ADHD (attention deficit hyperactivity disorder)   . Drug abuse ([place])    meth, marijuana  . HIV positive [facility])     Patient Active Problem List   Diagnosis Date Noted  . Methamphetamine use disorder, severe, dependence ([place]) 07/10/2019  . 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 MDD (major depressive disorder), recurrent severe, without psychosis ([employer]) 07/09/2019  . HIV (human immunodeficiency virus infection) ([place]) 06/05/2019    History reviewed. No pertinent surgical history.     Family History  Problem Relation Age of Onset  . Diabetes Mother   . Hypertension Mother   . Cancer Mother   . Cancer Father   . Diabetes Father   . Hypertension Father     Social History   Tobacco Use  . Smoking status: Current Every Day Smoker    Packs/day: 1.00    Years: 10.00    Pack years: 10.00    Types: Cigarettes  . Smokeless tobacco: Never Used  Substance Use Topics  .  Alcohol use: Yes  . Drug use: Yes    Types: Marijuana, Methamphetamines    Comment: last used 4 days ago    Home Medications Prior to Admission medications   Medication Sig Start Date End Date Taking? Authorizing Provider  benzonatate (TESSALON PERLES) 100 MG capsule Take 1 capsule (100 mg total) by mouth 3 (three) times daily as needed for cough (cough). 07/14/19   Muthersbaugh, Jarrett Soho, PA-C  lidocaine (LIDODERM) 5 % Place 1 patch onto the skin daily. Remove & Discard patch within 12 hours or as directed by MD 09/04/19   Antonietta Breach, PA-C  methocarbamol (ROBAXIN) 500 MG tablet Take 1 tablet (500 mg total) by mouth every 12 (twelve) hours as needed for muscle spasms. 09/04/19   Antonietta Breach, PA-C  omeprazole (PRILOSEC) 20 MG capsule Take 1 capsule (20 mg total) by mouth daily. Patient not taking: Reported on 02/14/2019 06/24/18 02/14/19  Montine Circle, PA-C    Allergies    Patient has no known allergies.  Review of Systems   Review of Systems  Musculoskeletal: Positive for back pain.  Ten systems reviewed and are negative for acute change, except as noted in the HPI.    Physical Exam Updated Vital Signs BP (!) 147/74 (BP Location: Right Arm)   Pulse 95   Temp 98.5 F (36.9 C) (Oral)   SpO2 100%   Physical Exam Vitals and nursing note reviewed.  Constitutional:      General:  He is not in acute distress.    Appearance: He is well-developed. He is not diaphoretic.     Comments: Nontoxic appearing and in NAD  HENT:     Head: Normocephalic and atraumatic.  Eyes:     General: No scleral icterus.    Conjunctiva/sclera: Conjunctivae normal.  Pulmonary:     Effort: Pulmonary effort is normal. No respiratory distress.     Comments: Respirations even and unlabored Musculoskeletal:        General: Normal range of motion.     Cervical back: Normal range of motion.       Back:     Comments: Tenderness to palpation to the right lower thoracic paraspinal muscles.  No appreciable  spasm.  No tenderness to the thoracic or lumbosacral midline.  No bony deformities, step-offs, crepitus.  Skin:    General: Skin is warm and dry.     Coloration: Skin is not pale.     Findings: No erythema or rash.  Neurological:     Mental Status: He is alert and oriented to person, place, and time.     Coordination: Coordination normal.     Comments: Preserved grip strength and shoulder shrug against resistance.  Normal sensation in all extremities.  Ambulatory with steady gait.  Psychiatric:        Behavior: Behavior normal.     ED Results / Procedures / Treatments   Labs (all labs ordered are listed, but only abnormal results are displayed) Labs Reviewed - No data to display  EKG None  Radiology No results found.  Procedures Procedures (including critical care time)  Medications Ordered in ED Medications  lidocaine (LIDODERM) 5 % 1 patch (has no administration in time range)    ED Course  I have reviewed the triage vital signs and the nursing notes.  Pertinent labs & imaging results that were available during my care of the patient were reviewed by me and considered in my medical decision making (see chart for details).    MDM Rules/Calculators/A&P                      Patient with back pain.  Symptoms have been fairly chronic, but patient reports worsening after lifting a heavy suitcase yesterday.  He is neurovascularly intact with reproducible paraspinal tenderness.  Suspect degree of muscle strain/spasm.  No loss of bowel or bladder control.  No concern for cauda equina.  No fever, cancer history.  Pain is not pleuritic, per patient.  VSS.  Will continue with outpatient management including the use of NSAIDs, Robaxin, Lidoderm patches.  Encouraged follow-up with a primary care doctor.  Return precautions discussed and provided. Patient discharged in stable condition with no unaddressed concerns.   Final Clinical Impression(s) / ED Diagnoses Final diagnoses:    Strain of mid-back, initial encounter    Rx / DC Orders ED Discharge Orders         Ordered    methocarbamol (ROBAXIN) 500 MG tablet  Every 12 hours PRN     09/04/19 0001    lidocaine (LIDODERM) 5 %  Every 24 hours     09/04/19 0001           Antony Madura, PA-C 09/04/19 0019    Shon Baton, MD 09/04/19 [phone]

## 2019-09-13 ENCOUNTER — Emergency Department (HOSPITAL_COMMUNITY)
Admission: EM | Admit: 2019-09-13 | Discharge: 2019-09-14 | Disposition: A | Discharge status: Home / Self Care | Payer: Self-pay | Attending: Emergency Medicine | Admitting: Emergency Medicine

## 2019-09-13 ENCOUNTER — Emergency Department (HOSPITAL_COMMUNITY): Payer: Self-pay

## 2019-09-13 ENCOUNTER — Encounter (HOSPITAL_COMMUNITY): Payer: Self-pay | Admitting: Emergency Medicine

## 2019-09-13 ENCOUNTER — Other Ambulatory Visit: Payer: Self-pay

## 2019-09-13 DIAGNOSIS — F1510 Other stimulant abuse, uncomplicated: Secondary | ICD-10-CM | POA: Insufficient documentation

## 2019-09-13 DIAGNOSIS — R002 Palpitations: Secondary | ICD-10-CM | POA: Insufficient documentation

## 2019-09-13 DIAGNOSIS — Z20822 Contact with and (suspected) exposure to covid-19: Secondary | ICD-10-CM | POA: Insufficient documentation

## 2019-09-13 DIAGNOSIS — F17210 Nicotine dependence, cigarettes, uncomplicated: Secondary | ICD-10-CM | POA: Insufficient documentation

## 2019-09-13 DIAGNOSIS — B20 Human immunodeficiency virus [HIV] disease: Secondary | ICD-10-CM | POA: Insufficient documentation

## 2019-09-13 DIAGNOSIS — R0602 Shortness of breath: Secondary | ICD-10-CM | POA: Insufficient documentation

## 2019-09-13 DIAGNOSIS — L739 Follicular disorder, unspecified: Secondary | ICD-10-CM | POA: Insufficient documentation

## 2019-09-13 DIAGNOSIS — R0600 Dyspnea, unspecified: Secondary | ICD-10-CM | POA: Insufficient documentation

## 2019-09-13 DIAGNOSIS — F1210 Cannabis abuse, uncomplicated: Secondary | ICD-10-CM | POA: Insufficient documentation

## 2019-09-13 DIAGNOSIS — R21 Rash and other nonspecific skin eruption: Secondary | ICD-10-CM | POA: Insufficient documentation

## 2019-09-13 NOTE — ED Triage Notes (Signed)
Patient reports chronic SOB for several months , denies cough or fever , patient added itchy " Zits" on my chest this week .

## 2019-09-14 LAB — BASIC METABOLIC PANEL
Anion gap: 8 (ref 5–15)
BUN: 15 mg/dL (ref 6–20)
CO2: 29 mmol/L (ref 22–32)
Calcium: 9.2 mg/dL (ref 8.9–10.3)
Chloride: 101 mmol/L (ref 98–111)
Creatinine, Ser: 0.95 mg/dL (ref 0.61–1.24)
GFR calc Af Amer: >60 mL/min (ref >60)
GFR calc non Af Amer: >60 mL/min (ref >60)
Glucose, Bld: 98 mg/dL (ref 70–99)
Potassium: 3.1 mmol/L — ABNORMAL LOW (ref 3.5–5.1)
Sodium: 138 mmol/L (ref 135–145)

## 2019-09-14 LAB — CBC WITH DIFFERENTIAL/PLATELET
Abs Immature Granulocytes: 0.01 10*3/uL (ref 0.00–0.07)
Basophils Absolute: 0 10*3/uL (ref 0.0–0.1)
Basophils Relative: 1 %
Eosinophils Absolute: 0.4 10*3/uL (ref 0.0–0.5)
Eosinophils Relative: 10 %
HCT: 43.3 % (ref 39.0–52.0)
Hemoglobin: 14.6 g/dL (ref 13.0–17.0)
Immature Granulocytes: 0 %
Lymphocytes Relative: 52 %
Lymphs Abs: 2.3 10*3/uL (ref 0.7–4.0)
MCH: 29.9 pg (ref 26.0–34.0)
MCHC: 33.7 g/dL (ref 30.0–36.0)
MCV: 88.7 fL (ref 80.0–100.0)
Monocytes Absolute: 0.3 10*3/uL (ref 0.1–1.0)
Monocytes Relative: 8 %
Neutro Abs: 1.3 10*3/uL — ABNORMAL LOW (ref 1.7–7.7)
Neutrophils Relative %: 29 %
Platelets: 255 10*3/uL (ref 150–400)
RBC: 4.88 MIL/uL (ref 4.22–5.81)
RDW: 14 % (ref 11.5–15.5)
WBC: 4.4 10*3/uL (ref 4.0–10.5)
nRBC: 0 % (ref 0.0–0.2)

## 2019-09-14 LAB — D-DIMER, QUANTITATIVE: D-Dimer, Quant: 0.34 ug/mL-FEU (ref 0.00–0.50)

## 2019-09-14 LAB — NOVEL CORONAVIRUS, NAA (HOSP ORDER, SEND-OUT TO REF LAB; TAT 18-24 HRS): SARS-CoV-2, NAA: NOT DETECTED

## 2019-09-14 LAB — TROPONIN I (HIGH SENSITIVITY)
Troponin I (High Sensitivity): 3 ng/L (ref <18)
Troponin I (High Sensitivity): 3 ng/L (ref <18)

## 2019-09-14 MED ORDER — ALBUTEROL SULFATE HFA 108 (90 BASE) MCG/ACT IN AERS
2.0000 | INHALATION_SPRAY | RESPIRATORY_TRACT | Status: DC | PRN
  Administered 2019-09-14: 2 via RESPIRATORY_TRACT
  Filled 2019-09-14: qty 6.7

## 2019-09-14 MED ORDER — CLINDAMYCIN PHOSPHATE 1 % EX LOTN: TOPICAL_LOTION | Freq: Two times a day (BID) | CUTANEOUS | 0 refills | Status: DC

## 2019-09-14 MED ORDER — POTASSIUM CHLORIDE CRYS ER 20 MEQ PO TBCR
40.0000 meq | EXTENDED_RELEASE_TABLET | Freq: Once | ORAL | Status: AC
  Administered 2019-09-14: 01:00:00 40 meq via ORAL
  Filled 2019-09-14: qty 2

## 2019-09-14 NOTE — ED Notes (Signed)
Pt has been asking for a sandwich since he arrived in the room. Pt is given a sandwich, apple sauce, cheese and crackers. He is also given a sprite to drink

## 2019-09-14 NOTE — ED Provider Notes (Signed)
[name] EMERGENCY DEPARTMENT Provider Note   CSN: [id number] Arrival date & time: 09/13/19  2312     History Chief Complaint  Patient presents with   Shortness of Breath    [name] is a 34 y.o. male with a hx of HIV (untreated), polysubstance abuse including meth and marijuana presents to the Emergency Department with multiple complaints.  Patient initially complaining of rash on his chest.  He reports it has been intermittent for the last month.  He does shave his chest and finds it seems to be worse after this.  He reports that he often scratches his chest because it itches and additionally will " squeeze" some of the bumps.  He does report last year one of the spots got infected.  Patient denies skin popping.  No treatments prior to arrival.  Nothing seems to make the symptoms better.  Patient reports that he rarely injects drugs. Pt denies alcohol usage. Pt is a smoker - cigarettes, marijuana and methamphetamine.  Additionally, patient complains of 1 week of shortness of breath.  He reports dyspnea on exertion with occasional palpitations.  He denies fever, chills, headache, neck pain, chest pain, abdominal pain, nausea, vomiting, diarrhea, weakness, dizziness, syncope.  No known Covid contacts.  No recent Covid testing.  Patient reports no history of DVT/PE, no periods of immobilization, no leg swelling, no hormonal usage.  Records reviewed.  Patient was diagnosed with HIV in June 2020.  Patient was seen again in July 2020 and informed of his diagnosis however has not followed up since that time.  At that time his CD4 count was 588 with a significant viral count.   The history is provided by the patient and medical records. No language interpreter was used.       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 No pertinent surgical history.     Family History  Problem Relation Age of Onset   Diabetes Mother    Hypertension Mother    Cancer Mother    Cancer Father    Diabetes Father    Hypertension Father     Social History   Tobacco Use   Smoking status: Current Every Day Smoker    Packs/day: 1.00    Years: 10.00    Pack years: 10.00    Types: Cigarettes   Smokeless tobacco: Never Used  Substance Use Topics   Alcohol use: Yes   Drug use: Yes    Types: Marijuana, Methamphetamines    Comment: last used 4 days ago    Home Medications Prior to Admission medications   Medication Sig Start Date End Date Taking? Authorizing Provider  benzonatate (TESSALON PERLES) 100 MG capsule Take 1 capsule (100 mg total) by mouth 3 (three) times daily as needed for cough (cough). 07/14/19   Holston Oyama, Jarrett Soho, PA-C  clindamycin (CLEOCIN T) 1 % lotion Apply topically 2 (two) times daily. 09/14/19   Garnet Chatmon, Jarrett Soho, PA-C  lidocaine (LIDODERM) 5 % Place 1 patch onto the skin daily. Remove & Discard patch within 12 hours or  as directed by MD 09/04/19   Antony Madura, PA-C  methocarbamol (ROBAXIN) 500 MG tablet Take 1 tablet (500 mg total) by mouth every 12 (twelve) hours as needed for muscle spasms. 09/04/19   Antony Madura, PA-C  omeprazole (PRILOSEC) 20 MG capsule Take 1 capsule (20 mg total) by mouth daily. Patient not taking: Reported on 02/14/2019 06/24/18 02/14/19  Roxy Horseman, PA-C    Allergies    Patient has no known allergies.  Review of Systems   Review of Systems  Constitutional: Negative for appetite change, diaphoresis, fatigue, fever and unexpected  weight change.  HENT: Negative for mouth sores.   Eyes: Negative for visual disturbance.  Respiratory: Positive for shortness of breath. Negative for cough, chest tightness and wheezing.   Cardiovascular: Negative for chest pain.  Gastrointestinal: Negative for abdominal pain, constipation, diarrhea, nausea and vomiting.  Endocrine: Negative for polydipsia, polyphagia and polyuria.  Genitourinary: Negative for dysuria, frequency, hematuria and urgency.  Musculoskeletal: Negative for back pain and neck stiffness.  Skin: Positive for rash.  Allergic/Immunologic: Negative for immunocompromised state.  Neurological: Negative for syncope, light-headedness and headaches.  Hematological: Does not bruise/bleed easily.  Psychiatric/Behavioral: Negative for sleep disturbance. The patient is not nervous/anxious.     Physical Exam Updated Vital Signs BP 121/87 (BP Location: Right Arm)    Pulse 83    Temp 97.8 F (36.6 C) (Oral)    Resp (!) 22    Ht 6\' 1"  (1.854 m)    Wt 85 kg    SpO2 97%    BMI 24.72 kg/m   Physical Exam Vitals and nursing note reviewed.  Constitutional:      General: He is not in acute distress.    Appearance: He is not diaphoretic.  HENT:     Head: Normocephalic.  Eyes:     General: No scleral icterus.    Conjunctiva/sclera: Conjunctivae normal.  Cardiovascular:     Rate and Rhythm: Normal rate and regular rhythm.     Pulses: Normal pulses.          Radial pulses are 2+ on the right side and 2+ on the left side.     Heart sounds: No murmur.     Comments: No audible murmur Pulmonary:     Effort: Pulmonary effort is normal. No tachypnea, accessory muscle usage, prolonged expiration, respiratory distress or retractions.     Breath sounds: Normal breath sounds. No stridor.     Comments: Equal chest rise. No increased work of breathing. Clear and equal breath sounds Abdominal:     General: There is no distension.     Palpations: Abdomen is soft.     Tenderness: There  is no abdominal tenderness. There is no guarding or rebound.  Musculoskeletal:     Cervical back: Normal range of motion.     Comments: Moves all extremities equally and without difficulty.  Skin:    General: Skin is warm and dry.     Capillary Refill: Capillary refill takes less than 2 seconds.     Comments: Full erythematous pustules across the chest and anterior thighs. No track marks noted  Neurological:     Mental Status: He is alert.     GCS: GCS eye subscore is 4. GCS verbal subscore is 5. GCS motor subscore is 6.     Comments: Speech is clear and goal oriented.  Psychiatric:        Mood and Affect: Mood normal.     ED Results / Procedures / Treatments  Labs (all labs ordered are listed, but only abnormal results are displayed) Labs Reviewed  CBC WITH DIFFERENTIAL/PLATELET - Abnormal; Notable for the following components:      Result Value   Neutro Abs 1.3 (*)    All other components within normal limits  BASIC METABOLIC PANEL - Abnormal; Notable for the following components:   Potassium 3.1 (*)    All other components within normal limits  NOVEL CORONAVIRUS, NAA (HOSP ORDER, SEND-OUT TO REF LAB; TAT 18-24 HRS)  D-DIMER, QUANTITATIVE (NOT AT [facility])  TROPONIN I (HIGH SENSITIVITY)  TROPONIN I (HIGH SENSITIVITY)    EKG EKG Interpretation  Date/Time:  Friday September 13 2019 23:17:28 EST Ventricular Rate:  80 PR Interval:  162 QRS Duration: 112 QT Interval:  404 QTC Calculation: 465 R Axis:   94 Text Interpretation: Normal sinus rhythm Rightward axis Incomplete right bundle branch block Biventricular hypertrophy Abnormal ECG No acute changes Confirmed by Drema Pry [phone]) on 09/14/2019 3:01:20 AM     Radiology DG Chest 2 View  Result Date: 09/13/2019 CLINICAL DATA:  Chronic shortness of breath for several months, history of drug abuse, HIV EXAM: CHEST - 2 VIEW COMPARISON:  07/14/2019 FINDINGS: Frontal and lateral views of the chest demonstrate a stable  cardiac silhouette. No airspace disease, effusion, or pneumothorax. Stable right convex scoliosis. IMPRESSION: 1. No acute intrathoracic process. Electronically Signed   By: Sharlet Salina M.D.   On: 09/13/2019 23:46    Procedures Procedures (including critical care time)  Medications Ordered in ED Medications  albuterol (VENTOLIN HFA) 108 (90 Base) MCG/ACT inhaler 2 puff (has no administration in time range)  potassium chloride SA (KLOR-CON) CR tablet 40 mEq (40 mEq Oral Given 09/14/19 0039)    ED Course  I have reviewed the triage vital signs and the nursing notes.  Pertinent labs & imaging results that were available during my care of the patient were reviewed by me and considered in my medical decision making (see chart for details).  Clinical Course as of Sep 13 405  Sat Sep 14, 2019  0024 Mild tachypnea  Resp(!): 22 [HM]  0025 Hypokalemia noted  Potassium(!): 3.1 [HM]    Clinical Course User Index [HM] Kevina Piloto, Boyd Kerbs   MDM Rules/Calculators/A&P                       Presents with several complaints.  He complains of rash on his chest and anterior thighs.  Red papules and pustules consistent with folliculitis.  Patient does shave his chest.  Denies getting in a hot tub or anyone else with a rash.  No rash noted to his arms, back.  Not consistent with scabies or bedbugs.  Will give clindamycin gel for this.  Patient does have other lesions scattered on his face arms and legs that are most consistent with picking type behaviors seen with methamphetamine usage.  Additionally, patient complains of dyspnea on exertion.  Work-up here in the emergency department is reassuring.  EKG is without ischemia and troponin is negative.  Highly doubt acute coronary syndrome.  Patient is at low risk for pulmonary embolism and D-dimer is negative.  He is without tachycardia or hypoxia.  No associated symptoms with Covid however outpatient Covid test is being sent.  Chest x-ray without  evidence of consolidation, pneumothorax or pulmonary edema.  No peripheral edema.  No clinical evidence of PCP pneumonia.  Patient is afebrile here with vital signs within normal limits.  No evidence of sirs  or sepsis.  Will give albuterol inhaler for shortness of breath as patient is smoking a number of things and this may be contributing to his shortness of breath.  He has no wheezing here in the emergency room, but is also without shortness of breath here.  The patient was discussed with Dr. Eudelia Bunch who agrees with the treatment plan.  Patient is HIV positive.  I discussed this with him again.  It is important that he be seen by infectious disease this week.  I have referred him to the clinic and discussed the importance of this follow-up.  Patient states understanding.  BP 121/87 (BP Location: Right Arm)    Pulse 83    Temp 97.8 F (36.6 C) (Oral)    Resp 11    Ht 6\' 1"  (1.854 m)    Wt 85 kg    SpO2 100%    BMI 24.72 kg/m    [name] was evaluated in Emergency Department on 09/14/2019 for the symptoms described in the history of present illness. He was evaluated in the context of the global COVID-19 pandemic, which necessitated consideration that the patient might be at risk for infection with the SARS-CoV-2 virus that causes COVID-19. Institutional protocols and algorithms that pertain to the evaluation of patients at risk for COVID-19 are in a state of rapid change based on information released by regulatory bodies including the CDC and federal and state organizations. These policies and algorithms were followed during the patient's care in the ED.  Final Clinical Impression(s) / ED Diagnoses Final diagnoses:  Shortness of breath  Folliculitis    Rx / DC Orders ED Discharge Orders         Ordered    clindamycin (CLEOCIN T) 1 % lotion  2 times daily     09/14/19 0400           Tong Pieczynski, 09/16/19 09/14/19 0407    09/16/19, MD 09/15/19 [phone]

## 2019-09-14 NOTE — Discharge Instructions (Signed)
1. Medications: clindamycin gel, albuterol, usual home medications 2. Treatment: rest, drink plenty of fluids, 3. Follow Up: Please followup with infectious disease in 3-5 days for discussion of your diagnoses and further evaluation after today's visit; if you do not have a primary care doctor use the resource guide provided to find one; Please return to the ER for new or worsening symptoms

## 2019-09-17 ENCOUNTER — Telehealth: Payer: Self-pay | Admitting: *Deleted

## 2019-09-17 NOTE — Telephone Encounter (Signed)
-----   Message from Gardiner Barefoot, MD sent at 09/14/2019 10:39 AM EST ----- Patient previously referred to [facility]. He is aware of his diagnosis.  Maybe [facility]? thanks

## 2019-09-17 NOTE — Telephone Encounter (Signed)
Claris Che, can you please reach out to Rol[facility]ala and offer new patient appointments Barrister's clerk, Lab, Provider, Pharmacy)?  If he does not answer, will send to [facility]. Thank you! Andree Coss, RN

## 2019-09-18 NOTE — Telephone Encounter (Signed)
[name] unable to reach on 3/2. RN called patient 3/3, left voicemail asking him to call [facility] regarding his ER follow up. Will also refer to [facility]. Andree Coss, RN

## 2019-09-22 ENCOUNTER — Encounter (HOSPITAL_COMMUNITY): Payer: Self-pay | Admitting: *Deleted

## 2019-09-22 ENCOUNTER — Emergency Department (HOSPITAL_COMMUNITY)
Admission: EM | Admit: 2019-09-22 | Discharge: 2019-09-22 | Disposition: A | Discharge status: Home / Self Care | Payer: Self-pay | Attending: Emergency Medicine | Admitting: Emergency Medicine

## 2019-09-22 ENCOUNTER — Other Ambulatory Visit: Payer: Self-pay

## 2019-09-22 DIAGNOSIS — M549 Dorsalgia, unspecified: Secondary | ICD-10-CM | POA: Insufficient documentation

## 2019-09-22 DIAGNOSIS — Z5321 Procedure and treatment not carried out due to patient leaving prior to being seen by health care provider: Secondary | ICD-10-CM | POA: Insufficient documentation

## 2019-09-22 NOTE — ED Triage Notes (Signed)
Per security, the patient was asked to leave premesis because he walked in and came in to charge his phone. When asked about his back pain today, "I have not a clue, it just hurts I guess, you can put me at the end of the list, I had to get the bracelet so I could call an uber"

## 2019-09-23 NOTE — Telephone Encounter (Signed)
[name] received [employer] referral, followed up with [place]. Per [name], [place] [facility] has reached out multiple times to get him into care, but he has refused.  Patient has been closed by [place] [facility] 09/13/19.  Patient has been offered services and care multiple times, knows where and who to contact when he is ready to start treatment.  Andree Coss, RN

## 2019-09-26 ENCOUNTER — Emergency Department (HOSPITAL_COMMUNITY)
Admission: EM | Admit: 2019-09-26 | Discharge: 2019-09-26 | Disposition: A | Discharge status: Home / Self Care | Payer: Self-pay | Attending: Emergency Medicine | Admitting: Emergency Medicine

## 2019-09-26 ENCOUNTER — Other Ambulatory Visit: Payer: Self-pay

## 2019-09-26 ENCOUNTER — Encounter (HOSPITAL_COMMUNITY): Payer: Self-pay

## 2019-09-26 DIAGNOSIS — T148XXA Other injury of unspecified body region, initial encounter: Secondary | ICD-10-CM

## 2019-09-26 DIAGNOSIS — Y939 Activity, unspecified: Secondary | ICD-10-CM | POA: Insufficient documentation

## 2019-09-26 DIAGNOSIS — X58XXXA Exposure to other specified factors, initial encounter: Secondary | ICD-10-CM | POA: Insufficient documentation

## 2019-09-26 DIAGNOSIS — Y929 Unspecified place or not applicable: Secondary | ICD-10-CM | POA: Insufficient documentation

## 2019-09-26 DIAGNOSIS — F17210 Nicotine dependence, cigarettes, uncomplicated: Secondary | ICD-10-CM | POA: Insufficient documentation

## 2019-09-26 DIAGNOSIS — F909 Attention-deficit hyperactivity disorder, unspecified type: Secondary | ICD-10-CM | POA: Insufficient documentation

## 2019-09-26 DIAGNOSIS — S29012A Strain of muscle and tendon of back wall of thorax, initial encounter: Secondary | ICD-10-CM | POA: Insufficient documentation

## 2019-09-26 DIAGNOSIS — Z79899 Other long term (current) drug therapy: Secondary | ICD-10-CM | POA: Insufficient documentation

## 2019-09-26 DIAGNOSIS — Y999 Unspecified external cause status: Secondary | ICD-10-CM | POA: Insufficient documentation

## 2019-09-26 DIAGNOSIS — Z21 Asymptomatic human immunodeficiency virus [HIV] infection status: Secondary | ICD-10-CM | POA: Insufficient documentation

## 2019-09-26 MED ORDER — ACETAMINOPHEN 500 MG PO TABS
1000.0000 mg | ORAL_TABLET | Freq: Once | ORAL | Status: AC
  Administered 2019-09-26: 1000 mg via ORAL
  Filled 2019-09-26: qty 2

## 2019-09-26 MED ORDER — LIDOCAINE 5 % EX PTCH
1.0000 | MEDICATED_PATCH | CUTANEOUS | Status: DC
  Administered 2019-09-26: 1 via TRANSDERMAL
  Filled 2019-09-26: qty 1

## 2019-09-26 NOTE — ED Notes (Signed)
Pt cursing at staff because we do not have a bus pass for him. He states, "how am I supposed to leave?" "Im just going to sit in the lobby." Pt made aware that due to the COVID 19 pandemic, patients may not wait in the lobby after they are discharged. Ambulatory out of ED.

## 2019-09-26 NOTE — ED Notes (Signed)
Pt ambulatory in triage. 

## 2019-09-26 NOTE — ED Notes (Signed)
Pt given warm blanket, per request.

## 2019-09-26 NOTE — ED Provider Notes (Signed)
[facility]-EMERGENCY DEPT Provider Note   CSN: [id number] Arrival date & time: 09/26/19  0547     History Chief Complaint  Patient presents with  . Back Pain    [name] is a 34 y.o. male.  The history is provided by the patient.  Back Pain Location:  Thoracic spine Quality:  Aching Radiates to:  Does not radiate Pain severity:  Moderate Pain is:  Same all the time Onset quality:  Gradual Timing:  Constant Progression:  Unchanged Chronicity:  New Context: not emotional stress, not falling, not jumping from heights, not lifting heavy objects, not MCA, not MVA, not occupational injury, not pedestrian accident, not physical stress, not recent illness, not recent injury and not twisting   Relieved by:  Nothing Worsened by:  Nothing Ineffective treatments:  None tried Associated symptoms: no abdominal pain, no abdominal swelling, no bladder incontinence, no bowel incontinence, no chest pain, no dysuria, no fever, no headaches, no leg pain, no numbness, no paresthesias, no pelvic pain, no perianal numbness, no tingling, no weakness and no weight loss   Risk factors: no hx of cancer   Chronic back pain, seen for same on multiple occasions with negative work up including negative ddimer.  No f/c/r.  No weakness no numbness.       Past Medical History:  Diagnosis Date  . ADHD (attention deficit hyperactivity disorder)   . Drug abuse ([facility])    meth, marijuana  . HIV positive [facility]     Patient Active Problem List   Diagnosis Date Noted  . Methamphetamine use disorder, severe, dependence ([facility]) 07/10/2019  . 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 HIV (human immunodeficiency virus infection) ([facility]) 06/05/2019    History reviewed. No pertinent surgical history.     Family History  Problem Relation Age of Onset  . Diabetes Mother   . Hypertension  Mother   . Cancer Mother   . Cancer Father   . Diabetes Father   . Hypertension Father     Social History   Tobacco Use  . Smoking status: Current Every Day Smoker    Packs/day: 1.00    Years: 10.00    Pack years: 10.00    Types: Cigarettes  . Smokeless tobacco: Never Used  Substance Use Topics  . Alcohol use: Yes  . Drug use: Yes    Types: Marijuana, Methamphetamines    Comment: last used 4 days ago    Home Medications Prior to Admission medications   Medication Sig Start Date End Date Taking? Authorizing Provider  benzonatate (TESSALON PERLES) 100 MG capsule Take 1 capsule (100 mg total) by mouth 3 (three) times daily as needed for cough (cough). 07/14/19   Muthersbaugh, Dahlia Client, PA-C  clindamycin (CLEOCIN T) 1 % lotion Apply topically 2 (two) times daily. 09/14/19   Muthersbaugh, Dahlia Client, PA-C  lidocaine (LIDODERM) 5 % Place 1 patch onto the skin daily. Remove & Discard patch within 12 hours or as directed by MD 09/04/19   Antony Madura, PA-C  methocarbamol (ROBAXIN) 500 MG tablet Take 1 tablet (500 mg total) by mouth every 12 (twelve) hours as needed for muscle spasms. 09/04/19   Antony Madura, PA-C  omeprazole (PRILOSEC) 20 MG capsule Take 1 capsule (20 mg total) by mouth daily. Patient not taking: Reported on 02/14/2019 06/24/18 02/14/19  Roxy Horseman, PA-C    Allergies    Patient has no known allergies.  Review of  Systems   Review of Systems  Constitutional: Negative for fever and weight loss.  HENT: Negative for congestion.   Eyes: Negative for visual disturbance.  Respiratory: Negative for cough and shortness of breath.   Cardiovascular: Negative for chest pain.  Gastrointestinal: Negative for abdominal pain and bowel incontinence.  Genitourinary: Negative for bladder incontinence, dysuria and pelvic pain.  Musculoskeletal: Positive for back pain.  Neurological: Negative for tingling, weakness, numbness, headaches and paresthesias.  Psychiatric/Behavioral: Negative  for agitation.  All other systems reviewed and are negative.   Physical Exam Updated Vital Signs There were no vitals taken for this visit.  Physical Exam Vitals and nursing note reviewed.  Constitutional:      General: He is not in acute distress.    Appearance: He is normal weight.  HENT:     Head: Normocephalic and atraumatic.     Nose: Nose normal.  Eyes:     Conjunctiva/sclera: Conjunctivae normal.     Pupils: Pupils are equal, round, and reactive to light.  Cardiovascular:     Rate and Rhythm: Normal rate and regular rhythm.     Pulses: Normal pulses.     Heart sounds: Normal heart sounds.  Pulmonary:     Effort: Pulmonary effort is normal.     Breath sounds: Normal breath sounds.  Abdominal:     General: Abdomen is flat. Bowel sounds are normal.     Tenderness: There is no abdominal tenderness. There is no guarding.  Musculoskeletal:        General: Normal range of motion.     Cervical back: Normal, normal range of motion and neck supple.     Thoracic back: Normal.     Lumbar back: Normal.     Comments: Normal gate.  Has used the bathroom without difficulty.  Able to carry multiple bags without difficulty.    Skin:    General: Skin is warm and dry.     Capillary Refill: Capillary refill takes less than 2 seconds.  Neurological:     General: No focal deficit present.     Mental Status: He is alert and oriented to person, place, and time.     ED Results / Procedures / Treatments   Labs (all labs ordered are listed, but only abnormal results are displayed) Labs Reviewed - No data to display  EKG None  Radiology No results found.  Procedures Procedures (including critical care time)  Medications Ordered in ED Medications  acetaminophen (TYLENOL) tablet 1,000 mg (has no administration in time range)  lidocaine (LIDODERM) 5 % 1 patch (has no administration in time range)    ED Course  I have reviewed the triage vital signs and the nursing  notes.  Pertinent labs & imaging results that were available during my care of the patient were reviewed by me and considered in my medical decision making (see chart for details).    See for same with multiple negative work ups.  No IVDA.  No weakness no numbness.  No indication for advanced imaging at this time.    [name] was evaluated in Emergency Department on 09/26/2019 for the symptoms described in the history of present illness. He was evaluated in the context of the global COVID-19 pandemic, which necessitated consideration that the patient might be at risk for infection with the SARS-CoV-2 virus that causes COVID-19. Institutional protocols and algorithms that pertain to the evaluation of patients at risk for COVID-19 are in a state of rapid change based on information  released by regulatory bodies including the CDC and federal and state organizations. These policies and algorithms were followed during the patient's care in the ED.  Final Clinical Impression(s) / ED Diagnoses Return for weakness, numbness, changes in vision or speech, fevers >100.4 unrelieved by medication, shortness of breath, intractable vomiting, or diarrhea, abdominal pain, Inability to tolerate liquids or food, cough, altered mental status or any concerns. No signs of systemic illness or infection. The patient is nontoxic-appearing on exam and vital signs are within normal limits.   I have reviewed the triage vital signs and the nursing notes. Pertinent labs &imaging results that were available during my care of the patient were reviewed by me and considered in my medical decision making (see chart for details).  After history, exam, and medical workup I feel the patient has been appropriately medically screened and is safe for discharge home. Pertinent diagnoses were discussed with the patient. Patient was given return precautions    Leanore Biggers, MD 09/26/19 [phone]

## 2019-09-26 NOTE — ED Notes (Signed)
Pt requesting bus pass at discharge. When pt was informed there were no bus passes to give, pt got upset and began cussing at staff. Pt asked, "how am I supposed to leave? I was asleep on a bus bench". Pt states he will wait in the lobby if no one will give him a bus pass; pt informed waiting in the lobby is not allowed if he is not a pt due to covid restrictions. Pt ambulated out of ED.

## 2019-09-26 NOTE — ED Triage Notes (Signed)
Pt presents with several bags of belongings. He reports chronic back pain. Denies hematuria or recent injury.

## 2019-09-26 NOTE — ED Notes (Signed)
Pt refuses to wear a mask.

## 2019-09-26 NOTE — ED Notes (Signed)
Pt ambulatory to room. Carrying 4 bags without assistance.

## 2019-10-09 ENCOUNTER — Other Ambulatory Visit: Payer: Self-pay | Admitting: *Deleted

## 2019-10-09 ENCOUNTER — Ambulatory Visit: Payer: Self-pay

## 2019-10-09 ENCOUNTER — Encounter: Payer: Self-pay | Admitting: Internal Medicine

## 2019-10-09 ENCOUNTER — Other Ambulatory Visit: Payer: Self-pay

## 2019-10-09 ENCOUNTER — Other Ambulatory Visit: Payer: Self-pay | Admitting: Internal Medicine

## 2019-10-09 DIAGNOSIS — B20 Human immunodeficiency virus [HIV] disease: Secondary | ICD-10-CM

## 2019-10-09 DIAGNOSIS — Z113 Encounter for screening for infections with a predominantly sexual mode of transmission: Secondary | ICD-10-CM

## 2019-10-09 DIAGNOSIS — Z79899 Other long term (current) drug therapy: Secondary | ICD-10-CM

## 2019-10-10 LAB — T-HELPER CELL (CD4) - (RCID CLINIC ONLY)
CD4 % Helper T Cell: 25 % — ABNORMAL LOW (ref 33–65)
CD4 T Cell Abs: 606 /uL (ref 400–1790)

## 2019-10-10 LAB — URINE CYTOLOGY ANCILLARY ONLY
Chlamydia: NEGATIVE
Comment: NEGATIVE
Comment: NORMAL
Neisseria Gonorrhea: NEGATIVE

## 2019-10-10 LAB — URINALYSIS
Bilirubin Urine: NEGATIVE
Glucose, UA: NEGATIVE
Hgb urine dipstick: NEGATIVE
Leukocytes,Ua: NEGATIVE
Nitrite: NEGATIVE
Specific Gravity, Urine: 1.038 — ABNORMAL HIGH (ref 1.001–1.03)
pH: 5.5 (ref 5.0–8.0)

## 2019-10-11 ENCOUNTER — Telehealth: Payer: Self-pay | Admitting: *Deleted

## 2019-10-11 NOTE — Telephone Encounter (Signed)
Patient walked into clinic to establish care after multiple attempts to engage him in care.  He met with Financial Counselor who identified he had needs, connected patient with [facility].  Patient had labs drawn, asked if he could speak with a nurse. RN spoke with patient, introduced [facility] and gave a brief overview of HIV management and care. He would like to speak with someone regarding drug treatment options, more intense case management.  RN advised I would connect him to [facility].  He has not yet met with the state DIS, so RN will send contact information to the health department as well. Patient confirms his phone number per chart. Landis Gandy, RN

## 2019-10-19 LAB — CBC WITH DIFFERENTIAL/PLATELET
Absolute Monocytes: 400 cells/uL (ref 200–950)
Basophils Absolute: 31 cells/uL (ref 0–200)
Basophils Relative: 0.7 %
Eosinophils Absolute: 431 cells/uL (ref 15–500)
Eosinophils Relative: 9.8 %
HCT: 43.1 % (ref 38.5–50.0)
Hemoglobin: 14.6 g/dL (ref 13.2–17.1)
Lymphs Abs: 2486 cells/uL (ref 850–3900)
MCH: 29.7 pg (ref 27.0–33.0)
MCHC: 33.9 g/dL (ref 32.0–36.0)
MCV: 87.8 fL (ref 80.0–100.0)
MPV: 9.6 fL (ref 7.5–12.5)
Monocytes Relative: 9.1 %
Neutro Abs: 1052 cells/uL — ABNORMAL LOW (ref 1500–7800)
Neutrophils Relative %: 23.9 %
Platelets: 261 10*3/uL (ref 140–400)
RBC: 4.91 10*6/uL (ref 4.20–5.80)
RDW: 14.1 % (ref 11.0–15.0)
Total Lymphocyte: 56.5 %
WBC: 4.4 10*3/uL (ref 3.8–10.8)

## 2019-10-19 LAB — HEPATITIS B CORE ANTIBODY, TOTAL: Hep B Core Total Ab: NONREACTIVE

## 2019-10-19 LAB — COMPLETE METABOLIC PANEL WITH GFR
AG Ratio: 1.1 (calc) (ref 1.0–2.5)
ALT: 41 U/L (ref 9–46)
AST: 29 U/L (ref 10–40)
Albumin: 4.1 g/dL (ref 3.6–5.1)
Alkaline phosphatase (APISO): 56 U/L (ref 36–130)
BUN: 11 mg/dL (ref 7–25)
CO2: 32 mmol/L (ref 20–32)
Calcium: 8.8 mg/dL (ref 8.6–10.3)
Chloride: 101 mmol/L (ref 98–110)
Creat: 0.88 mg/dL (ref 0.60–1.35)
GFR, Est African American: 130 mL/min/{1.73_m2} (ref >=60)
GFR, Est Non African American: 112 mL/min/{1.73_m2} (ref >=60)
Globulin: 3.7 g/dL (calc) (ref 1.9–3.7)
Glucose, Bld: 89 mg/dL (ref 65–99)
Potassium: 3.5 mmol/L (ref 3.5–5.3)
Sodium: 137 mmol/L (ref 135–146)
Total Bilirubin: 0.3 mg/dL (ref 0.2–1.2)
Total Protein: 7.8 g/dL (ref 6.1–8.1)

## 2019-10-19 LAB — QUANTIFERON-TB GOLD PLUS
Mitogen-NIL: 7.76 IU/mL
NIL: 0.06 IU/mL
QuantiFERON-TB Gold Plus: NEGATIVE
TB1-NIL: <0 IU/mL
TB2-NIL: 0.01 IU/mL

## 2019-10-19 LAB — HIV ANTIBODY (ROUTINE TESTING W REFLEX): HIV 1&2 Ab, 4th Generation: REACTIVE — AB

## 2019-10-19 LAB — LIPID PANEL
Cholesterol: 115 mg/dL (ref <200)
HDL: 33 mg/dL — ABNORMAL LOW (ref >=40)
LDL Cholesterol (Calc): 63 mg/dL (calc)
Non-HDL Cholesterol (Calc): 82 mg/dL (calc) (ref <130)
Total CHOL/HDL Ratio: 3.5 (calc) (ref <5.0)
Triglycerides: 103 mg/dL (ref <150)

## 2019-10-19 LAB — HIV-1 RNA ULTRAQUANT REFLEX TO GENTYP+
HIV 1 RNA Quant: 248000 copies/mL — ABNORMAL HIGH
HIV-1 RNA Quant, Log: 5.39 Log copies/mL — ABNORMAL HIGH

## 2019-10-19 LAB — HEPATITIS A ANTIBODY, TOTAL: Hepatitis A AB,Total: NONREACTIVE

## 2019-10-19 LAB — RPR TITER: RPR Titer: 1:4 {titer} — ABNORMAL HIGH

## 2019-10-19 LAB — RPR: RPR Ser Ql: REACTIVE — AB

## 2019-10-19 LAB — HEPATITIS B SURFACE ANTIBODY,QUALITATIVE: Hep B S Ab: REACTIVE — AB

## 2019-10-19 LAB — HLA B*5701: HLA-B*5701 w/rflx HLA-B High: NEGATIVE

## 2019-10-19 LAB — HEPATITIS C ANTIBODY
Hepatitis C Ab: REACTIVE — AB
SIGNAL TO CUT-OFF: 33 — ABNORMAL HIGH (ref <1.00)

## 2019-10-19 LAB — FLUORESCENT TREPONEMAL AB(FTA)-IGG-BLD: Fluorescent Treponemal ABS: REACTIVE — AB

## 2019-10-19 LAB — HCV RNA,QUANTITATIVE REAL TIME PCR
HCV Quantitative Log: 5.92 Log IU/mL — ABNORMAL HIGH
HCV RNA, PCR, QN: 838000 IU/mL — ABNORMAL HIGH

## 2019-10-19 LAB — HEPATITIS B SURFACE ANTIGEN: Hepatitis B Surface Ag: NONREACTIVE

## 2019-10-19 LAB — HIV-1/2 AB - DIFFERENTIATION
HIV-1 antibody: POSITIVE — AB
HIV-2 Ab: NEGATIVE

## 2019-10-19 LAB — HIV-1 GENOTYPE: HIV-1 Genotype: DETECTED — AB

## 2019-10-23 ENCOUNTER — Telehealth: Payer: Self-pay | Admitting: Pharmacy Technician

## 2019-10-23 ENCOUNTER — Ambulatory Visit: Payer: Self-pay | Admitting: Pharmacist

## 2019-10-23 ENCOUNTER — Ambulatory Visit: Payer: Self-pay | Admitting: Internal Medicine

## 2019-10-23 NOTE — Telephone Encounter (Signed)
[facility] Patient Product/process development scientist completed.    The patient is insured through [employer].  Any needed specialty medication will need a prior authorization before being able to fill.  We will continue to follow to see if copay assistance is needed.  Netty Starring. Dimas Aguas CPhT Specialty Pharmacy Patient [facility] Phone: [phone] Fax:  [phone]

## 2019-11-09 ENCOUNTER — Emergency Department (HOSPITAL_COMMUNITY): Admission: EM | Admit: 2019-11-09 | Discharge: 2019-11-09 | Discharge status: Against Medical Advice | Payer: Self-pay

## 2019-11-09 NOTE — ED Notes (Signed)
No answer to call x1

## 2019-11-09 NOTE — ED Notes (Signed)
No answer to call x2

## 2019-12-04 ENCOUNTER — Emergency Department (HOSPITAL_COMMUNITY): Admission: EM | Admit: 2019-12-04 | Discharge: 2019-12-04 | Discharge status: Absent without leave | Payer: Self-pay

## 2019-12-04 NOTE — ED Triage Notes (Signed)
Per EMS, was at the [facility]-admitted to using meth 3 hours ago-wants detox so he can get the help he needs

## 2019-12-20 ENCOUNTER — Inpatient Hospital Stay (HOSPITAL_COMMUNITY)
Admission: EM | Admit: 2019-12-20 | Discharge: 2019-12-22 | DRG: 894 | Discharge status: Against Medical Advice | Payer: Self-pay | Attending: Internal Medicine | Admitting: Internal Medicine

## 2019-12-20 ENCOUNTER — Encounter (HOSPITAL_COMMUNITY): Payer: Self-pay

## 2019-12-20 DIAGNOSIS — Z20822 Contact with and (suspected) exposure to covid-19: Secondary | ICD-10-CM | POA: Diagnosis present

## 2019-12-20 DIAGNOSIS — F15929 Other stimulant use, unspecified with intoxication, unspecified: Secondary | ICD-10-CM | POA: Diagnosis present

## 2019-12-20 DIAGNOSIS — E876 Hypokalemia: Secondary | ICD-10-CM | POA: Diagnosis present

## 2019-12-20 DIAGNOSIS — Z21 Asymptomatic human immunodeficiency virus [HIV] infection status: Secondary | ICD-10-CM | POA: Diagnosis present

## 2019-12-20 DIAGNOSIS — R739 Hyperglycemia, unspecified: Secondary | ICD-10-CM | POA: Diagnosis present

## 2019-12-20 DIAGNOSIS — M6282 Rhabdomyolysis: Secondary | ICD-10-CM | POA: Diagnosis present

## 2019-12-20 DIAGNOSIS — F15229 Other stimulant dependence with intoxication, unspecified: Principal | ICD-10-CM | POA: Diagnosis present

## 2019-12-20 DIAGNOSIS — F1510 Other stimulant abuse, uncomplicated: Secondary | ICD-10-CM

## 2019-12-20 DIAGNOSIS — F12129 Cannabis abuse with intoxication, unspecified: Secondary | ICD-10-CM | POA: Diagnosis present

## 2019-12-20 DIAGNOSIS — N179 Acute kidney failure, unspecified: Secondary | ICD-10-CM | POA: Diagnosis present

## 2019-12-20 DIAGNOSIS — B20 Human immunodeficiency virus [HIV] disease: Secondary | ICD-10-CM | POA: Diagnosis present

## 2019-12-20 DIAGNOSIS — F11129 Opioid abuse with intoxication, unspecified: Secondary | ICD-10-CM | POA: Diagnosis present

## 2019-12-20 DIAGNOSIS — R748 Abnormal levels of other serum enzymes: Secondary | ICD-10-CM | POA: Insufficient documentation

## 2019-12-20 DIAGNOSIS — D72829 Elevated white blood cell count, unspecified: Secondary | ICD-10-CM | POA: Diagnosis present

## 2019-12-20 DIAGNOSIS — F1520 Other stimulant dependence, uncomplicated: Secondary | ICD-10-CM

## 2019-12-20 DIAGNOSIS — F17210 Nicotine dependence, cigarettes, uncomplicated: Secondary | ICD-10-CM | POA: Diagnosis present

## 2019-12-20 DIAGNOSIS — F419 Anxiety disorder, unspecified: Secondary | ICD-10-CM | POA: Diagnosis present

## 2019-12-20 DIAGNOSIS — Z79899 Other long term (current) drug therapy: Secondary | ICD-10-CM

## 2019-12-20 DIAGNOSIS — R7401 Elevation of levels of liver transaminase levels: Secondary | ICD-10-CM | POA: Diagnosis present

## 2019-12-20 LAB — COMPREHENSIVE METABOLIC PANEL
ALT: 100 U/L — ABNORMAL HIGH (ref 0–44)
AST: 134 U/L — ABNORMAL HIGH (ref 15–41)
Albumin: 4.6 g/dL (ref 3.5–5.0)
Alkaline Phosphatase: 63 U/L (ref 38–126)
Anion gap: 13 (ref 5–15)
BUN: 16 mg/dL (ref 6–20)
CO2: 26 mmol/L (ref 22–32)
Calcium: 9.3 mg/dL (ref 8.9–10.3)
Chloride: 102 mmol/L (ref 98–111)
Creatinine, Ser: 1.91 mg/dL — ABNORMAL HIGH (ref 0.61–1.24)
GFR calc Af Amer: 52 mL/min — ABNORMAL LOW (ref >60)
GFR calc non Af Amer: 45 mL/min — ABNORMAL LOW (ref >60)
Glucose, Bld: 107 mg/dL — ABNORMAL HIGH (ref 70–99)
Potassium: 3.9 mmol/L (ref 3.5–5.1)
Sodium: 141 mmol/L (ref 135–145)
Total Bilirubin: 0.5 mg/dL (ref 0.3–1.2)
Total Protein: 9.5 g/dL — ABNORMAL HIGH (ref 6.5–8.1)

## 2019-12-20 LAB — CK: Total CK: 2035 U/L — ABNORMAL HIGH (ref 49–397)

## 2019-12-20 LAB — CBC
HCT: 44.7 % (ref 39.0–52.0)
Hemoglobin: 15.3 g/dL (ref 13.0–17.0)
MCH: 29.9 pg (ref 26.0–34.0)
MCHC: 34.2 g/dL (ref 30.0–36.0)
MCV: 87.3 fL (ref 80.0–100.0)
Platelets: 254 10*3/uL (ref 150–400)
RBC: 5.12 MIL/uL (ref 4.22–5.81)
RDW: 13.9 % (ref 11.5–15.5)
WBC: 11.5 10*3/uL — ABNORMAL HIGH (ref 4.0–10.5)
nRBC: 0 % (ref 0.0–0.2)

## 2019-12-20 LAB — SARS CORONAVIRUS 2 BY RT PCR (HOSPITAL ORDER, PERFORMED IN ~~LOC~~ HOSPITAL LAB): SARS Coronavirus 2: NEGATIVE

## 2019-12-20 MED ORDER — SODIUM CHLORIDE 0.9% FLUSH
3.0000 mL | Freq: Two times a day (BID) | INTRAVENOUS | Status: DC
  Administered 2019-12-21: 3 mL via INTRAVENOUS

## 2019-12-20 MED ORDER — ACETAMINOPHEN 325 MG PO TABS: 650.0000 mg | ORAL_TABLET | Freq: Four times a day (QID) | ORAL | Status: DC | PRN

## 2019-12-20 MED ORDER — ONDANSETRON HCL 4 MG PO TABS: 4.0000 mg | ORAL_TABLET | Freq: Four times a day (QID) | ORAL | Status: DC | PRN

## 2019-12-20 MED ORDER — SODIUM CHLORIDE 0.9 % IV BOLUS
1000.0000 mL | Freq: Once | INTRAVENOUS | Status: AC
  Administered 2019-12-20: 1000 mL via INTRAVENOUS

## 2019-12-20 MED ORDER — SENNOSIDES-DOCUSATE SODIUM 8.6-50 MG PO TABS: 1.0000 | ORAL_TABLET | Freq: Every evening | ORAL | Status: DC | PRN

## 2019-12-20 MED ORDER — SODIUM CHLORIDE 0.9 % IV SOLN: INTRAVENOUS | Status: DC

## 2019-12-20 MED ORDER — ONDANSETRON HCL 4 MG/2ML IJ SOLN: 4.0000 mg | Freq: Four times a day (QID) | INTRAMUSCULAR | Status: DC | PRN

## 2019-12-20 MED ORDER — ACETAMINOPHEN 650 MG RE SUPP: 650.0000 mg | Freq: Four times a day (QID) | RECTAL | Status: DC | PRN

## 2019-12-20 MED ORDER — ENOXAPARIN SODIUM 40 MG/0.4ML ~~LOC~~ SOLN
40.0000 mg | SUBCUTANEOUS | Status: DC
  Administered 2019-12-20: 40 mg via SUBCUTANEOUS
  Filled 2019-12-20 (×2): qty 0.4

## 2019-12-20 MED ORDER — LORAZEPAM 2 MG/ML IJ SOLN
1.0000 mg | Freq: Once | INTRAMUSCULAR | Status: AC
  Administered 2019-12-20: 1 mg via INTRAVENOUS
  Filled 2019-12-20: qty 1

## 2019-12-20 MED ORDER — LORAZEPAM 2 MG/ML IJ SOLN
2.0000 mg | INTRAMUSCULAR | Status: DC | PRN
  Administered 2019-12-20: 2 mg via INTRAVENOUS
  Filled 2019-12-20: qty 1

## 2019-12-20 NOTE — ED Notes (Signed)
The pt reports that he is here to get off cystal meth that he has been doing for 4 yeaRS   He has had all the other drugs that have not helped  He sweating hyperventilating

## 2019-12-20 NOTE — ED Notes (Signed)
Report given to nicole on 4e

## 2019-12-20 NOTE — ED Provider Notes (Signed)
[name] EMERGENCY DEPARTMENT Provider Note   CSN: [id number] Arrival date & time: 12/20/19  1627     History Chief Complaint  Patient presents with  . Anxiety    [name] is a 34 y.o. male who presents emergency department chief complaint of methamphetamine intoxication.  He has a past medical history of methamphetamine and marijuana abuse.  Patient states that he was sober for 1 week but last night began smoking methamphetamine again.  He said he smoked it pretty much all night long.  He states that currently he "feels terrible and cannot stop moving."  He states that he feels "like a spider on roller skates."  He is not actively hallucinating.  He denies suicidal or homicidal ideation.  He is asking for help getting into rehab.  HPI     Past Medical History:  Diagnosis Date  . ADHD (attention deficit hyperactivity disorder)   . Drug abuse (HCC)    meth, marijuana  . HIV positive [facility])     Patient Active Problem List   Diagnosis Date Noted  . Methamphetamine use disorder, severe, dependence (HCC) 07/10/2019  . 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 HIV (human immunodeficiency virus infection) (HCC) 06/05/2019    History reviewed. No pertinent surgical history.     Family History  Problem Relation Age of Onset  . Diabetes Mother   . Hypertension Mother   . Cancer Mother   . Cancer Father   . Diabetes Father   . Hypertension Father     Social History   Tobacco Use  . Smoking status: Current Every Day Smoker    Packs/day: 1.00    Years: 10.00    Pack years: 10.00    Types: Cigarettes  . Smokeless tobacco: Never Used  Substance Use Topics  . Alcohol use: Yes  . Drug use: Yes    Types: Marijuana, Methamphetamines    Comment: last used 4 days ago    Home Medications Prior to Admission medications   Medication Sig Start Date  End Date Taking? Authorizing Provider  benzonatate (TESSALON PERLES) 100 MG capsule Take 1 capsule (100 mg total) by mouth 3 (three) times daily as needed for cough (cough). 07/14/19   Muthersbaugh, Dahlia Client, PA-C  clindamycin (CLEOCIN T) 1 % lotion Apply topically 2 (two) times daily. 09/14/19   Muthersbaugh, Dahlia Client, PA-C  lidocaine (LIDODERM) 5 % Place 1 patch onto the skin daily. Remove & Discard patch within 12 hours or as directed by MD 09/04/19   Antony Madura, PA-C  methocarbamol (ROBAXIN) 500 MG tablet Take 1 tablet (500 mg total) by mouth every 12 (twelve) hours as needed for muscle spasms. 09/04/19   Antony Madura, PA-C  omeprazole (PRILOSEC) 20 MG capsule Take 1 capsule (20 mg total) by mouth daily. Patient not taking: Reported on 02/14/2019 06/24/18 02/14/19  Roxy Horseman, PA-C    Allergies    Patient has no known allergies.  Review of Systems   Review of Systems Ten systems reviewed and are negative for acute change, except as noted in the HPI.   Physical Exam Updated Vital Signs BP 130/82   Pulse (!) 148   Resp (!) 22   Ht 6\' 1"  (1.854 m)   Wt 77 kg   SpO2 93%   BMI 22.40 kg/m   Physical Exam Vitals and nursing note reviewed.  Constitutional:      General: He is not in  acute distress.    Appearance: He is well-developed. He is diaphoretic.  HENT:     Head: Normocephalic and atraumatic.  Eyes:     General: No scleral icterus.    Extraocular Movements: Extraocular movements intact.     Conjunctiva/sclera: Conjunctivae normal.     Pupils: Pupils are equal, round, and reactive to light.  Cardiovascular:     Rate and Rhythm: Regular rhythm. Tachycardia present.     Heart sounds: Normal heart sounds.  Pulmonary:     Effort: Pulmonary effort is normal. No respiratory distress.     Breath sounds: Normal breath sounds.  Abdominal:     Palpations: Abdomen is soft.     Tenderness: There is no abdominal tenderness.  Musculoskeletal:     Cervical back: Normal range of  motion and neck supple.  Skin:    General: Skin is warm.  Neurological:     Mental Status: He is alert and oriented to person, place, and time.     Comments: Patient with akathisia, unable to sit still  Psychiatric:        Behavior: Behavior normal.     ED Results / Procedures / Treatments   Labs (all labs ordered are listed, but only abnormal results are displayed) Labs Reviewed  CK  CBC  COMPREHENSIVE METABOLIC PANEL  URINALYSIS, ROUTINE W REFLEX MICROSCOPIC    EKG EKG Interpretation  Date/Time:  Friday December 20 2019 16:54:37 EDT Ventricular Rate:  134 PR Interval:    QRS Duration: 103 QT Interval:  306 QTC Calculation: 457 R Axis:   108 Text Interpretation: Sinus tachycardia LAE, consider biatrial enlargement Right ventricular hypertrophy LVH by voltage Baseline wander in lead(s) III V2 Since last tracing rate faster Confirmed by Linwood Dibbles [phone]) on 12/20/2019 4:56:38 PM   Radiology No results found.  Procedures Procedures (including critical care time)  Medications Ordered in ED Medications  sodium chloride 0.9 % bolus 1,000 mL (has no administration in time range)  LORazepam (ATIVAN) injection 1 mg (has no administration in time range)    ED Course  I have reviewed the triage vital signs and the nursing notes.  Pertinent labs & imaging results that were available during my care of the patient were reviewed by me and considered in my medical decision making (see chart for details).    MDM Rules/Calculators/A&P                      WE:[id number] abuse VS: BP 131/81 (BP Location: Right Arm)   Pulse (!) 109   Temp 99.3 F (37.4 C) (Oral)   Resp 17   Ht 6\' 1"  (1.854 m)   Wt 77 kg   SpO2 98%   BMI 22.40 kg/m   is gathered by patient  and emr. Previous records obtained and reviewed. ddx includes other sympathomimetic ingestions- cocaine, acs, other toxidrome Labs: I ordered reviewed and interpreted labs which include Covid test which  is negative.  CMP which shows elevated blood glucose, acute kidney injury, elevated LFTs, CK elevated at [id number].TO:[id number]  CBC shows elevated white blood cell count. Imaging: EKG: Sinus tachycardia at a rate of 134 Consults: MDM: Patient will be admitted by the hospitalist service Patient disposition: Admit The patient appears reasonably stabilized for admission considering the current resources, flow, and capabilities available in the ED at this time, and I doubt any other [facility] requiring further screening and/or treatment in the ED prior to admission.  Final Clinical Impression(s) / ED Diagnoses Final diagnoses:  None    Rx / DC Orders ED Discharge Orders    None       Margarita Mail, PA-C 12/21/19 0001    Dorie Rank, MD 12/22/19 2123

## 2019-12-20 NOTE — H&P (Signed)
History and Physical    [name] YHC:[id number] DOB: 1986/04/07 DOA: 12/20/2019  PCP: Patient, No Pcp Per   Patient coming from: Home   Chief Complaint: Anxious, restless, methamphetamine intoxication   HPI: [name] is a 34 y.o. male with medical history significant for untreated HIV and methamphetamine abuse, now presenting to the emergency department with anxiety and restlessness after using methamphetamine.  Patient reports that he had been able to maintain sobriety for approximately 1 week before smoking methamphetamine last night.  He reports smoking methamphetamine throughout the night last night and has since become anxious and restless, feels as though he is unable to stop moving, denies any intention of harming himself or anyone else, and denies any hallucinations.  He expresses regret for the drug relapse and wants help maintaining sobriety.  Reports that he was in his usual state prior to last night.  Reports that he has not started any medications for his HIV but is unable to express any concern or reason for his hesitancy.  ED Course: Upon arrival to the ED, patient is found to be afebrile, saturating well on room air, tachycardic to 140s, and hypertensive to 160/120.  EKG features sinus tachycardia with rate 134, RVH, and LVH.  Chemistry panel notable for AST 134, ALT 100, total protein 9.5, and creatinine 1.91, previously normal.  CBC features mild leukocytosis.  Serum CK is elevated to 2035.  Patient was given 2 L of saline and 1 mg IV Ativan in the ED.  COVID-19 screening test not yet resulted.  Review of Systems:  All other systems reviewed and apart from HPI, are negative.  Past Medical History:  Diagnosis Date  . ADHD (attention deficit hyperactivity disorder)   . Drug abuse ([place])    meth, marijuana  . HIV positive ([place])     History reviewed. No pertinent surgical history.   reports that he has been smoking cigarettes. He has a 10.00 pack-year smoking history. He has  never used smokeless tobacco. He reports current alcohol use. He reports current drug use. Drugs: Marijuana and Methamphetamines.  No Known Allergies  Family History  Problem Relation Age of Onset  . Diabetes Mother   . Hypertension Mother   . Cancer Mother   . Cancer Father   . Diabetes Father   . Hypertension Father      Prior to Admission medications   Medication Sig Start Date End Date Taking? Authorizing Provider  benzonatate (TESSALON PERLES) 100 MG capsule Take 1 capsule (100 mg total) by mouth 3 (three) times daily as needed for cough (cough). Patient not taking: Reported on 12/20/2019 07/14/19   Muthersbaugh, Jarrett Soho, PA-C  clindamycin (CLEOCIN T) 1 % lotion Apply topically 2 (two) times daily. Patient not taking: Reported on 12/20/2019 09/14/19   Muthersbaugh, Jarrett Soho, PA-C  lidocaine (LIDODERM) 5 % Place 1 patch onto the skin daily. Remove & Discard patch within 12 hours or as directed by MD Patient not taking: Reported on 12/20/2019 09/04/19   Antonietta Breach, PA-C  methocarbamol (ROBAXIN) 500 MG tablet Take 1 tablet (500 mg total) by mouth every 12 (twelve) hours as needed for muscle spasms. Patient not taking: Reported on 12/20/2019 09/04/19   Antonietta Breach, PA-C  omeprazole (PRILOSEC) 20 MG capsule Take 1 capsule (20 mg total) by mouth daily. Patient not taking: Reported on 02/14/2019 06/24/18 02/14/19  Montine Circle, PA-C    Physical Exam: Vitals:   12/20/19 1951 12/20/19 2000 12/20/19 2015 12/20/19 2030  BP: (!) 159/122 135/88 117/60 104/76  Pulse: (!) 120 (!) 118 (!) 114 (!) 120  Resp: (!) 26 (!) 34 (!) 24 (!) 26  Temp: 98.6 F (37 C)     TempSrc: Oral     SpO2: 98% 99% 98% 100%  Weight:      Height:        Constitutional: No respiratory distress, restless   Eyes: PERTLA, lids and conjunctivae normal ENMT: Mucous membranes are moist. Posterior pharynx clear of any exudate or lesions.   Neck: normal, supple, no masses, no thyromegaly Respiratory: Speaking full  sentences. No wheezing, no crackles. No accessory muscle use.  Cardiovascular: Rate ~120 and regular. No extremity edema.   Abdomen: No distension, no tenderness, soft. Bowel sounds active.  Musculoskeletal: no clubbing / cyanosis. No joint deformity upper and lower extremities.   Skin: no significant rashes, lesions, ulcers. Diaphoretic. Warm and well-perfused.  Neurologic: CN 2-12 grossly intact. Sensation intact. Strength 5/5 in all 4 limbs.  Psychiatric: Alert and oriented to person, place, and situation. Anxious, restless. Cooperative.    Labs and Imaging on Admission: I have personally reviewed following labs and imaging studies  CBC: Recent Labs  Lab 12/20/19 1743  WBC 11.5*  HGB 15.3  HCT 44.7  MCV 87.3  PLT 254   Basic Metabolic Panel: Recent Labs  Lab 12/20/19 1743  NA 141  K 3.9  CL 102  CO2 26  GLUCOSE 107*  BUN 16  CREATININE 1.91*  CALCIUM 9.3   GFR: Estimated Creatinine Clearance: 59.4 mL/min (A) (by C-G formula based on SCr of 1.91 mg/dL (H)). Liver Function Tests: Recent Labs  Lab 12/20/19 1743  AST 134*  ALT 100*  ALKPHOS 63  BILITOT 0.5  PROT 9.5*  ALBUMIN 4.6   No results for input(s): LIPASE, AMYLASE in the last 168 hours. No results for input(s): AMMONIA in the last 168 hours. Coagulation Profile: No results for input(s): INR, PROTIME in the last 168 hours. Cardiac Enzymes: Recent Labs  Lab 12/20/19 1743  CKTOTAL 2,035*   BNP (last 3 results) No results for input(s): PROBNP in the last 8760 hours. HbA1C: No results for input(s): HGBA1C in the last 72 hours. CBG: No results for input(s): GLUCAP in the last 168 hours. Lipid Profile: No results for input(s): CHOL, HDL, LDLCALC, TRIG, CHOLHDL, LDLDIRECT in the last 72 hours. Thyroid Function Tests: No results for input(s): TSH, T4TOTAL, FREET4, T3FREE, THYROIDAB in the last 72 hours. Anemia Panel: No results for input(s): VITAMINB12, FOLATE, FERRITIN, TIBC, IRON, RETICCTPCT in the  last 72 hours. Urine analysis:    Component Value Date/Time   COLORURINE DARK YELLOW 10/09/2019 1027   APPEARANCEUR CLEAR 10/09/2019 1027   LABSPEC 1.038 (H) 10/09/2019 1027   PHURINE 5.5 10/09/2019 1027   GLUCOSEU NEGATIVE 10/09/2019 1027   HGBUR NEGATIVE 10/09/2019 1027   BILIRUBINUR NEGATIVE 03/13/2019 1622   KETONESUR TRACE (A) 10/09/2019 1027   PROTEINUR 2+ (A) 10/09/2019 1027   UROBILINOGEN 2.0 (H) 12/08/2009 2247   NITRITE NEGATIVE 10/09/2019 1027   LEUKOCYTESUR NEGATIVE 10/09/2019 1027   Sepsis Labs: @LABRCNTIP (procalcitonin:4,lacticidven:4) )No results found for this or any previous visit (from the past 240 hour(s)).   Radiological Exams on Admission: No results found.  EKG: Independently reviewed. Sinus tachycardia, rate 134, RVH, LVH.   Assessment/Plan   1. Methamphetamine intoxication  - Presents with anxiety and restlessness after smoking methamphetamine, reports that he wants help quitting  - He is tachycardic to 140s in ED, hypertensive, restless, diaphoretic, but not hyperthermic    - Continue  supportive care, Ativan, IVF hydration, TOC consultation    2. Acute kidney injury; rhabdomyolysis   - SCr is 1.91 in ED (up from 0.88 in March 2021) and CK is 2035  - Related to methamphetamine abuse  - Check UA and urine chemistries, continue IVF hydration, renally-dose medications, avoid nephrotoxins, repeat chem panel in am    3. HIV  - CD4 606 and VL 248k in March 2021  - Patient not taking any medications, unable to express any particular concern or reason for this  - Consult with social work, continue counseling    4. Elevated transaminases  - AST 134 and ALT 100 in ED, were normal in March 2021  - He has hx of hep B and C and HIV  - Likely secondary to rhabdomyolysis, trend LFTs, consider imaging if fails to improve as expected with treatment of rhabdomyolysis     DVT prophylaxis: Lovenox  Code Status: Full  Family Communication: Discussed with patient    Disposition Plan:  Patient is from: Home  Anticipated d/c is to: TBD  Anticipated d/c date is: 12/21/19 Patient currently: Requiring management of acute methamphetamine toxicity  Consults called: None  Admission status: Observation     Briscoe Deutscher, MD [facility] Pager: See [email or web address]  If 7AM-7PM, please contact the daytime attending [email or web address]  12/20/2019, 9:05 PM

## 2019-12-20 NOTE — ED Notes (Signed)
Pt still jittery and nervous    His diaphoresis has lessened  He is anxious about urinating

## 2019-12-20 NOTE — ED Triage Notes (Signed)
Pt arrives POV for eval of anxiety and SOB since smoking meth PTA. Pt is twitchy and diaphoretic in triage. Ice pack given, pt very anxious. VSS.

## 2019-12-21 ENCOUNTER — Other Ambulatory Visit: Payer: Self-pay

## 2019-12-21 DIAGNOSIS — M6282 Rhabdomyolysis: Secondary | ICD-10-CM

## 2019-12-21 DIAGNOSIS — R7401 Elevation of levels of liver transaminase levels: Secondary | ICD-10-CM

## 2019-12-21 DIAGNOSIS — F15929 Other stimulant use, unspecified with intoxication, unspecified: Secondary | ICD-10-CM

## 2019-12-21 DIAGNOSIS — N179 Acute kidney failure, unspecified: Secondary | ICD-10-CM

## 2019-12-21 LAB — COMPREHENSIVE METABOLIC PANEL
ALT: 80 U/L — ABNORMAL HIGH (ref 0–44)
AST: 117 U/L — ABNORMAL HIGH (ref 15–41)
Albumin: 3.6 g/dL (ref 3.5–5.0)
Alkaline Phosphatase: 48 U/L (ref 38–126)
Anion gap: 11 (ref 5–15)
BUN: 19 mg/dL (ref 6–20)
CO2: 26 mmol/L (ref 22–32)
Calcium: 8.1 mg/dL — ABNORMAL LOW (ref 8.9–10.3)
Chloride: 97 mmol/L — ABNORMAL LOW (ref 98–111)
Creatinine, Ser: 1.82 mg/dL — ABNORMAL HIGH (ref 0.61–1.24)
GFR calc Af Amer: 55 mL/min — ABNORMAL LOW (ref >60)
GFR calc non Af Amer: 47 mL/min — ABNORMAL LOW (ref >60)
Glucose, Bld: 89 mg/dL (ref 70–99)
Potassium: 3.1 mmol/L — ABNORMAL LOW (ref 3.5–5.1)
Sodium: 134 mmol/L — ABNORMAL LOW (ref 135–145)
Total Bilirubin: 0.6 mg/dL (ref 0.3–1.2)
Total Protein: 7.5 g/dL (ref 6.5–8.1)

## 2019-12-21 LAB — CBC
HCT: 36.6 % — ABNORMAL LOW (ref 39.0–52.0)
Hemoglobin: 12.9 g/dL — ABNORMAL LOW (ref 13.0–17.0)
MCH: 30.4 pg (ref 26.0–34.0)
MCHC: 35.2 g/dL (ref 30.0–36.0)
MCV: 86.1 fL (ref 80.0–100.0)
Platelets: 219 10*3/uL (ref 150–400)
RBC: 4.25 MIL/uL (ref 4.22–5.81)
RDW: 14 % (ref 11.5–15.5)
WBC: 6.9 10*3/uL (ref 4.0–10.5)
nRBC: 0 % (ref 0.0–0.2)

## 2019-12-21 LAB — URINALYSIS, COMPLETE (UACMP) WITH MICROSCOPIC
Bilirubin Urine: NEGATIVE
Glucose, UA: NEGATIVE mg/dL
Ketones, ur: NEGATIVE mg/dL
Leukocytes,Ua: NEGATIVE
Nitrite: NEGATIVE
Protein, ur: 30 mg/dL — AB
Specific Gravity, Urine: 1.006 (ref 1.005–1.030)
pH: 5 (ref 5.0–8.0)

## 2019-12-21 LAB — CK: Total CK: 2369 U/L — ABNORMAL HIGH (ref 49–397)

## 2019-12-21 LAB — MAGNESIUM: Magnesium: 1.1 mg/dL — ABNORMAL LOW (ref 1.7–2.4)

## 2019-12-21 LAB — SODIUM, URINE, RANDOM: Sodium, Ur: <10 mmol/L

## 2019-12-21 LAB — CREATININE, URINE, RANDOM: Creatinine, Urine: 89.47 mg/dL

## 2019-12-21 LAB — PHOSPHORUS: Phosphorus: 4.5 mg/dL (ref 2.5–4.6)

## 2019-12-21 MED ORDER — SODIUM CHLORIDE 0.9 % IV SOLN: INTRAVENOUS | Status: AC

## 2019-12-21 MED ORDER — MAGNESIUM SULFATE 4 GM/100ML IV SOLN
4.0000 g | Freq: Once | INTRAVENOUS | Status: AC
  Administered 2019-12-21: 4 g via INTRAVENOUS
  Filled 2019-12-21: qty 100

## 2019-12-21 MED ORDER — LORAZEPAM 2 MG/ML IJ SOLN
1.0000 mg | Freq: Once | INTRAMUSCULAR | Status: AC
  Administered 2019-12-21: 1 mg via INTRAVENOUS
  Filled 2019-12-21: qty 1

## 2019-12-21 MED ORDER — NICOTINE 21 MG/24HR TD PT24
21.0000 mg | MEDICATED_PATCH | Freq: Every day | TRANSDERMAL | Status: DC
  Administered 2019-12-21 (×2): 21 mg via TRANSDERMAL
  Filled 2019-12-21 (×2): qty 1

## 2019-12-21 MED ORDER — POTASSIUM CHLORIDE CRYS ER 20 MEQ PO TBCR
40.0000 meq | EXTENDED_RELEASE_TABLET | Freq: Once | ORAL | Status: AC
  Administered 2019-12-21: 40 meq via ORAL
  Filled 2019-12-21: qty 2

## 2019-12-21 MED ORDER — LORAZEPAM 2 MG/ML IJ SOLN
2.0000 mg | INTRAMUSCULAR | Status: DC | PRN
  Administered 2019-12-21 (×2): 2 mg via INTRAVENOUS
  Filled 2019-12-21 (×2): qty 1

## 2019-12-21 NOTE — Progress Notes (Signed)
Pt arrived to 4E room 10. CHG completed, gown changed. Tele applied and CCMD notified. Patient oriented to room, bed and call light. Patient still presenting to be anxious and jittery. Will continue to monitor.  Willa Frater, RN

## 2019-12-21 NOTE — Progress Notes (Signed)
PROGRESS NOTE   [name]  ZOX:[id number]    DOB: 05/19/1976    DOA: 12/20/2019  PCP: Patient, No Pcp Per   I have briefly reviewed patients previous medical records in [facility].  Chief Complaint  Patient presents with  . Anxiety    Brief Narrative:  34 year old male with PMH of untreated HIV, polysubstance abuse (methamphetamine, heroin, pot and tobacco), presented to ED with anxiety and restlessness after using methamphetamine throughout the night of admission.  ED Course: Upon arrival to the ED, patient is found to be afebrile, saturating well on room air, tachycardic to 140s, and hypertensive to 160/120.  EKG features sinus tachycardia with rate 134, RVH, and LVH.  Chemistry panel notable for AST 134, ALT 100, total protein 9.5, and creatinine 1.91, previously normal.  CBC features mild leukocytosis.  Serum CK is elevated to 2035.  Patient was given 2 L of saline and 1 mg IV Ativan in the ED.  COVID-19 screening test not yet resulted.  Admitted for methamphetamine intoxication, mild rhabdomyolysis and acute kidney injury.  Assessment & Plan:  Principal Problem:   Methamphetamine intoxication ([place]) Active Problems:   HIV (human immunodeficiency virus infection) ([place])   Methamphetamine use disorder, severe, dependence ([place])   AKI (acute kidney injury) ([place])   Rhabdomyolysis   Elevated transaminase level    1. Methamphetamine intoxication/polysubstance abuse  (methamphetamine, heroin, pot, tobacco) - Presents with anxiety and restlessness after smoking methamphetamine, reports that he wants help quitting  - He was tachycardic to 140s in ED, hypertensive, restless, diaphoretic, but not hyperthermic    - Continue supportive care, Ativan, IVF hydration, TOC consultation   -Today patient reported that in addition to methamphetamine he smoked heroin and pot.  Clinically better, not restless, tachycardia has settled. -Cessation counseled.  Continue nicotine patch.  2. Acute  kidney injury; rhabdomyolysis   - SCr is 1.91 in ED (up from 0.88 in March 2021) and CK is 2035  - Related to methamphetamine abuse  - Urine microscopy: 30 proteins, hyaline casts.  CK has gone up from 2035 > 2369.  Creatinine has slightly improved from 1.91-1.82.  Continue aggressive IV fluids.  Avoid nephrotoxic's.  3. HIV  - CD4 606 and VL 248k in March 2021  - Patient not taking any medications, unable to express any particular concern or reason for this  - Consult with social work, continue counseling   -Will need to follow-up with ID as outpatient.  4. Elevated transaminases  - AST 134 and ALT 100 in ED, were normal in March 2021  - He has hx of hep B and C and HIV  - Likely secondary to rhabdomyolysis, trend LFTs, consider imaging if fails to improve as expected with treatment of rhabdomyolysis   -LFTs slightly better.  Hypokalemia/hypomagnesemia 3.1 and 1.1 respectively.  Replace aggressively and follow in a.m.  Phosphorus normal.  Body mass index is 24.35 kg/m.   DVT prophylaxis: Lovenox Code Status: Full Family Communication: None at Disposition:  Status is: Observation  The patient will require care spanning > 2 midnights and should be moved to inpatient because: IV treatments appropriate due to intensity of illness or inability to take PO  Dispo: The patient is from: Home              Anticipated d/c is to: Home              Anticipated d/c date is: 2 days  Patient currently is not medically stable to d/c.        Consultants:   None  Procedures:   None  Antimicrobials:   None   Subjective:  Patient interviewed and examined along with his RN in room.  As per RN, settled overnight after Ativan and slept well.  Patient arousable.  Indicates that he is thirsty and wants something to drink.  Denies any specific complaints or pain.  Objective:   Vitals:   12/21/19 0422 12/21/19 0456 12/21/19 0800 12/21/19 1144  BP: (!) 121/95  127/78  119/70  Pulse: 93  95 93  Resp: 17  17 20   Temp: 97.8 F (36.6 C)  97.9 F (36.6 C) 97.6 F (36.4 C)  TempSrc: Axillary  Axillary Oral  SpO2: 99%  99% 100%  Weight:  83.7 kg    Height:        General exam: Pleasant young male, moderately built and nourished lying comfortably propped up in bed without distress.  Oral mucosa dry. Respiratory system: Clear to auscultation. Respiratory effort normal. Cardiovascular system: S1 & S2 heard, RRR. No JVD, murmurs, rubs, gallops or clicks. No pedal edema.  Telemetry personally reviewed: Sinus rhythm in the 90s. Gastrointestinal system: Abdomen is nondistended, soft and nontender. No organomegaly or masses felt. Normal bowel sounds heard. Central nervous system: Sleeping but easily arousable, alert and oriented. No focal neurological deficits. Extremities: Symmetric 5 x 5 power. Skin: No rashes, lesions or ulcers Psychiatry: Judgement and insight appear normal. Mood & affect appropriate.     Data Reviewed:   I have personally reviewed following labs and imaging studies   CBC: Recent Labs  Lab 12/20/19 1743 12/21/19 0040  WBC 11.5* 6.9  HGB 15.3 12.9*  HCT 44.7 36.6*  MCV 87.3 86.1  PLT 254 219    Basic Metabolic Panel: Recent Labs  Lab 12/20/19 1743 12/21/19 0040 12/21/19 0047  NA 141 134*  --   K 3.9 3.1*  --   CL 102 97*  --   CO2 26 26  --   GLUCOSE 107* 89  --   BUN 16 19  --   CREATININE 1.91* 1.82*  --   CALCIUM 9.3 8.1*  --   MG  --  1.1*  --   PHOS  --   --  4.5    Liver Function Tests: Recent Labs  Lab 12/20/19 1743 12/21/19 0040  AST 134* 117*  ALT 100* 80*  ALKPHOS 63 48  BILITOT 0.5 0.6  PROT 9.5* 7.5  ALBUMIN 4.6 3.6    CBG: No results for input(s): GLUCAP in the last 168 hours.  Microbiology Studies:   Recent Results (from the past 240 hour(s))  SARS Coronavirus 2 by RT PCR (hospital order, performed in [facility] hospital lab) Nasopharyngeal Nasopharyngeal Swab     Status: None    Collection Time: 12/20/19  9:30 PM   Specimen: Nasopharyngeal Swab  Result Value Ref Range Status   SARS Coronavirus 2 NEGATIVE NEGATIVE Final    Comment: (NOTE) SARS-CoV-2 target nucleic acids are NOT DETECTED. The SARS-CoV-2 RNA is generally detectable in upper and lower respiratory specimens during the acute phase of infection. The lowest concentration of SARS-CoV-2 viral copies this assay can detect is 250 copies / mL. A negative result does not preclude SARS-CoV-2 infection and should not be used as the sole basis for treatment or other patient management decisions.  A negative result may occur with improper specimen collection / handling, submission of specimen  other than nasopharyngeal swab, presence of viral mutation(s) within the areas targeted by this assay, and inadequate number of viral copies (<250 copies / mL). A negative result must be combined with clinical observations, patient history, and epidemiological information. Fact Sheet for Patients:   BoilerBrush.com.cy Fact Sheet for Healthcare Providers: https://pope.com/ This test is not yet approved or cleared  by the [place] FDA and has been authorized for detection and/or diagnosis of SARS-CoV-2 by FDA under an Emergency Use Authorization (EUA).  This EUA will remain in effect (meaning this test can be used) for the duration of the COVID-19 declaration under Section 564(b)(1) of the Act, 21 U.S.C. section 360bbb-3(b)(1), unless the authorization is terminated or revoked sooner. Performed at [facility], [address], [place], [place] [address]      Radiology Studies:  No results found.   Scheduled Meds:   . enoxaparin (LOVENOX) injection  40 mg Subcutaneous Q24H  . nicotine  21 mg Transdermal Daily  . sodium chloride flush  3 mL Intravenous Q12H    Continuous Infusions:   . sodium chloride 125 mL/hr at 12/21/19 1600     LOS: 0 days      Marcellus Scott, MD, [place], [facility]. [facility]    To contact the attending provider between 7A-7P or the covering provider during after hours 7P-7A, please log into the web site [email or web address] and access using universal [place] password for that web site. If you do not have the password, please call the hospital operator.  12/21/2019, 4:52 PM

## 2019-12-22 NOTE — Progress Notes (Signed)
Pt left AMA. Risks were explained to pt and pt stated that he understood. IVs and telemetry box were removed. Pt left with all of his belongings.

## 2019-12-22 NOTE — Discharge Summary (Addendum)
Physician Discharge Summary  Jcion Buddenhagen RSW:[id number] DOB: 05/19/1988  PCP: Patient, No Pcp Per   Patient left the hospital AGAINST MEDICAL ADVICE.  Admitted from: Home Discharged to: Home  Admit date: 12/20/2019 Discharge date: 12/22/2019  Recommendations for Outpatient Follow-up:  Patient left AMA before he was medically ready for discharge.     Home Health: N/A Equipment/Devices: N/A    Discharge Condition: Guarded. CODE STATUS: Full Diet recommendation: N/A  Discharge Diagnoses:  Principal Problem:   Methamphetamine intoxication (HCC) Active Problems:   HIV (human immunodeficiency virus infection) (HCC)   Methamphetamine use disorder, severe, dependence (HCC)   AKI (acute kidney injury) (HCC)   Rhabdomyolysis   Elevated transaminase level   Brief Summary: 34 year old male with PMH of untreated HIV, polysubstance abuse (methamphetamine, heroin, pot and tobacco), presented to ED with anxiety and restlessness after using methamphetamine throughout the night of admission.  ED Course:Upon arrival to the ED, patient is found to be afebrile, saturating well on room air, tachycardic to 140s, and hypertensive to 160/120. EKG features sinus tachycardia with rate 134, RVH, and LVH. Chemistry panel notable for AST 134, ALT 100, total protein 9.5, and creatinine 1.91, previously normal. CBC features mild leukocytosis. Serum CK is elevated to 2035. Patient was given 2 L of saline and 1 mg IV Ativan in the ED. COVID-19 screening test not yet resulted.  Admitted for methamphetamine intoxication, mild rhabdomyolysis and acute kidney injury.  Assessment & Plan:   1.Methamphetamine intoxication/polysubstance abuse (methamphetamine, heroin, pot, tobacco) -Presents with anxiety and restlessness after smoking methamphetamine, reports that he wants help quitting -He was tachycardic to 140s in ED, hypertensive, restless, diaphoretic, but not hyperthermic -Continued supportive  care, Ativan, IVF hydration, TOC consultation -Yesterday patient reported that in addition to methamphetamine he smoked heroin and pot.  Clinically better, not restless, tachycardia has settled. -Cessation counseled.  Continue nicotine patch. -I evaluated patient this morning and he appeared stable.  2.Acute kidney injury; rhabdomyolysis -SCr is 1.91 in ED (up from 0.88 in March 2021) and CK is 2035  - Related to methamphetamine abuse -Urine microscopy: 30 proteins, hyaline casts.  CK has gone up from 2035 > 2369.  Creatinine has slightly improved from 1.91-1.82.  Continue aggressive IV fluids.  Avoid nephrotoxic's. -When I evaluated him in the morning, no lab work was available for today despite having been requested.  I discussed with RN at bedside to get lab work drawn and page me with results for necessary recommendations.  However patient left AMA even before labs were drawn and addressed appropriately.   3.HIV -CD4 606 and VL 248k in March 2021 -Patient not taking any medications, unable to express any particular concern or reason for this -Consult with social work, continue counseling -Will need to follow-up with ID as outpatient.  This was discussed with him.  He verbalized understanding.  He stated that he was diagnosed with HIV about 6 times ago, had been referred to ID multiple times but never went.  4.Elevated transaminases -AST 134 and ALT 100 in ED, were normal in March 2021 -He has hx of hep B and C and HIV -Likely secondary to rhabdomyolysis, trend LFTs, consider imaging if fails to improve as expected with treatment of rhabdomyolysis -LFTs slightly better. -Again no labs available for today.  Hypokalemia/hypomagnesemia 3.1 and 1.1 respectively.  Replaced aggressively.  Phosphorus normal. Patient did not wait for repeat labs and evaluation  Body mass index is 24.35 kg/m.  I had interviewed and examined patient earlier in the  morning.  He  wanted his bed alarms taken off but otherwise had no complaints.  Shortly thereafter, I was paged by patient's RN while I was taking care of another patient and that patient's room indicating that patient wanted to leave AMA.  Since I could not leave the patient right away, I advised RN to discuss in detail with the patient and warn him that if he leaves the hospital AMA prior to completing his full medical treatment, there is high risk of worsening clinical condition including worsening acute renal failure, liver abnormalities, electrolyte disturbances and even death.  Despite this, he signed out AMA.  He has capacity to make medical decisions but believes that he is making poor judgment.   Discharge Instructions None provided because patient left AMA prior to formal discharge.  No Known Allergies    Procedures/Studies: No results found.    Subjective: As noted above.  Discharge Exam:  Vitals:   12/21/19 2106 12/22/19 0026 12/22/19 0421 12/22/19 0810  BP: 121/78 113/65 (!) 105/55 116/69  Pulse: 79   66  Resp: 16 16 (!) 8 17  Temp: 97.9 F (36.6 C) 97.8 F (36.6 C) 97.8 F (36.6 C) 97.7 F (36.5 C)  TempSrc: Oral Oral Oral Oral  SpO2: 100% 98% 97% 100%  Weight:      Height:        General exam: Pleasant young male, moderately built and nourished sitting up comfortably in bed without distress.  Oral mucosa moist. Respiratory system: Clear to auscultation. Respiratory effort normal. Cardiovascular system: S1 & S2 heard, RRR. No JVD, murmurs, rubs, gallops or clicks. No pedal edema.   Gastrointestinal system: Abdomen is nondistended, soft and nontender. No organomegaly or masses felt. Normal bowel sounds heard. Central nervous system:  Alert and oriented. No focal neurological deficits. Extremities: Symmetric 5 x 5 power. Skin: No rashes, lesions or ulcers Psychiatry: Judgement and insight appear impaired given that he signed out AMA despite advice. Mood & affect appeared  slightly irritable.    The results of significant diagnostics from this hospitalization (including imaging, microbiology, ancillary and laboratory) are listed below for reference.     Microbiology: Recent Results (from the past 240 hour(s))  SARS Coronavirus 2 by RT PCR (hospital order, performed in [facility] hospital lab) Nasopharyngeal Nasopharyngeal Swab     Status: None   Collection Time: 12/20/19  9:30 PM   Specimen: Nasopharyngeal Swab  Result Value Ref Range Status   SARS Coronavirus 2 NEGATIVE NEGATIVE Final    Comment: (NOTE) SARS-CoV-2 target nucleic acids are NOT DETECTED. The SARS-CoV-2 RNA is generally detectable in upper and lower respiratory specimens during the acute phase of infection. The lowest concentration of SARS-CoV-2 viral copies this assay can detect is 250 copies / mL. A negative result does not preclude SARS-CoV-2 infection and should not be used as the sole basis for treatment or other patient management decisions.  A negative result may occur with improper specimen collection / handling, submission of specimen other than nasopharyngeal swab, presence of viral mutation(s) within the areas targeted by this assay, and inadequate number of viral copies (<250 copies / mL). A negative result must be combined with clinical observations, patient history, and epidemiological information. Fact Sheet for Patients:   StrictlyIdeas.no Fact Sheet for Healthcare Providers: BankingDealers.co.za This test is not yet approved or cleared  by the [place] FDA and has been authorized for detection and/or diagnosis of SARS-CoV-2 by FDA under an Emergency Use Authorization (EUA).  This EUA  will remain in effect (meaning this test can be used) for the duration of the COVID-19 declaration under Section 564(b)(1) of the Act, 21 U.S.C. section 360bbb-3(b)(1), unless the authorization is terminated or revoked sooner. Performed  at [facility], [address], [place], [place] [address]      Labs: CBC: Recent Labs  Lab 12/20/19 1743 12/21/19 0040  WBC 11.5* 6.9  HGB 15.3 12.9*  HCT 44.7 36.6*  MCV 87.3 86.1  PLT 254 219    Basic Metabolic Panel: Recent Labs  Lab 12/20/19 1743 12/21/19 0040 12/21/19 0047  NA 141 134*  --   K 3.9 3.1*  --   CL 102 97*  --   CO2 26 26  --   GLUCOSE 107* 89  --   BUN 16 19  --   CREATININE 1.91* 1.82*  --   CALCIUM 9.3 8.1*  --   MG  --  1.1*  --   PHOS  --   --  4.5    Liver Function Tests: Recent Labs  Lab 12/20/19 1743 12/21/19 0040  AST 134* 117*  ALT 100* 80*  ALKPHOS 63 48  BILITOT 0.5 0.6  PROT 9.5* 7.5  ALBUMIN 4.6 3.6     Urinalysis    Component Value Date/Time   COLORURINE YELLOW 12/20/2019 2341   APPEARANCEUR HAZY (A) 12/20/2019 2341   LABSPEC 1.006 12/20/2019 2341   PHURINE 5.0 12/20/2019 2341   GLUCOSEU NEGATIVE 12/20/2019 2341   HGBUR SMALL (A) 12/20/2019 2341   BILIRUBINUR NEGATIVE 12/20/2019 2341   KETONESUR NEGATIVE 12/20/2019 2341   PROTEINUR 30 (A) 12/20/2019 2341   UROBILINOGEN 2.0 (H) 12/08/2009 2247   NITRITE NEGATIVE 12/20/2019 2341   LEUKOCYTESUR NEGATIVE 12/20/2019 2341      Time coordinating discharge: 20 minutes  SIGNED:  Marcellus Scott, MD, FACP, [facility]. [facility]  To contact the attending provider between 7A-7P or the covering provider during after hours 7P-7A, please log into the web site [email or web address] and access using universal [place] password for that web site. If you do not have the password, please call the hospital operator.

## 2019-12-27 ENCOUNTER — Encounter (HOSPITAL_COMMUNITY): Payer: Self-pay | Admitting: Emergency Medicine

## 2019-12-27 ENCOUNTER — Other Ambulatory Visit: Payer: Self-pay

## 2019-12-27 ENCOUNTER — Emergency Department (HOSPITAL_COMMUNITY)
Admission: EM | Admit: 2019-12-27 | Discharge: 2019-12-28 | Disposition: A | Discharge status: Home / Self Care | Payer: Self-pay | Attending: Emergency Medicine | Admitting: Emergency Medicine

## 2019-12-27 ENCOUNTER — Emergency Department (HOSPITAL_COMMUNITY)
Admission: EM | Admit: 2019-12-27 | Discharge: 2019-12-27 | Disposition: A | Discharge status: Home / Self Care | Payer: Self-pay | Attending: Emergency Medicine | Admitting: Emergency Medicine

## 2019-12-27 DIAGNOSIS — Z590 Homelessness: Secondary | ICD-10-CM | POA: Insufficient documentation

## 2019-12-27 DIAGNOSIS — M25572 Pain in left ankle and joints of left foot: Secondary | ICD-10-CM | POA: Insufficient documentation

## 2019-12-27 DIAGNOSIS — F17210 Nicotine dependence, cigarettes, uncomplicated: Secondary | ICD-10-CM | POA: Insufficient documentation

## 2019-12-27 DIAGNOSIS — M79672 Pain in left foot: Secondary | ICD-10-CM | POA: Insufficient documentation

## 2019-12-27 DIAGNOSIS — F1210 Cannabis abuse, uncomplicated: Secondary | ICD-10-CM | POA: Insufficient documentation

## 2019-12-27 DIAGNOSIS — B20 Human immunodeficiency virus [HIV] disease: Secondary | ICD-10-CM | POA: Insufficient documentation

## 2019-12-27 DIAGNOSIS — M79671 Pain in right foot: Secondary | ICD-10-CM | POA: Insufficient documentation

## 2019-12-27 DIAGNOSIS — M546 Pain in thoracic spine: Secondary | ICD-10-CM | POA: Insufficient documentation

## 2019-12-27 MED ORDER — IBUPROFEN 400 MG PO TABS
600.0000 mg | ORAL_TABLET | Freq: Once | ORAL | Status: AC
  Administered 2019-12-27: 600 mg via ORAL
  Filled 2019-12-27: qty 1

## 2019-12-27 NOTE — ED Triage Notes (Signed)
Pt is homeless and has been walking a lot and carrying heavy book bag and says that his feet have been hurting and swelling. No visual swelling noted.

## 2019-12-27 NOTE — ED Triage Notes (Signed)
Patient reports bilateral ankle pain onset today , patient stated he has been walking a lot (homeless) .

## 2019-12-27 NOTE — ED Provider Notes (Signed)
[name] EMERGENCY DEPARTMENT Provider Note   CSN: [id number] Arrival date & time: 12/27/19  0001     History Chief Complaint  Patient presents with  . Ankle Pain    [name] is a 34 y.o. male with PMHx ADHD and uncontrolled HIV who presents to the ED today initially with complaint of bilateral ankle/foot pain. Per triage report pt is homeless and has been walking a lot and carrying a heavy book bag. When I enter the room after pt was in the waiting room for 7 hours he reports his upper back is bothering him and not so much his ankles. Again reports he has been walking a lot and carrying his heavy book bag. He has not been taking anything for his pain. He is a daily meth user. Denies fevers, chills, weakness, numbness, paresthesias, lower back pain, urinary retention, urinary or bowel incontinence, saddle anesthesia, or any other associated symptoms.   The history is provided by the patient and medical records.       Past Medical History:  Diagnosis Date  . ADHD (attention deficit hyperactivity disorder)   . Drug abuse (HCC)    meth, marijuana  . HIV positive [facility])     Patient Active Problem List   Diagnosis Date Noted  . Methamphetamine intoxication (HCC) 12/20/2019  . AKI (acute kidney injury) (HCC) 12/20/2019  . Rhabdomyolysis 12/20/2019  . Elevated transaminase level 12/20/2019  . Elevated CK   . Methamphetamine use disorder, severe, dependence (HCC) 07/10/2019  . 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 HIV (human immunodeficiency virus infection) (HCC) 06/05/2019    No past surgical history on file.     Family History  Problem Relation Age of Onset  . Diabetes Mother   . Hypertension Mother   . Cancer Mother   . Cancer Father   . Diabetes Father   . Hypertension Father     Social History   Tobacco Use  . Smoking status:  Current Every Day Smoker    Packs/day: 1.00    Years: 10.00    Pack years: 10.00    Types: Cigarettes  . Smokeless tobacco: Never Used  Vaping Use  . Vaping Use: Never used  Substance Use Topics  . Alcohol use: Yes  . Drug use: Yes    Types: Marijuana, Methamphetamines    Comment: last used 4 days ago    Home Medications Prior to Admission medications   Medication Sig Start Date End Date Taking? Authorizing Provider  benzonatate (TESSALON PERLES) 100 MG capsule Take 1 capsule (100 mg total) by mouth 3 (three) times daily as needed for cough (cough). Patient not taking: Reported on 12/20/2019 07/14/19   Muthersbaugh, Dahlia Client, PA-C  clindamycin (CLEOCIN T) 1 % lotion Apply topically 2 (two) times daily. Patient not taking: Reported on 12/20/2019 09/14/19   Muthersbaugh, Dahlia Client, PA-C  lidocaine (LIDODERM) 5 % Place 1 patch onto the skin daily. Remove & Discard patch within 12 hours or as directed by MD Patient not taking: Reported on 12/20/2019 09/04/19   Antony Madura, PA-C  methocarbamol (ROBAXIN) 500 MG tablet Take 1 tablet (500 mg total) by mouth every 12 (twelve) hours as needed for muscle spasms. Patient not taking: Reported on 12/20/2019 09/04/19   Antony Madura, PA-C  omeprazole (PRILOSEC) 20 MG capsule Take 1 capsule (20 mg total) by mouth daily. Patient not taking: Reported on 02/14/2019 06/24/18 02/14/19  Roxy Horseman, PA-C  Allergies    Patient has no known allergies.  Review of Systems   Review of Systems  Constitutional: Negative for chills and fever.  Musculoskeletal: Positive for neck pain.  Neurological: Negative for weakness and numbness.    Physical Exam Updated Vital Signs BP 118/79 (BP Location: Right Arm)   Pulse 71   Temp 97.7 F (36.5 C) (Oral)   Resp 18   SpO2 100%   Physical Exam Vitals and nursing note reviewed.  Constitutional:      Appearance: He is not ill-appearing or diaphoretic.  HENT:     Head: Normocephalic and atraumatic.  Eyes:      Conjunctiva/sclera: Conjunctivae normal.  Neck:     Comments: No C midline spinal TTP Cardiovascular:     Rate and Rhythm: Normal rate and regular rhythm.     Pulses: Normal pulses.  Pulmonary:     Effort: Pulmonary effort is normal.     Breath sounds: Normal breath sounds. No wheezing, rhonchi or rales.  Musculoskeletal:     Comments: No T or L midline spinal TTP. + Bilateral upper thoracic paraspinal musculature TTP. Strength 5/5 to BUEs. Sensation intact throughout. 2+ radial pulses bilaterally.   Skin:    General: Skin is warm and dry.     Coloration: Skin is not jaundiced.  Neurological:     Mental Status: He is alert.     ED Results / Procedures / Treatments   Labs (all labs ordered are listed, but only abnormal results are displayed) Labs Reviewed - No data to display  EKG None  Radiology No results found.  Procedures Procedures (including critical care time)  Medications Ordered in ED Medications  ibuprofen (ADVIL) tablet 600 mg (600 mg Oral Given 12/27/19 7829)    ED Course  I have reviewed the triage vital signs and the nursing notes.  Pertinent labs & imaging results that were available during my care of the patient were reviewed by me and considered in my medical decision making (see chart for details).    MDM Rules/Calculators/A&P                          34 year old male who is homeless who presents to the ED initially complaining of bilateral ankle pain however as I enter the room he states he is having upper back pain.  He states he walks a lot carries a heavy backpack.  No fall.  He is a meth user however no lower back pain or red flag symptoms today.  Has no midline spinal tenderness.  He has bilateral upper thoracic paraspinal musculature tenderness suspected from carrying heavy backpack.  Will provide ibuprofen and discharge at this time.   This note was prepared using Dragon voice recognition software and may include unintentional dictation errors  due to the inherent limitations of voice recognition software.  Final Clinical Impression(s) / ED Diagnoses Final diagnoses:  Acute bilateral thoracic back pain    Rx / DC Orders ED Discharge Orders    None       Discharge Instructions     Follow up with [facility] for your primary care needs Return to the ED for any worsening symptoms including weakness, numbness, tingling, fevers > 100.4, chills, or any other new/concerning symptoms.        Eustaquio Maize, PA-C 12/27/19 [place], [place], DO 12/27/19 1215

## 2019-12-27 NOTE — Discharge Instructions (Addendum)
Follow up with [facility] for your primary care needs Return to the ED for any worsening symptoms including weakness, numbness, tingling, fevers > 100.4, chills, or any other new/concerning symptoms.

## 2019-12-27 NOTE — ED Notes (Signed)
Pt left from room before this RN brought in AVS or could get discharge vitals.

## 2019-12-28 MED ORDER — IBUPROFEN 400 MG PO TABS
600.0000 mg | ORAL_TABLET | Freq: Once | ORAL | Status: DC
  Filled 2019-12-28: qty 1

## 2019-12-28 NOTE — Discharge Instructions (Addendum)
You were seen in the emergency department today for foot pain.  You were given a dose of ibuprofen.  Please continue to take ibuprofen and/or Tylenol per over-the-counter dosing to help with discomfort.  Please try to rest.  Follow-up with the [facility] within 3 days.  Return to the ER for new or worsening symptoms or any other concerns.

## 2019-12-28 NOTE — ED Provider Notes (Signed)
[name] EMERGENCY DEPARTMENT Provider Note   CSN: [id number] Arrival date & time: 12/27/19  2314     History Chief Complaint  Patient presents with  . Ankle Pain    Elray Dains is a 34 y.o. male with a history of HIV, ADHD, homelessness, and substance abuse who returns to the emergency department with complaints of bilateral foot/ankle pain for several days. Pain is constant, worse with walking, no alleviating factors. States he does a lot of walking due to being homeless. No traumatic injury. Denies wounds, fever, chills, numbness, or weakness.    HPI     Past Medical History:  Diagnosis Date  . ADHD (attention deficit hyperactivity disorder)   . Drug abuse (HCC)    meth, marijuana  . HIV positive [facility])     Patient Active Problem List   Diagnosis Date Noted  . Methamphetamine intoxication (HCC) 12/20/2019  . AKI (acute kidney injury) (HCC) 12/20/2019  . Rhabdomyolysis 12/20/2019  . Elevated transaminase level 12/20/2019  . Elevated CK   . Methamphetamine use disorder, severe, dependence (HCC) 07/10/2019  . 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 HIV (human immunodeficiency virus infection) (HCC) 06/05/2019    History reviewed. No pertinent surgical history.     Family History  Problem Relation Age of Onset  . Diabetes Mother   . Hypertension Mother   . Cancer Mother   . Cancer Father   . Diabetes Father   . Hypertension Father     Social History   Tobacco Use  . Smoking status: Current Every Day Smoker    Packs/day: 1.00    Years: 10.00    Pack years: 10.00    Types: Cigarettes  . Smokeless tobacco: Never Used  Vaping Use  . Vaping Use: Never used  Substance Use Topics  . Alcohol use: Yes  . Drug use: Yes    Types: Marijuana, Methamphetamines    Comment: last used 4 days ago    Home Medications Prior to Admission  medications   Medication Sig Start Date End Date Taking? Authorizing Provider  benzonatate (TESSALON PERLES) 100 MG capsule Take 1 capsule (100 mg total) by mouth 3 (three) times daily as needed for cough (cough). Patient not taking: Reported on 12/20/2019 07/14/19   Muthersbaugh, Dahlia Client, PA-C  clindamycin (CLEOCIN T) 1 % lotion Apply topically 2 (two) times daily. Patient not taking: Reported on 12/20/2019 09/14/19   Muthersbaugh, Dahlia Client, PA-C  lidocaine (LIDODERM) 5 % Place 1 patch onto the skin daily. Remove & Discard patch within 12 hours or as directed by MD Patient not taking: Reported on 12/20/2019 09/04/19   Antony Madura, PA-C  methocarbamol (ROBAXIN) 500 MG tablet Take 1 tablet (500 mg total) by mouth every 12 (twelve) hours as needed for muscle spasms. Patient not taking: Reported on 12/20/2019 09/04/19   Antony Madura, PA-C  omeprazole (PRILOSEC) 20 MG capsule Take 1 capsule (20 mg total) by mouth daily. Patient not taking: Reported on 02/14/2019 06/24/18 02/14/19  Roxy Horseman, PA-C    Allergies    Patient has no known allergies.  Review of Systems   Review of Systems  Constitutional: Negative for chills and fever.  Respiratory: Negative for shortness of breath.   Cardiovascular: Negative for chest pain.  Musculoskeletal: Positive for arthralgias and myalgias.  Skin: Negative for wound.  Neurological: Negative for weakness and numbness.    Physical Exam Updated Vital Signs BP 120/70  Pulse 76   Temp 97.9 F (36.6 C)   Resp 15   Ht 6\' 1"  (1.854 m)   Wt 85 kg   SpO2 98%   BMI 24.72 kg/m   Physical Exam Vitals and nursing note reviewed.  Constitutional:      General: He is not in acute distress.    Appearance: He is not ill-appearing or toxic-appearing.  HENT:     Head: Normocephalic and atraumatic.  Cardiovascular:     Rate and Rhythm: Normal rate.     Pulses:          Dorsalis pedis pulses are 2+ on the right side and 2+ on the left side.       Posterior tibial  pulses are 2+ on the right side and 2+ on the left side.  Pulmonary:     Effort: Pulmonary effort is normal.  Musculoskeletal:     Comments: Lower extremities: No obvious deformity, appreciable swelling, edema, erythema, ecchymosis, warmth, or open wounds. A few small callouses noted. Patient has intact AROM to bilateral hips, knees, ankles, and all digits. No focal tenderness to palpation. Compartments are soft.   Skin:    General: Skin is warm and dry.     Capillary Refill: Capillary refill takes less than 2 seconds.  Neurological:     Mental Status: He is alert.     Comments: Alert. Clear speech. Sensation grossly intact to bilateral lower extremities. 5/5 strength with plantar/dorsiflexion bilaterally. Patient ambulatory.   Psychiatric:        Mood and Affect: Mood normal.        Behavior: Behavior normal.     ED Results / Procedures / Treatments   Labs (all labs ordered are listed, but only abnormal results are displayed) Labs Reviewed - No data to display  EKG None  Radiology No results found.  Procedures Procedures (including critical care time)  Medications Ordered in ED Medications  ibuprofen (ADVIL) tablet 600 mg (has no administration in time range)    ED Course  I have reviewed the triage vital signs and the nursing notes.  Pertinent labs & imaging results that were available during my care of the patient were reviewed by me and considered in my medical decision making (see chart for details).    MDM Rules/Calculators/A&P                          Patient returns to the emergency department with complaints of foot and ankle discomfort in the setting of being homeless with extended walking.  He has no traumatic injury reported, no focal bony tenderness, low suspicion for fracture dislocation.  There are no significant open wounds, no erythema, no warmth, afebrile, no signs of infection. No edema, no calf tenderness, doubt DVT.  Seen in the ED for same recently.  He was able to rest in the ER, given ibuprofen here, will discharge at this time with resources. I discussed treatment plan, need for follow-up, and return precautions with the patient. Provided opportunity for questions, patient confirmed understanding and is in agreement with plan.   Final Clinical Impression(s) / ED Diagnoses Final diagnoses:  Bilateral foot pain    Rx / DC Orders ED Discharge Orders    None       Amaryllis Dyke, PA-C 12/28/19 0428    Ward, Delice Bison, DO 12/28/19 [phone]

## 2019-12-30 ENCOUNTER — Ambulatory Visit: Payer: Self-pay | Admitting: Internal Medicine

## 2019-12-30 ENCOUNTER — Telehealth: Payer: Self-pay | Admitting: Pharmacy Technician

## 2019-12-30 NOTE — Telephone Encounter (Signed)
[facility] Patient Product/process development scientist completed.    The patient is insured through [employer] and has a $3000 copay.  I tried calling his insurance to see what the status is with no answer.  To lower the cost, most likely the medication must be filled through their pharmacy.  We will continue to follow to see if copay assistance is needed.  Netty Starring. Dimas Aguas CPhT [facility] Pharmacy Patient [facility] Phone: [phone] Fax:  [phone]

## 2020-01-05 ENCOUNTER — Emergency Department (HOSPITAL_COMMUNITY)
Admission: EM | Admit: 2020-01-05 | Discharge: 2020-01-05 | Discharge status: Against Medical Advice | Payer: Self-pay | Attending: Emergency Medicine | Admitting: Emergency Medicine

## 2020-01-05 ENCOUNTER — Encounter (HOSPITAL_COMMUNITY): Payer: Self-pay

## 2020-01-05 ENCOUNTER — Other Ambulatory Visit: Payer: Self-pay

## 2020-01-05 DIAGNOSIS — F1510 Other stimulant abuse, uncomplicated: Secondary | ICD-10-CM | POA: Insufficient documentation

## 2020-01-05 DIAGNOSIS — F17210 Nicotine dependence, cigarettes, uncomplicated: Secondary | ICD-10-CM | POA: Insufficient documentation

## 2020-01-05 DIAGNOSIS — F1910 Other psychoactive substance abuse, uncomplicated: Secondary | ICD-10-CM | POA: Insufficient documentation

## 2020-01-05 DIAGNOSIS — B20 Human immunodeficiency virus [HIV] disease: Secondary | ICD-10-CM | POA: Insufficient documentation

## 2020-01-05 DIAGNOSIS — R531 Weakness: Secondary | ICD-10-CM | POA: Insufficient documentation

## 2020-01-05 DIAGNOSIS — E876 Hypokalemia: Secondary | ICD-10-CM | POA: Insufficient documentation

## 2020-01-05 DIAGNOSIS — F1210 Cannabis abuse, uncomplicated: Secondary | ICD-10-CM | POA: Insufficient documentation

## 2020-01-05 DIAGNOSIS — Z5320 Procedure and treatment not carried out because of patient's decision for unspecified reasons: Secondary | ICD-10-CM | POA: Insufficient documentation

## 2020-01-05 LAB — COMPREHENSIVE METABOLIC PANEL
ALT: 48 U/L — ABNORMAL HIGH (ref 0–44)
AST: 60 U/L — ABNORMAL HIGH (ref 15–41)
Albumin: 4.6 g/dL (ref 3.5–5.0)
Alkaline Phosphatase: 61 U/L (ref 38–126)
Anion gap: 13 (ref 5–15)
BUN: 19 mg/dL (ref 6–20)
CO2: 26 mmol/L (ref 22–32)
Calcium: 9.7 mg/dL (ref 8.9–10.3)
Chloride: 99 mmol/L (ref 98–111)
Creatinine, Ser: 1.49 mg/dL — ABNORMAL HIGH (ref 0.61–1.24)
GFR calc Af Amer: >60 mL/min (ref >60)
GFR calc non Af Amer: >60 mL/min (ref >60)
Glucose, Bld: 110 mg/dL — ABNORMAL HIGH (ref 70–99)
Potassium: 3.2 mmol/L — ABNORMAL LOW (ref 3.5–5.1)
Sodium: 138 mmol/L (ref 135–145)
Total Bilirubin: 1 mg/dL (ref 0.3–1.2)
Total Protein: 9.2 g/dL — ABNORMAL HIGH (ref 6.5–8.1)

## 2020-01-05 LAB — CBC
HCT: 41.9 % (ref 39.0–52.0)
Hemoglobin: 14.6 g/dL (ref 13.0–17.0)
MCH: 29.9 pg (ref 26.0–34.0)
MCHC: 34.8 g/dL (ref 30.0–36.0)
MCV: 85.9 fL (ref 80.0–100.0)
Platelets: 299 10*3/uL (ref 150–400)
RBC: 4.88 MIL/uL (ref 4.22–5.81)
RDW: 13.2 % (ref 11.5–15.5)
WBC: 5.9 10*3/uL (ref 4.0–10.5)
nRBC: 0 % (ref 0.0–0.2)

## 2020-01-05 LAB — ETHANOL: Alcohol, Ethyl (B): <10 mg/dL (ref <10)

## 2020-01-05 MED ORDER — LORAZEPAM 1 MG PO TABS
1.0000 mg | ORAL_TABLET | Freq: Once | ORAL | Status: AC
  Administered 2020-01-05: 1 mg via ORAL
  Filled 2020-01-05: qty 1

## 2020-01-05 MED ORDER — POTASSIUM CHLORIDE CRYS ER 20 MEQ PO TBCR
20.0000 meq | EXTENDED_RELEASE_TABLET | Freq: Once | ORAL | Status: AC
  Administered 2020-01-05: 20 meq via ORAL
  Filled 2020-01-05: qty 1

## 2020-01-05 NOTE — ED Notes (Signed)
Rounded on pt. Pt no longer in room. Dr. Charm Barges informed.

## 2020-01-05 NOTE — ED Notes (Signed)
Pt ambulatory to room.

## 2020-01-05 NOTE — ED Triage Notes (Signed)
Pt presents with ongoing weakness for over a month that gets worse everyday. Pt states he missed his appointment across the street for his HIV medications. Pt extremely restless in triage. States he used "meth and crystal" this am, unsure how much he used or what time it was when he used it. HR 120 in triage

## 2020-01-05 NOTE — ED Provider Notes (Signed)
[facility] EMERGENCY DEPARTMENT Provider Note   CSN: [id number] Arrival date & time: 01/05/20  1803     History Chief Complaint  Patient presents with  . Weakness    [name] is a 34 y.o. male.  He has a history of drug abuse and is HepC and HIV positive.  Not currently on treatment.  Homeless.  He said he is feeling generally weak over the course of the last month.  Has not gone to the clinic to start on medications.  Continues to actively use meth and crystal.  Asking to be started on medication.  The history is provided by the patient.  Weakness Severity:  Moderate Onset quality:  Gradual Timing:  Constant Progression:  Worsening Chronicity:  New Context: drug use and stress   Relieved by:  Nothing Worsened by:  Activity Ineffective treatments:  None tried Associated symptoms: no abdominal pain, no chest pain, no dysuria, no fever, no foul-smelling urine, no headaches, no loss of consciousness, no shortness of breath, no stroke symptoms and no vision change        Past Medical History:  Diagnosis Date  . ADHD (attention deficit hyperactivity disorder)   . Drug abuse ([place])    meth, marijuana  . HIV positive [facility])     Patient Active Problem List   Diagnosis Date Noted  . Methamphetamine intoxication ([place]) 12/20/2019  . AKI (acute kidney injury) ([place]) 12/20/2019  . Rhabdomyolysis 12/20/2019  . Elevated transaminase level 12/20/2019  . Elevated CK   . Methamphetamine use disorder, severe, dependence ([place]) 07/10/2019  . 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 MDD (major depressive disorder), recurrent severe, without psychosis ([place]) 07/09/2019  . HIV (human immunodeficiency virus infection) ([place]) 06/05/2019    History reviewed. No pertinent surgical history.     Family History  Problem Relation Age of Onset  . Diabetes Mother   . Hypertension Mother   . Cancer Mother   . Cancer Father   . Diabetes Father   .  Hypertension Father     Social History   Tobacco Use  . Smoking status: Current Every Day Smoker    Packs/day: 1.00    Years: 10.00    Pack years: 10.00    Types: Cigarettes  . Smokeless tobacco: Never Used  Vaping Use  . Vaping Use: Never used  Substance Use Topics  . Alcohol use: Yes  . Drug use: Yes    Types: Marijuana, Methamphetamines    Comment: last used this am     Home Medications Prior to Admission medications   Medication Sig Start Date End Date Taking? Authorizing Provider  omeprazole (PRILOSEC) 20 MG capsule Take 1 capsule (20 mg total) by mouth daily. Patient not taking: Reported on 02/14/2019 06/24/18 02/14/19  Montine Circle, PA-C    Allergies    Patient has no known allergies.  Review of Systems   Review of Systems  Constitutional: Negative for fever.  HENT: Negative for sore throat.   Respiratory: Negative for shortness of breath.   Cardiovascular: Negative for chest pain.  Gastrointestinal: Negative for abdominal pain.  Genitourinary: Negative for dysuria.  Musculoskeletal: Negative for neck pain.  Skin: Negative for rash.  Neurological: Positive for weakness. Negative for loss of consciousness and headaches.    Physical Exam Updated Vital Signs BP 124/87 (BP Location: Right Arm)   Pulse 96   Temp 98.1 F (36.7 C) (Oral)   Resp 12   Ht 6\' 1"  (1.854 m)   Wt  77.1 kg   SpO2 100%   BMI 22.43 kg/m   Physical Exam Vitals and nursing note reviewed.  Constitutional:      Appearance: Normal appearance. He is well-developed.  HENT:     Head: Normocephalic and atraumatic.  Eyes:     Conjunctiva/sclera: Conjunctivae normal.  Cardiovascular:     Rate and Rhythm: Regular rhythm. Tachycardia present.     Pulses: Normal pulses.     Heart sounds: No murmur heard.   Pulmonary:     Effort: Pulmonary effort is normal. No respiratory distress.     Breath sounds: Normal breath sounds.  Abdominal:     Palpations: Abdomen is soft.     Tenderness:  There is no abdominal tenderness.  Musculoskeletal:        General: No deformity or signs of injury. Normal range of motion.     Cervical back: Neck supple.  Skin:    General: Skin is warm and dry.     Capillary Refill: Capillary refill takes less than 2 seconds.  Neurological:     General: No focal deficit present.     Mental Status: He is alert.     Sensory: No sensory deficit.     Motor: No weakness.     ED Results / Procedures / Treatments   Labs (all labs ordered are listed, but only abnormal results are displayed) Labs Reviewed  COMPREHENSIVE METABOLIC PANEL - Abnormal; Notable for the following components:      Result Value   Potassium 3.2 (*)    Glucose, Bld 110 (*)    Creatinine, Ser 1.49 (*)    Total Protein 9.2 (*)    AST 60 (*)    ALT 48 (*)    All other components within normal limits  ETHANOL  CBC    EKG EKG Interpretation  Date/Time:  Sunday January 05 2020 18:13:28 EDT Ventricular Rate:  119 PR Interval:  156 QRS Duration: 102 QT Interval:  328 QTC Calculation: 461 R Axis:   96 Text Interpretation: Sinus tachycardia Biatrial enlargement Rightward axis Pulmonary disease pattern Incomplete right bundle branch block Left ventricular hypertrophy ( Sokolow-Lyon ) Abnormal ECG No significant change since prior 6/21 Confirmed by Meridee Score [phone]) on 01/05/2020 9:18:59 PM   Radiology No results found.  Procedures Procedures (including critical care time)  Medications Ordered in ED Medications  potassium chloride SA (KLOR-CON) CR tablet 20 mEq (20 mEq Oral Given 01/05/20 2123)  LORazepam (ATIVAN) tablet 1 mg (1 mg Oral Given 01/05/20 2123)    ED Course  I have reviewed the triage vital signs and the nursing notes.  Pertinent labs & imaging results that were available during my care of the patient were reviewed by me and considered in my medical decision making (see chart for details).  Clinical Course as of Jan 06 1128  Sun Jan 05, 2020  2135 I  put the patient up for a p.o. challenge and some oral potassium and Ativan.  When I went back to the room and found he had already eloped.   [MB]    Clinical Course User Index [MB] Terrilee Files, MD   MDM Rules/Calculators/A&P                         This patient complains of generalized weakness in the setting of new HIV diagnosis; this involves an extensive number of treatment Options and is a complaint that carries with it a high risk of complications  and Morbidity. The differential includes metabolic derangement, substance abuse, infection, anemia  I ordered, reviewed and interpreted labs, which included CBC with normal white count normal hemoglobin, chemistries with mildly low potassium 3.2 and elevated creatinine from baseline.  Slight elevation of AST and ALT likely from his hepatitis. I ordered medication Ativan and potassium  Previous records obtained and reviewed in epic.  He had one prior HIV test months prior that showed significant viral load.  After the interventions stated above, I reevaluated the patient and found that he had eloped from department.  Medications have not been administered.'  Final Clinical Impression(s) / ED Diagnoses Final diagnoses:  Weakness  Hypokalemia  Substance abuse (HCC)  HIV infection, unspecified symptom status (HCC)    Rx / DC Orders ED Discharge Orders    None       Terrilee Files, MD 01/06/20 1132

## 2020-01-06 DIAGNOSIS — M79605 Pain in left leg: Secondary | ICD-10-CM | POA: Insufficient documentation

## 2020-01-06 DIAGNOSIS — Z5321 Procedure and treatment not carried out due to patient leaving prior to being seen by health care provider: Secondary | ICD-10-CM | POA: Insufficient documentation

## 2020-01-06 DIAGNOSIS — M545 Low back pain: Secondary | ICD-10-CM | POA: Insufficient documentation

## 2020-01-06 DIAGNOSIS — M79604 Pain in right leg: Secondary | ICD-10-CM | POA: Insufficient documentation

## 2020-01-07 ENCOUNTER — Emergency Department (HOSPITAL_COMMUNITY)
Admission: EM | Admit: 2020-01-07 | Discharge: 2020-01-07 | Disposition: A | Discharge status: Home / Self Care | Payer: Self-pay | Attending: Emergency Medicine | Admitting: Emergency Medicine

## 2020-01-07 ENCOUNTER — Other Ambulatory Visit: Payer: Self-pay

## 2020-01-07 ENCOUNTER — Encounter (HOSPITAL_COMMUNITY): Payer: Self-pay

## 2020-01-07 NOTE — ED Notes (Signed)
Pt left labels in w/c and is no longer present in the department. Will be removed from system.

## 2020-01-07 NOTE — ED Triage Notes (Signed)
Patient arrived stating has been walking a lot and now having lower back and bilateral leg pain.

## 2020-01-08 ENCOUNTER — Ambulatory Visit: Payer: Self-pay | Admitting: Internal Medicine

## 2020-01-08 ENCOUNTER — Ambulatory Visit: Payer: Self-pay | Admitting: Pharmacist

## 2020-01-09 ENCOUNTER — Other Ambulatory Visit: Payer: Self-pay

## 2020-01-09 ENCOUNTER — Emergency Department (HOSPITAL_COMMUNITY)
Admission: EM | Admit: 2020-01-09 | Discharge: 2020-01-09 | Disposition: A | Discharge status: Home / Self Care | Payer: Self-pay | Attending: Emergency Medicine | Admitting: Emergency Medicine

## 2020-01-09 ENCOUNTER — Encounter (HOSPITAL_COMMUNITY): Payer: Self-pay

## 2020-01-09 DIAGNOSIS — M79672 Pain in left foot: Secondary | ICD-10-CM | POA: Insufficient documentation

## 2020-01-09 DIAGNOSIS — M79671 Pain in right foot: Secondary | ICD-10-CM | POA: Insufficient documentation

## 2020-01-09 DIAGNOSIS — F17210 Nicotine dependence, cigarettes, uncomplicated: Secondary | ICD-10-CM | POA: Insufficient documentation

## 2020-01-09 DIAGNOSIS — K0889 Other specified disorders of teeth and supporting structures: Secondary | ICD-10-CM | POA: Insufficient documentation

## 2020-01-09 DIAGNOSIS — F1290 Cannabis use, unspecified, uncomplicated: Secondary | ICD-10-CM | POA: Insufficient documentation

## 2020-01-09 MED ORDER — ACETAMINOPHEN 500 MG PO TABS
1000.0000 mg | ORAL_TABLET | Freq: Once | ORAL | Status: AC
  Administered 2020-01-09: 1000 mg via ORAL
  Filled 2020-01-09: qty 2

## 2020-01-09 NOTE — ED Provider Notes (Signed)
[place] DEPT Provider Note   CSN: [id number] Arrival date & time: 01/09/20  0022     History Chief Complaint  Patient presents with  . Ankle Pain    [name] is a 34 y.o. male.  Patient states he is here because both of his feet hurt after walking from downtime. 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 No chest pain or shortness of breath. No trauma. No rashes. No other associated symptoms.   Ankle Pain      Past Medical History:  Diagnosis Date  . ADHD (attention deficit hyperactivity disorder)   . Drug abuse ([place])    meth, marijuana  . HIV positive [facility])     Patient Active Problem List   Diagnosis Date Noted  . Methamphetamine intoxication ([place]) 12/20/2019  . AKI (acute kidney injury) ([place]) 12/20/2019  . Rhabdomyolysis 12/20/2019  . Elevated transaminase level 12/20/2019  . Elevated CK   . Methamphetamine use disorder, severe, dependence ([place]) 07/10/2019  . 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 MDD (major depressive disorder), recurrent severe, without psychosis ([place]) 07/09/2019  . HIV (human immunodeficiency virus infection) ([place]) 06/05/2019    History reviewed. No pertinent surgical history.     Family History  Problem Relation Age of Onset  . Diabetes Mother   . Hypertension Mother   . Cancer Mother   . Cancer Father   . Diabetes Father   . Hypertension Father     Social History   Tobacco Use  . Smoking status: Current Every Day Smoker    Packs/day: 1.00    Years: 10.00    Pack years: 10.00    Types: Cigarettes  . Smokeless tobacco: Never Used  Vaping Use  . Vaping Use: Never used  Substance Use Topics  . Alcohol use: Yes  . Drug use: Yes    Types: Marijuana, Methamphetamines    Comment: last used this am     Home Medications Prior to Admission medications   Medication Sig Start  Date End Date Taking? Authorizing Provider  omeprazole (PRILOSEC) 20 MG capsule Take 1 capsule (20 mg total) by mouth daily. Patient not taking: Reported on 02/14/2019 06/24/18 02/14/19  Montine Circle, PA-C    Allergies    Patient has no known allergies.  Review of Systems   Review of Systems  All other systems reviewed and are negative.   Physical Exam Updated Vital Signs BP 130/88 (BP Location: Left Arm)   Pulse 68   Temp 97.8 F (36.6 C) (Oral)   Resp 17   SpO2 100%   Physical Exam Vitals and nursing note reviewed.  Constitutional:      Appearance: He is well-developed.  HENT:     Head: Normocephalic and atraumatic.     Mouth/Throat:     Mouth: Mucous membranes are dry.     Pharynx: Oropharynx is clear.  Eyes:     Conjunctiva/sclera: Conjunctivae normal.     Pupils: Pupils are equal, round, and reactive to light.  Cardiovascular:     Rate and Rhythm: Normal rate.  Pulmonary:     Effort: Pulmonary effort is normal. No respiratory distress.  Abdominal:     General: There is no distension.  Musculoskeletal:        General: No swelling, tenderness, deformity or signs of injury. Normal range of motion.     Cervical back: Normal  range of motion.     Right lower leg: No edema.     Left lower leg: No edema.  Skin:    General: Skin is warm.  Neurological:     General: No focal deficit present.     Mental Status: He is alert.     ED Results / Procedures / Treatments   Labs (all labs ordered are listed, but only abnormal results are displayed) Labs Reviewed - No data to display  EKG None  Radiology No results found.  Procedures Procedures (including critical care time)  Medications Ordered in ED Medications  acetaminophen (TYLENOL) tablet 1,000 mg (1,000 mg Oral Given 01/09/20 0124)    ED Course  I have reviewed the triage vital signs and the nursing notes.  Pertinent labs & imaging results that were available during my care of the patient were  reviewed by me and considered in my medical decision making (see chart for details).    MDM Rules/Calculators/A&P                          Low suspicion for emergent cause. Tylenol given discharge provided.  Final Clinical Impression(s) / ED Diagnoses Final diagnoses:  Foot pain, bilateral    Rx / DC Orders ED Discharge Orders    None       Jailani Hogans, Barbara Cower, MD 01/09/20 [phone]

## 2020-01-09 NOTE — ED Triage Notes (Signed)
Patient arrived stating he has bilateral ankle pain that started an hour ago. Declines any injury. Also states he has right side dental pain.

## 2020-01-13 ENCOUNTER — Ambulatory Visit: Payer: Self-pay | Admitting: Pharmacist

## 2020-01-13 ENCOUNTER — Ambulatory Visit (INDEPENDENT_AMBULATORY_CARE_PROVIDER_SITE_OTHER): Payer: Self-pay | Admitting: Infectious Disease

## 2020-01-13 ENCOUNTER — Encounter: Payer: Self-pay | Admitting: Infectious Disease

## 2020-01-13 ENCOUNTER — Other Ambulatory Visit: Payer: Self-pay

## 2020-01-13 VITALS — BP 143/97 | HR 102 | Temp 98.4°F | Ht 73.0 in | Wt 177.0 lb

## 2020-01-13 DIAGNOSIS — M6282 Rhabdomyolysis: Secondary | ICD-10-CM

## 2020-01-13 DIAGNOSIS — F332 Major depressive disorder, recurrent severe without psychotic features: Secondary | ICD-10-CM

## 2020-01-13 DIAGNOSIS — B182 Chronic viral hepatitis C: Secondary | ICD-10-CM

## 2020-01-13 DIAGNOSIS — B20 Human immunodeficiency virus [HIV] disease: Secondary | ICD-10-CM

## 2020-01-13 DIAGNOSIS — F1520 Other stimulant dependence, uncomplicated: Secondary | ICD-10-CM

## 2020-01-13 MED ORDER — DOVATO 50-300 MG PO TABS: 1.0000 | ORAL_TABLET | Freq: Every day | ORAL | 6 refills | Status: DC

## 2020-01-13 NOTE — Progress Notes (Signed)
Subjective:   Chief complaint:" how bad is my HIV?"  Patient ID: [name], male    DOB: 03-Jul-1986, 33 y.o.   MRN: [id number]  HPI  34 year old Caucasian man recently diagnosed with HIV disease.  He has been difficult to get engaged in care, and most recent labs showed his viral load to be above 200,000 but fortunately CD4 count was not that low.  He has hepatitis C positive as well.  He has a known history of methamphetamine abuse claiming largely that this is not intravenous but through smoking it.  He was recently seen in the ER weakness.  He had had elevated CPK and acute renal insufficiency as well.  He is currently homeless and still having trouble with substance abuse.  He has filled out paperwork for the HIV medication assistance program . Past Medical History:  Diagnosis Date  . ADHD (attention deficit hyperactivity disorder)   . Drug abuse ([place])    meth, marijuana  . HIV positive ([place])     No past surgical history on file.  Family History  Problem Relation Age of Onset  . Diabetes Mother   . Hypertension Mother   . Cancer Mother   . Cancer Father   . Diabetes Father   . Hypertension Father       Social History   Socioeconomic History  . Marital status: Single    Spouse name: Not on file  . Number of children: Not on file  . Years of education: Not on file  . Highest education level: Not on file  Occupational History  . Not on file  Tobacco Use  . Smoking status: Current Every Day Smoker    Packs/day: 1.00    Years: 10.00    Pack years: 10.00    Types: Cigarettes  . Smokeless tobacco: Never Used  Vaping Use  . Vaping Use: Never used  Substance and Sexual Activity  . Alcohol use: Not Currently  . Drug use: Yes    Types: Marijuana, Methamphetamines    Comment: last used this am   . Sexual activity: Yes    Birth control/protection: None    Comment: offered condoms  Other Topics Concern  . Not on file  Social History Narrative  . Not on file     Social Determinants of Health   Financial Resource Strain:   . Difficulty of Paying Living Expenses:   Food Insecurity:   . Worried About Charity fundraiser in the Last Year:   . Arboriculturist in the Last Year:   Transportation Needs:   . Film/video editor (Medical):   Marland Kitchen Lack of Transportation (Non-Medical):   Physical Activity:   . Days of Exercise per Week:   . Minutes of Exercise per Session:   Stress:   . Feeling of Stress :   Social Connections:   . Frequency of Communication with Friends and Family:   . Frequency of Social Gatherings with Friends and Family:   . Attends Religious Services:   . Active Member of Clubs or Organizations:   . Attends Archivist Meetings:   Marland Kitchen Marital Status:     No Known Allergies   Current Outpatient Medications:  .  Dolutegravir-lamiVUDine (DOVATO) 50-300 MG TABS, Take 1 tablet by mouth daily., Disp: 30 tablet, Rfl: 6 .  naloxone (NARCAN) 2 MG/2ML injection, For suspected opioid overdose, spray 1 mL in each nostril.  Repeat after 3 minutes if no or minimal response. (Patient not  taking: Reported on 01/13/2020), Disp: , Rfl:     Review of Systems  Constitutional: Negative for chills and fever.  HENT: Negative for congestion and sore throat.   Eyes: Negative for photophobia.  Respiratory: Negative for cough, shortness of breath and wheezing.   Cardiovascular: Negative for chest pain, palpitations and leg swelling.  Gastrointestinal: Negative for abdominal pain, blood in stool, constipation, diarrhea, nausea and vomiting.  Genitourinary: Negative for dysuria, flank pain and hematuria.  Musculoskeletal: Negative for back pain and myalgias.  Skin: Negative for rash.  Neurological: Negative for dizziness, weakness and headaches.  Hematological: Does not bruise/bleed easily.  Psychiatric/Behavioral: Positive for agitation and decreased concentration. Negative for suicidal ideas. The patient is nervous/anxious.         Objective:   Physical Exam Constitutional:      Appearance: He is well-developed.  HENT:     Head: Normocephalic and atraumatic.  Eyes:     Conjunctiva/sclera: Conjunctivae normal.  Cardiovascular:     Rate and Rhythm: Normal rate and regular rhythm.     Heart sounds: No murmur heard.  No friction rub. No gallop.   Pulmonary:     Effort: Pulmonary effort is normal. No respiratory distress.     Breath sounds: Normal breath sounds. No stridor. No wheezing.  Abdominal:     General: There is no distension.     Palpations: Abdomen is soft.  Musculoskeletal:        General: No tenderness. Normal range of motion.     Cervical back: Normal range of motion and neck supple.  Skin:    General: Skin is warm and dry.     Coloration: Skin is not pale.     Findings: No erythema or rash.  Neurological:     Mental Status: He is alert and oriented to person, place, and time.  Psychiatric:        Attention and Perception: Attention and perception normal.        Mood and Affect: Mood is anxious.        Speech: Speech normal.        Behavior: Behavior normal.        Thought Content: Thought content normal.        Cognition and Memory: Cognition and memory normal.        Judgment: Judgment normal.           Assessment & Plan:  HIV disease: Start Dovato I had him take his first pill in clinic today he is to go to [facility] on [address] and procure medications we will check labs again in a month's time and see him in roughly 6 weeks time  Hepatitis C seropositivity check hep C RNA and genotype  Polysubstance abuse including methamphetamine abuse: This certainly may be a major problem for him I am hoping that he can at least be adherent to his antiretrovirals.  Homelessness hopefully we can help him with this as well again the substance abuse may be an issue    Need for COVID-19 vaccination I have like him to get vaccinated for COVID-19 using an MRI RNA  Vaccine continue other  vaccinations that he needs

## 2020-01-14 LAB — URINALYSIS, ROUTINE W REFLEX MICROSCOPIC
Bacteria, UA: NONE SEEN /HPF
Bilirubin Urine: NEGATIVE
Glucose, UA: NEGATIVE
Hgb urine dipstick: NEGATIVE
Hyaline Cast: NONE SEEN /LPF
Leukocytes,Ua: NEGATIVE
Nitrite: NEGATIVE
RBC / HPF: NONE SEEN /HPF (ref 0–2)
Specific Gravity, Urine: 1.027 (ref 1.001–1.03)
Squamous Epithelial / HPF: NONE SEEN /HPF (ref <=5)
pH: 6 (ref 5.0–8.0)

## 2020-01-14 LAB — URINE CYTOLOGY ANCILLARY ONLY
Chlamydia: NEGATIVE
Comment: NEGATIVE
Comment: NORMAL
Neisseria Gonorrhea: NEGATIVE

## 2020-01-14 LAB — T-HELPER CELL (CD4) - (RCID CLINIC ONLY)
CD4 % Helper T Cell: 30 % — ABNORMAL LOW (ref 33–65)
CD4 T Cell Abs: 403 /uL (ref 400–1790)

## 2020-01-14 LAB — CYTOLOGY, (ORAL, ANAL, URETHRAL) ANCILLARY ONLY
Chlamydia: NEGATIVE
Chlamydia: POSITIVE — AB
Comment: NEGATIVE
Comment: NEGATIVE
Comment: NORMAL
Comment: NORMAL
Neisseria Gonorrhea: NEGATIVE
Neisseria Gonorrhea: NEGATIVE

## 2020-01-15 ENCOUNTER — Telehealth: Payer: Self-pay

## 2020-01-15 NOTE — Telephone Encounter (Signed)
Called patient to inform him of recent test results and medication instructions. Unable to leave voicemail. Will try again later.   Kindred Reidinger Loyola Mast, RN

## 2020-01-16 ENCOUNTER — Encounter: Payer: Self-pay | Admitting: Infectious Disease

## 2020-01-16 NOTE — Telephone Encounter (Signed)
Attempted to contact patient x2; forwarding information to [facility].   Duron Meister Loyola Mast, RN

## 2020-01-17 LAB — CBC WITH DIFFERENTIAL/PLATELET
Absolute Monocytes: 388 cells/uL (ref 200–950)
Basophils Absolute: 20 cells/uL (ref 0–200)
Basophils Relative: 0.5 %
Eosinophils Absolute: 180 cells/uL (ref 15–500)
Eosinophils Relative: 4.5 %
HCT: 39.2 % (ref 38.5–50.0)
Hemoglobin: 13.4 g/dL (ref 13.2–17.1)
Lymphs Abs: 1456 cells/uL (ref 850–3900)
MCH: 29.8 pg (ref 27.0–33.0)
MCHC: 34.2 g/dL (ref 32.0–36.0)
MCV: 87.1 fL (ref 80.0–100.0)
MPV: 9.5 fL (ref 7.5–12.5)
Monocytes Relative: 9.7 %
Neutro Abs: 1956 cells/uL (ref 1500–7800)
Neutrophils Relative %: 48.9 %
Platelets: 262 10*3/uL (ref 140–400)
RBC: 4.5 10*6/uL (ref 4.20–5.80)
RDW: 13.8 % (ref 11.0–15.0)
Total Lymphocyte: 36.4 %
WBC: 4 10*3/uL (ref 3.8–10.8)

## 2020-01-17 LAB — HEPATITIS C RNA QUANTITATIVE
HCV Quantitative Log: 6.3 Log IU/mL — ABNORMAL HIGH
HCV RNA, PCR, QN: 2000000 IU/mL — ABNORMAL HIGH

## 2020-01-17 LAB — RPR TITER: RPR Titer: 1:2 {titer} — ABNORMAL HIGH

## 2020-01-17 LAB — HEPATITIS C GENOTYPE

## 2020-01-17 LAB — FLUORESCENT TREPONEMAL AB(FTA)-IGG-BLD: Fluorescent Treponemal ABS: REACTIVE — AB

## 2020-01-17 LAB — RPR: RPR Ser Ql: REACTIVE — AB

## 2020-01-17 LAB — HIV-1 RNA QUANT-NO REFLEX-BLD
HIV 1 RNA Quant: 292000 copies/mL — ABNORMAL HIGH
HIV-1 RNA Quant, Log: 5.47 Log copies/mL — ABNORMAL HIGH

## 2020-02-19 ENCOUNTER — Other Ambulatory Visit: Payer: Self-pay

## 2020-03-04 ENCOUNTER — Encounter: Payer: Self-pay | Admitting: Infectious Disease

## 2020-04-22 ENCOUNTER — Other Ambulatory Visit: Payer: Self-pay

## 2020-04-22 DIAGNOSIS — B20 Human immunodeficiency virus [HIV] disease: Secondary | ICD-10-CM

## 2020-04-28 ENCOUNTER — Other Ambulatory Visit: Payer: Self-pay

## 2020-05-12 ENCOUNTER — Ambulatory Visit (INDEPENDENT_AMBULATORY_CARE_PROVIDER_SITE_OTHER): Payer: Self-pay | Admitting: Infectious Disease

## 2020-05-12 ENCOUNTER — Other Ambulatory Visit: Payer: Self-pay

## 2020-05-12 ENCOUNTER — Encounter: Payer: Self-pay | Admitting: Infectious Disease

## 2020-05-12 VITALS — BP 148/97 | HR 82 | Temp 98.0°F | Ht 73.0 in | Wt 177.0 lb

## 2020-05-12 DIAGNOSIS — B20 Human immunodeficiency virus [HIV] disease: Secondary | ICD-10-CM

## 2020-05-12 DIAGNOSIS — F332 Major depressive disorder, recurrent severe without psychotic features: Secondary | ICD-10-CM

## 2020-05-12 DIAGNOSIS — F1520 Other stimulant dependence, uncomplicated: Secondary | ICD-10-CM

## 2020-05-12 DIAGNOSIS — B182 Chronic viral hepatitis C: Secondary | ICD-10-CM

## 2020-05-12 HISTORY — DX: Chronic viral hepatitis C: B18.2

## 2020-05-12 NOTE — Progress Notes (Signed)
Subjective:   Chief complaint: "can you treat my hepatitis C?"   Patient ID: [name], male    DOB: 04-17-1986, 34 y.o.   MRN: [id number]  HPI   34 year old Caucasian man recently diagnosed with HIV disease.  He has been difficult to get engaged in care, and most recent labs showed his viral load to be above 200,000 but fortunately CD4 count was not that low.  He has hepatitis C positive as well.  He has a known history of methamphetamine abuse claiming largely that this is not intravenous but through smoking it.  I started him on DOvato and he took this for a while including while he was incarcerated but then later stopped this when he felt like he was out of breath and attributed ths to DOvato. Now back on it x 5 days.  He refuses COVID vaccination wondering how this virus "came out of nowhwere" Something that is ironic in that his current HIV infection also "came out of no-where in the 1970's and 80's. . Past Medical History:  Diagnosis Date  . ADHD (attention deficit hyperactivity disorder)   . Drug abuse (HCC)    meth, marijuana  . HIV positive (HCC)     No past surgical history on file.  Family History  Problem Relation Age of Onset  . Diabetes Mother   . Hypertension Mother   . Cancer Mother   . Cancer Father   . Diabetes Father   . Hypertension Father       Social History   Socioeconomic History  . Marital status: Single    Spouse name: Not on file  . Number of children: Not on file  . Years of education: Not on file  . Highest education level: Not on file  Occupational History  . Not on file  Tobacco Use  . Smoking status: Current Every Day Smoker    Packs/day: 1.00    Years: 10.00    Pack years: 10.00    Types: Cigarettes  . Smokeless tobacco: Never Used  Vaping Use  . Vaping Use: Never used  Substance and Sexual Activity  . Alcohol use: Not Currently  . Drug use: Yes    Types: Methamphetamines    Comment: last used yesterday  . Sexual  activity: Yes    Birth control/protection: None    Comment: offered condoms  Other Topics Concern  . Not on file  Social History Narrative  . Not on file   Social Determinants of Health   Financial Resource Strain:   . Difficulty of Paying Living Expenses: Not on file  Food Insecurity:   . Worried About Programme researcher, broadcasting/film/video in the Last Year: Not on file  . Ran Out of Food in the Last Year: Not on file  Transportation Needs:   . Lack of Transportation (Medical): Not on file  . Lack of Transportation (Non-Medical): Not on file  Physical Activity:   . Days of Exercise per Week: Not on file  . Minutes of Exercise per Session: Not on file  Stress:   . Feeling of Stress : Not on file  Social Connections:   . Frequency of Communication with Friends and Family: Not on file  . Frequency of Social Gatherings with Friends and Family: Not on file  . Attends Religious Services: Not on file  . Active Member of Clubs or Organizations: Not on file  . Attends Banker Meetings: Not on file  . Marital Status: Not on file  No Known Allergies   Current Outpatient Medications:  .  Dolutegravir-lamiVUDine (DOVATO) 50-300 MG TABS, Take 1 tablet by mouth daily., Disp: 30 tablet, Rfl: 6 .  naloxone (NARCAN) 2 MG/2ML injection, For suspected opioid overdose, spray 1 mL in each nostril.  Repeat after 3 minutes if no or minimal response. (Patient not taking: Reported on 01/13/2020), Disp: , Rfl:     Review of Systems  Constitutional: Negative for chills and fever.  HENT: Negative for congestion and sore throat.   Eyes: Negative for photophobia.  Respiratory: Negative for cough, shortness of breath and wheezing.   Cardiovascular: Negative for chest pain, palpitations and leg swelling.  Gastrointestinal: Negative for abdominal pain, blood in stool, constipation, diarrhea, nausea and vomiting.  Genitourinary: Negative for dysuria, flank pain and hematuria.  Musculoskeletal: Negative for  back pain and myalgias.  Skin: Negative for rash.  Neurological: Negative for dizziness, weakness and headaches.  Hematological: Does not bruise/bleed easily.  Psychiatric/Behavioral: Positive for agitation and decreased concentration. Negative for suicidal ideas. The patient is nervous/anxious.        Objective:   Physical Exam Constitutional:      Appearance: He is well-developed.  HENT:     Head: Normocephalic and atraumatic.  Eyes:     Conjunctiva/sclera: Conjunctivae normal.  Cardiovascular:     Rate and Rhythm: Normal rate and regular rhythm.     Heart sounds: No murmur heard.  No friction rub. No gallop.   Pulmonary:     Effort: Pulmonary effort is normal. No respiratory distress.     Breath sounds: Normal breath sounds. No stridor. No wheezing.  Abdominal:     General: There is no distension.     Palpations: Abdomen is soft.  Musculoskeletal:        General: No tenderness. Normal range of motion.     Cervical back: Normal range of motion and neck supple.  Skin:    General: Skin is warm and dry.     Coloration: Skin is not pale.     Findings: No erythema or rash.  Neurological:     Mental Status: He is alert and oriented to person, place, and time.  Psychiatric:        Attention and Perception: Attention and perception normal.        Mood and Affect: Mood is anxious.        Speech: Speech normal.        Behavior: Behavior normal.        Thought Content: Thought content normal.        Cognition and Memory: Cognition and memory normal.        Judgment: Judgment normal.           Assessment & Plan:  HIV disease: check labs and RTC in one month. I f he is nonadherent to Dovato will have low threshold to change over to [facility]   Polysubstance abuse including methamphetamine abuse: This certainly may be a major problem for him and contiues to be a barrier  HCV: will not address until his HIV is under control  COVID 19 prevention: really clearly should be  vaccinated. I am very disappointed in him re this.

## 2020-05-13 LAB — URINE CYTOLOGY ANCILLARY ONLY
Chlamydia: NEGATIVE
Comment: NEGATIVE
Comment: NORMAL
Neisseria Gonorrhea: NEGATIVE

## 2020-05-13 LAB — T-HELPER CELL (CD4) - (RCID CLINIC ONLY)
CD4 % Helper T Cell: 27 % — ABNORMAL LOW (ref 33–65)
CD4 T Cell Abs: 569 /uL (ref 400–1790)

## 2020-05-17 LAB — CBC WITH DIFFERENTIAL/PLATELET
Absolute Monocytes: 464 cells/uL (ref 200–950)
Basophils Absolute: 43 cells/uL (ref 0–200)
Basophils Relative: 0.7 %
Eosinophils Absolute: 207 cells/uL (ref 15–500)
Eosinophils Relative: 3.4 %
HCT: 42.8 % (ref 38.5–50.0)
Hemoglobin: 15 g/dL (ref 13.2–17.1)
Lymphs Abs: 2294 cells/uL (ref 850–3900)
MCH: 33.6 pg — ABNORMAL HIGH (ref 27.0–33.0)
MCHC: 35 g/dL (ref 32.0–36.0)
MCV: 96 fL (ref 80.0–100.0)
MPV: 9.5 fL (ref 7.5–12.5)
Monocytes Relative: 7.6 %
Neutro Abs: 3093 cells/uL (ref 1500–7800)
Neutrophils Relative %: 50.7 %
Platelets: 313 10*3/uL (ref 140–400)
RBC: 4.46 10*6/uL (ref 4.20–5.80)
RDW: 12.8 % (ref 11.0–15.0)
Total Lymphocyte: 37.6 %
WBC: 6.1 10*3/uL (ref 3.8–10.8)

## 2020-05-17 LAB — COMPLETE METABOLIC PANEL WITH GFR
AG Ratio: 1.2 (calc) (ref 1.0–2.5)
ALT: 122 U/L — ABNORMAL HIGH (ref 9–46)
AST: 61 U/L — ABNORMAL HIGH (ref 10–40)
Albumin: 4.4 g/dL (ref 3.6–5.1)
Alkaline phosphatase (APISO): 73 U/L (ref 36–130)
BUN: 14 mg/dL (ref 7–25)
CO2: 31 mmol/L (ref 20–32)
Calcium: 9.8 mg/dL (ref 8.6–10.3)
Chloride: 101 mmol/L (ref 98–110)
Creat: 0.99 mg/dL (ref 0.60–1.35)
GFR, Est African American: 115 mL/min/{1.73_m2} (ref >=60)
GFR, Est Non African American: 99 mL/min/{1.73_m2} (ref >=60)
Globulin: 3.8 g/dL (calc) — ABNORMAL HIGH (ref 1.9–3.7)
Glucose, Bld: 75 mg/dL (ref 65–99)
Potassium: 3.9 mmol/L (ref 3.5–5.3)
Sodium: 138 mmol/L (ref 135–146)
Total Bilirubin: 0.5 mg/dL (ref 0.2–1.2)
Total Protein: 8.2 g/dL — ABNORMAL HIGH (ref 6.1–8.1)

## 2020-05-17 LAB — RPR TITER: RPR Titer: 1:2 {titer} — ABNORMAL HIGH

## 2020-05-17 LAB — FLUORESCENT TREPONEMAL AB(FTA)-IGG-BLD: Fluorescent Treponemal ABS: REACTIVE — AB

## 2020-05-17 LAB — HIV RNA, RTPCR W/R GT (RTI, PI,INT)
HIV 1 RNA Quant: 371 copies/mL — ABNORMAL HIGH
HIV-1 RNA Quant, Log: 2.57 Log copies/mL — ABNORMAL HIGH

## 2020-05-17 LAB — HEPATITIS C RNA QUANTITATIVE
HCV RNA, PCR, QN (Log): 6.22 log IU/mL — ABNORMAL HIGH
HCV RNA, PCR, QN: 1670000 IU/mL — ABNORMAL HIGH

## 2020-05-17 LAB — RPR: RPR Ser Ql: REACTIVE — AB

## 2020-05-30 IMAGING — US US ABDOMEN LIMITED
1 series · 14 of 25 positions shown · non-contrast
Comparison: CT of the abdomen pelvis dated 06/24/2018

CLINICAL DATA: 32-year-old male with right upper quadrant abdominal
pain.

EXAM:
ULTRASOUND ABDOMEN LIMITED RIGHT UPPER QUADRANT

[Series 1: us abdomen limited · 0.15mm/px · 14 of 75 slices shown]
[im 1/75]
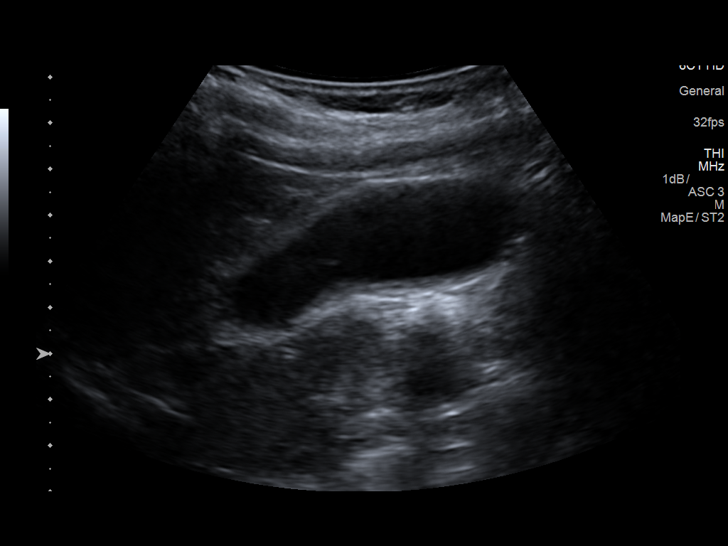
[im 7/75]
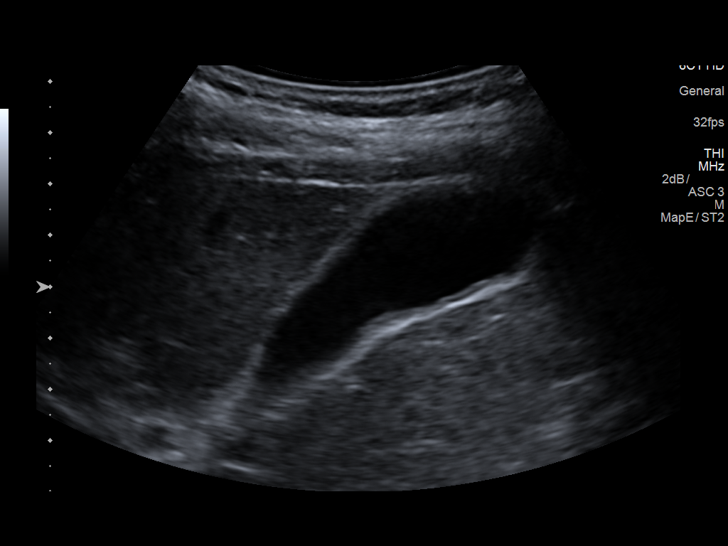
[im 13/75]
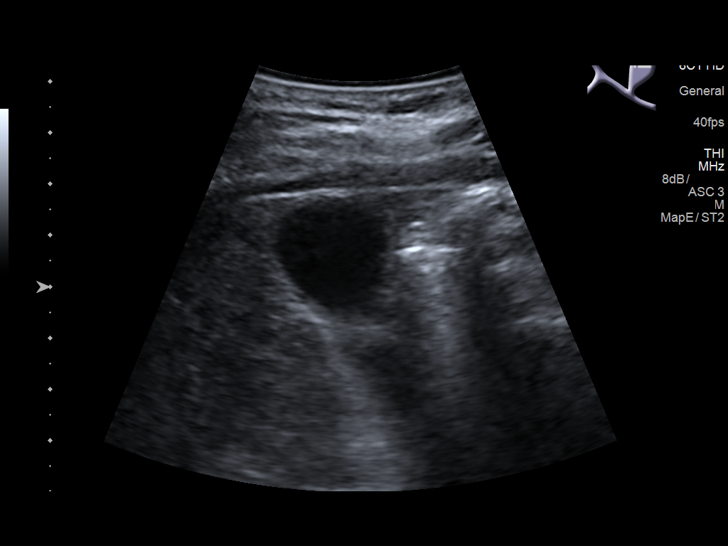
[im 19/75]
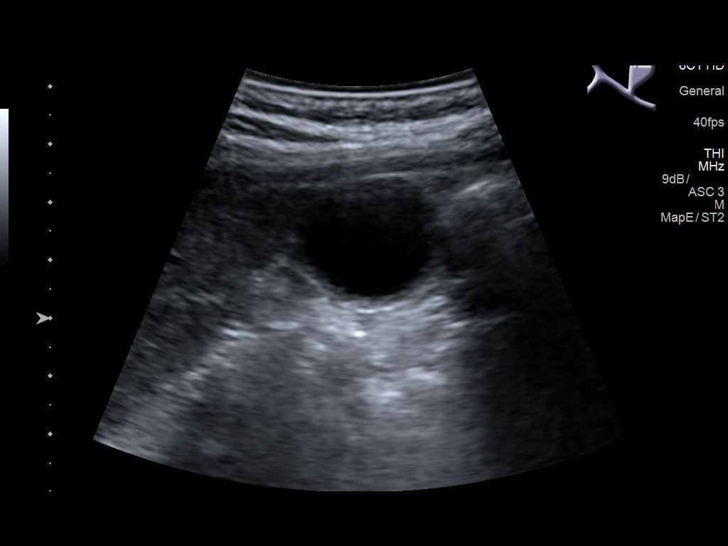
[im 25/75]
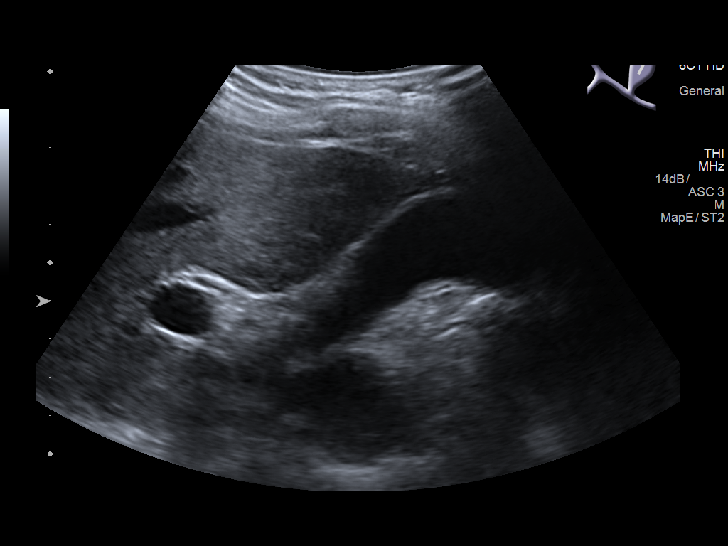
[im 28/75]
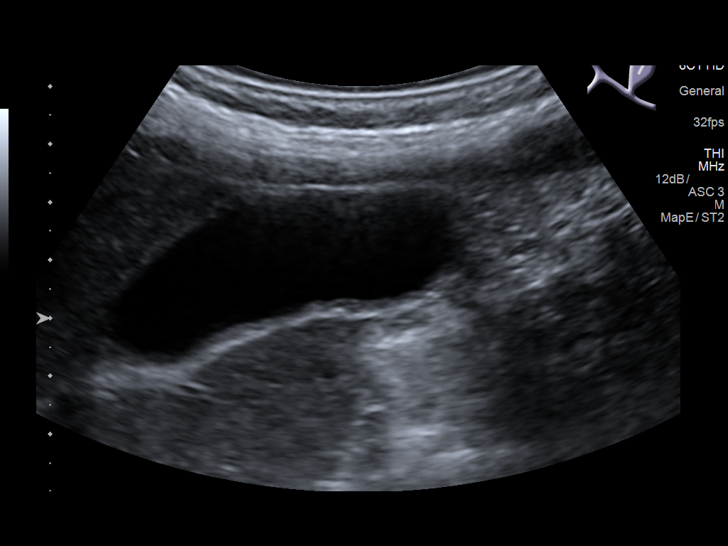
[im 34/75]
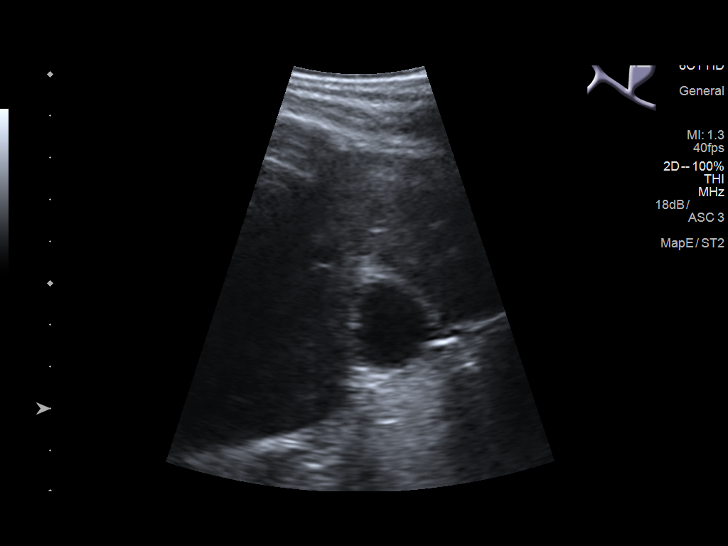
[im 41/75]
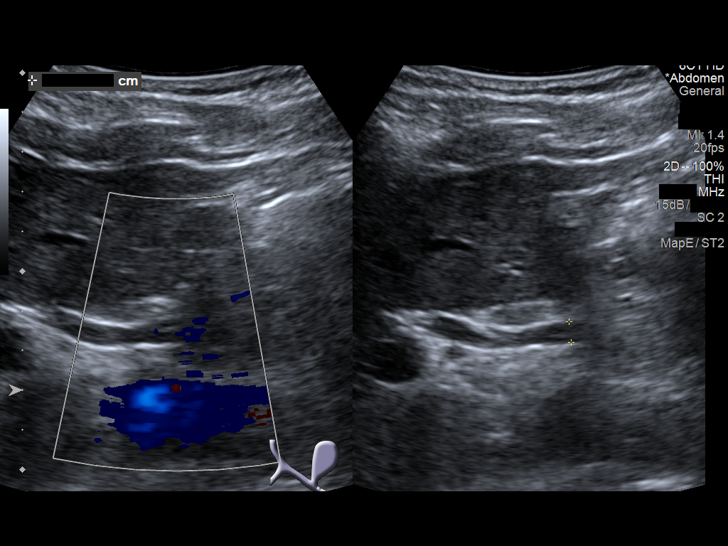
[im 47/75]
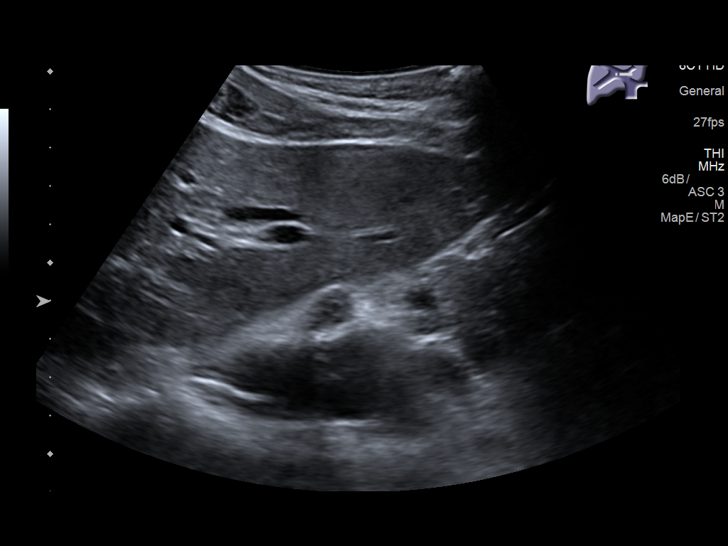
[im 50/75]
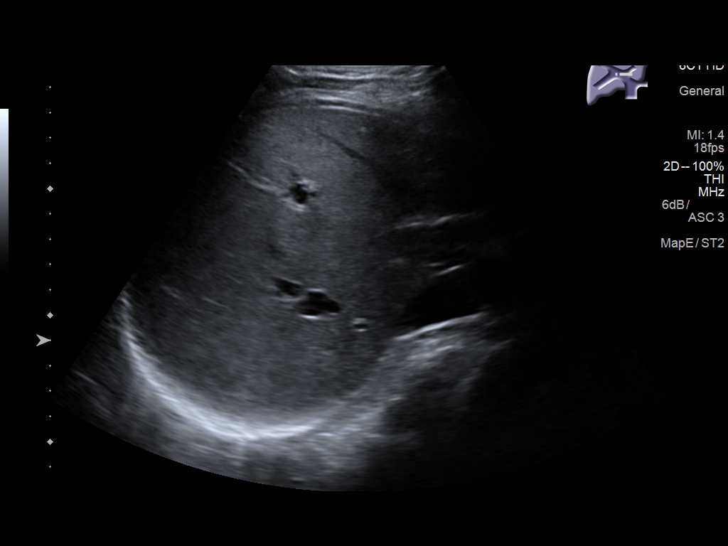
[im 56/75]
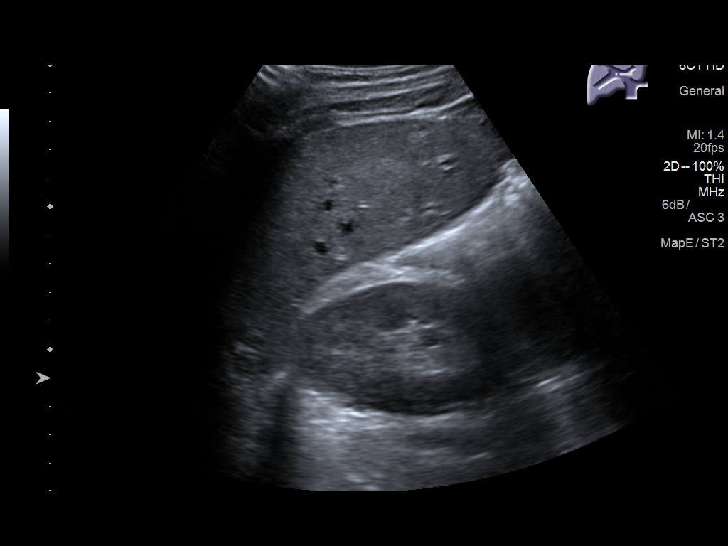
[im 62/75]
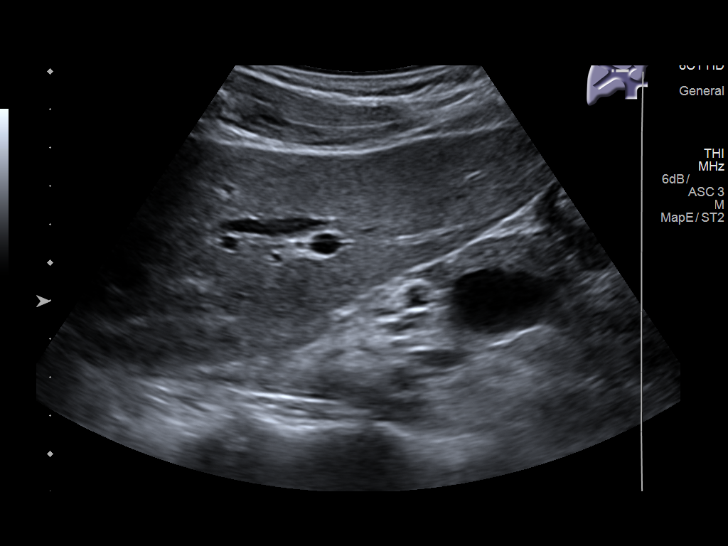
[im 68/75]
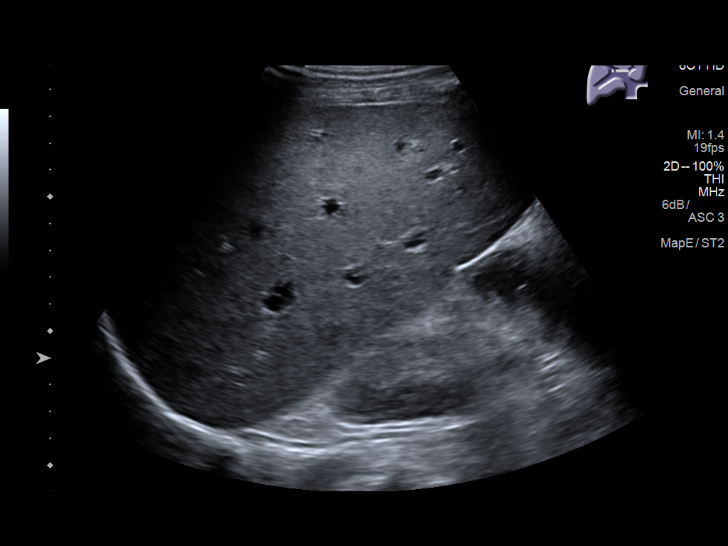
[im 75/75]
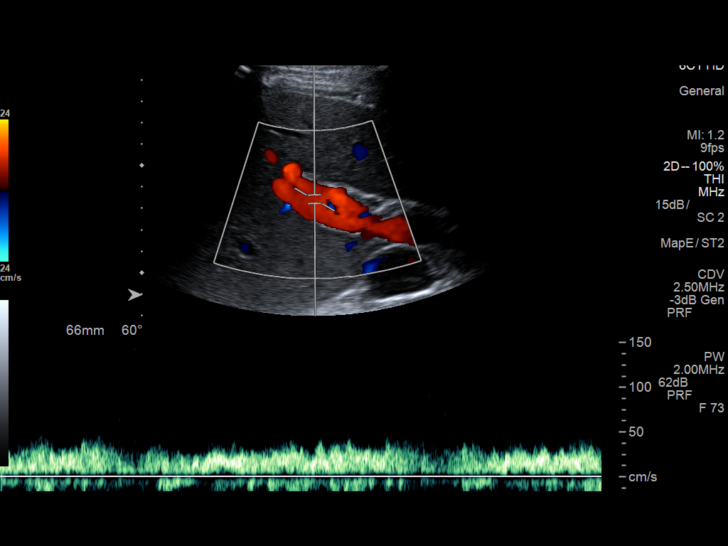

[14 of 25 positions shown; findings below may reference images not displayed]

FINDINGS: Gallbladder:

There is no gallstone, gallbladder wall thickening, or
pericholecystic fluid. Probable focal area of adenomyomatosis.
Negative sonographic Murphy's sign.

Common bile duct:

Diameter: 4 mm

Liver:

The liver is unremarkable. Portal vein is patent on color Doppler
imaging with normal direction of blood flow towards the liver.
IMPRESSION: Unremarkable right upper quadrant ultrasound.

## 2020-06-15 ENCOUNTER — Ambulatory Visit: Payer: Self-pay | Admitting: Infectious Disease

## 2020-06-25 ENCOUNTER — Ambulatory Visit: Payer: Self-pay

## 2020-07-07 ENCOUNTER — Ambulatory Visit: Payer: Self-pay | Admitting: Internal Medicine

## 2020-07-30 ENCOUNTER — Other Ambulatory Visit: Payer: Self-pay

## 2020-07-30 ENCOUNTER — Encounter (HOSPITAL_COMMUNITY): Payer: Self-pay | Admitting: Emergency Medicine

## 2020-07-30 ENCOUNTER — Emergency Department (HOSPITAL_COMMUNITY)
Admission: EM | Admit: 2020-07-30 | Discharge: 2020-07-30 | Discharge status: Against Medical Advice | Payer: Self-pay | Attending: Emergency Medicine | Admitting: Emergency Medicine

## 2020-07-30 DIAGNOSIS — Z21 Asymptomatic human immunodeficiency virus [HIV] infection status: Secondary | ICD-10-CM | POA: Insufficient documentation

## 2020-07-30 DIAGNOSIS — A539 Syphilis, unspecified: Secondary | ICD-10-CM | POA: Insufficient documentation

## 2020-07-30 DIAGNOSIS — F17210 Nicotine dependence, cigarettes, uncomplicated: Secondary | ICD-10-CM | POA: Insufficient documentation

## 2020-07-30 DIAGNOSIS — R21 Rash and other nonspecific skin eruption: Secondary | ICD-10-CM | POA: Insufficient documentation

## 2020-07-30 DIAGNOSIS — R202 Paresthesia of skin: Secondary | ICD-10-CM | POA: Insufficient documentation

## 2020-07-30 MED ORDER — PENICILLIN G BENZATHINE & PROC 1200000 UNIT/2ML IM SUSP
2.4000 10*6.[IU] | Freq: Once | INTRAMUSCULAR | Status: DC
  Filled 2020-07-30: qty 4

## 2020-07-30 NOTE — ED Provider Notes (Signed)
[name] EMERGENCY DEPARTMENT Provider Note   CSN: [id number] Arrival date & time: 07/30/20  1458     History Chief Complaint  Patient presents with  . Rash    [name] is a 35 y.o. male history of polysubstance abuse, HIV, hepatitis C.  Patient presents today for rash that presented around 5 days ago initially was on the palm of his right hand has since spread up to his mid forearm and is present on both the dorsal and volar aspects.  He reports a tingling burning sensation to the lesions which has been mild constant nonradiating no aggravating or alleviating factors.  Denies fever/chills, headaches, nausea/vomiting, cough, abdominal pain, recent IV drug use or any additional concerns  HPI     Past Medical History:  Diagnosis Date  . ADHD (attention deficit hyperactivity disorder)   . Chronic hepatitis C without hepatic coma (HCC) 05/12/2020  . Drug abuse (HCC)    meth, marijuana  . HIV positive [facility])     Patient Active Problem List   Diagnosis Date Noted  . Chronic hepatitis C without hepatic coma (HCC) 05/12/2020  . Methamphetamine intoxication (HCC) 12/20/2019  . AKI (acute kidney injury) (HCC) 12/20/2019  . Rhabdomyolysis 12/20/2019  . Elevated transaminase level 12/20/2019  . Elevated CK   . Methamphetamine use disorder, severe, dependence (HCC) 07/10/2019  . 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 HIV (human immunodeficiency virus infection) (HCC) 06/05/2019    History reviewed. No pertinent surgical history.     Family History  Problem Relation Age of Onset  . Diabetes Mother   . Hypertension Mother   . Cancer Mother   . Cancer Father   . Diabetes Father   . Hypertension Father     Social History   Tobacco Use  . Smoking status: Current Every Day Smoker    Packs/day: 1.00    Years: 10.00    Pack years: 10.00     Types: Cigarettes  . Smokeless tobacco: Never Used  Vaping Use  . Vaping Use: Never used  Substance Use Topics  . Alcohol use: Not Currently  . Drug use: Yes    Types: Methamphetamines    Comment: last used yesterday    Home Medications Prior to Admission medications   Medication Sig Start Date End Date Taking? Authorizing Provider  Dolutegravir-lamiVUDine (DOVATO) 50-300 MG TABS Take 1 tablet by mouth daily. 01/13/20   Randall Hiss, MD  naloxone [facility]) 2 MG/2ML injection For suspected opioid overdose, spray 1 mL in each nostril.  Repeat after 3 minutes if no or minimal response. Patient not taking: Reported on 01/13/2020 12/24/19   [provider]  omeprazole (PRILOSEC) 20 MG capsule Take 1 capsule (20 mg total) by mouth daily. Patient not taking: Reported on 02/14/2019 06/24/18 02/14/19  Roxy Horseman, PA-C    Allergies    Patient has no known allergies.  Review of Systems   Review of Systems Ten systems are reviewed and are negative for acute change except as noted in the HPI  Physical Exam Updated Vital Signs BP 116/84   Pulse 100   Temp 98.4 F (36.9 C) (Oral)   Resp 18   Ht 6\' 1"  (1.854 m)   Wt 77.1 kg   SpO2 100%   BMI 22.43 kg/m   Physical Exam Constitutional:      General: He is not in acute distress.    Appearance:  Normal appearance. He is well-developed. He is not ill-appearing or diaphoretic.  HENT:     Head: Normocephalic and atraumatic.  Eyes:     General: Vision grossly intact. Gaze aligned appropriately.     Pupils: Pupils are equal, round, and reactive to light.  Neck:     Trachea: Trachea and phonation normal.  Pulmonary:     Effort: Pulmonary effort is normal. No respiratory distress.  Abdominal:     General: There is no distension.     Palpations: Abdomen is soft.     Tenderness: There is no abdominal tenderness. There is no guarding or rebound.  Musculoskeletal:        General: Normal range of motion.     Cervical back:  Normal range of motion.  Skin:    General: Skin is warm and dry.     Comments: Multiple slightly erythematous macules present to the palm of the right hand.  Several other smaller macules present up to the forearm with a few small papules and excoriations.  No streaking fluctuance or induration.  No pain with joint movement.  Neurological:     Mental Status: He is alert.     GCS: GCS eye subscore is 4. GCS verbal subscore is 5. GCS motor subscore is 6.     Comments: Speech is clear and goal oriented, follows commands Major Cranial nerves without deficit, no facial droop Moves extremities without ataxia, coordination intact  Psychiatric:        Behavior: Behavior normal.         ED Results / Procedures / Treatments   Labs (all labs ordered are listed, but only abnormal results are displayed) Labs Reviewed  RPR    EKG None  Radiology No results found.  Procedures Procedures (including critical care time)  Medications Ordered in ED Medications  penicillin g procaine-penicillin g benzathine (BICILLIN-CR) injection 600000-600000 units (has no administration in time range)    ED Course  I have reviewed the triage vital signs and the nursing notes.  Pertinent labs & imaging results that were available during my care of the patient were reviewed by me and considered in my medical decision making (see chart for details).  Clinical Course as of 07/30/20 1845  Thu Jul 30, 2020  1804 Dr. Merceda Elks; 2.2mil unit bicillin; RPR; outpatient f/u [BM]    Clinical Course User Index [BM] Elizabeth Palau   MDM Rules/Calculators/A&P                         Additional history obtained from: 1. Nursing notes from this visit. 2. Review of electronic medical records.  Reviewed patient's infectious disease office visit from May 12, 2020.  At that time he had positive RPR and RPR titer.  Low CD4 count.  Reactive HIV and hepatitis panels. ------- 35 year old male with  multiple comorbidities as above presenting for rash of his right arm.  On exam he has a rash to the palm of his right hand which is macular in appearance concerning for syphilis infection.  He also has a few small papular excoriations of his forearm which would look more consistent with a possible mild dermatitis.  No arthralgias or joint swelling on exam.  Given patient's immunocompromised status and recent positive RPR tests and concerning exam I placed consult to infectious disease.  I spoke with Dr. Luciana Axe at 6:04 PM.  Dr. Kingsley Spittle reviewed patient's history as well as picture above and agrees with  concern for possible syphilis.  Dr. Luciana Axe recommends 2,400,000 units IM Bicillin and to send an RPR test.  Advises they will see patient in their clinic as outpatient.  No other recommendations. - RPR collected, ordered with IM penicillin as per infectious disease recommendations.  I encourage patient to call ID office tomorrow morning to schedule follow-up appointment.  I encouraged patient to maintain compliance with all his medications.  I advised patient good skin care and OTC antibiotic ointment for small excoriations.  No indication for further work-up or treatment at this time.  Patient's rash is not consistent with angioedema, anaphylaxis, abscess, bullous impetigo, herpes, SJS, TN, tickborne illness or other life-threatening conditions.  No indication for steroids antihistamines or further work-up at this time  Patient's case discussed with Dr. Donnald Garre who agrees with plan to discharge with follow-up.  ============================= Unfortunately patient eloped prior to receiving his shot of penicillin.  Patient had been sleeping comfortably in his hallway chair when suddenly he got up and walked straight out of the door, multiple nurses attempted to ask him to stay for treatment and he refused.  Patient was fully alert and oriented during this visit and did not appear to be a danger to himself or others  additionally did not appear intoxicated.  There is no indication for involuntary commitment at this time.  Patient has eloped today.  Note: Portions of this report may have been transcribed using voice recognition software. Every effort was made to ensure accuracy; however, inadvertent computerized transcription errors may still be present. Final Clinical Impression(s) / ED Diagnoses Final diagnoses:  Rash  Syphilis    Rx / DC Orders ED Discharge Orders    None       Elizabeth Palau 07/30/20 1846    Arby Barrette, MD 08/02/20 [phone]

## 2020-07-30 NOTE — ED Triage Notes (Signed)
Patient complains of a rash that appeared on his right hand 4 to 5 days ago and has progressed up up right forearm. Reports rashes are numb, tingling, and burning at 1/10 intensity. No other complaints.

## 2020-07-30 NOTE — ED Notes (Signed)
Pt left AMA. Attempted to educate pt on plan of care, but he was walking out the exit and stated, "I'm good."

## 2020-07-30 NOTE — Discharge Instructions (Signed)
At this time there does not appear to be the presence of an emergent medical condition, however there is always the potential for conditions to change. Please read and follow the below instructions.  Please return to the Emergency Department immediately for any new or worsening symptoms. Please be sure to follow up with your Primary Care Provider within one week regarding your visit today; please call their office to schedule an appointment even if you are feeling better for a follow-up visit. Please call the infectious disease specialist office tomorrow morning to schedule follow-up appointment for reevaluation.  You were treated with penicillin today for possible syphilis.  You will need close follow-up with infectious disease for improvement.  Additionally the small scratches on your arm should be washed every day and covered with sterile bandages.  You may use a small amount of antibiotic ointment on them to avoid infection.  Go to the nearest Emergency Department immediately if: You have fever or chills You have severe chest pain. You have trouble walking or coordinating movements. You are confused. You lose vision or hearing. You have numbness in your arms or legs. You have a seizure. You faint. You start to feel mixed up (confused). You have a very bad headache or a stiff neck. You have very bad joint pains or stiffness. You have jerky movements that you cannot control (seizure). Your rash covers all or most of your body. The rash may or may not be painful. You have blisters that: Are on top of the rash. Grow larger. Grow together. Are painful. Are inside your nose or mouth. You have a rash that: Looks like purple pinprick-sized spots all over your body. Has a "bull's eye" or looks like a target. Is red and painful, causes your skin to peel, and is not from being in the sun too long. You have a severe headache that does not go away with medicine.   Please read the additional  information packets attached to your discharge summary.  Do not take your medicine if  develop an itchy rash, swelling in your mouth or lips, or difficulty breathing; call 911 and seek immediate emergency medical attention if this occurs.  You may review your lab tests and imaging results in their entirety on your MyChart account.  Please discuss all results of fully with your primary care provider and other specialist at your follow-up visit.  Note: Portions of this text may have been transcribed using voice recognition software. Every effort was made to ensure accuracy; however, inadvertent computerized transcription errors may still be present.

## 2020-07-31 LAB — RPR
RPR Ser Ql: REACTIVE — AB
RPR Titer: 1:2 {titer}

## 2020-08-04 LAB — T.PALLIDUM AB, TOTAL: T Pallidum Abs: REACTIVE — AB

## 2020-08-12 ENCOUNTER — Emergency Department (HOSPITAL_COMMUNITY)
Admission: EM | Admit: 2020-08-12 | Discharge: 2020-08-12 | Disposition: A | Discharge status: Home / Self Care | Payer: Self-pay | Attending: Emergency Medicine | Admitting: Emergency Medicine

## 2020-08-12 ENCOUNTER — Encounter (HOSPITAL_COMMUNITY): Payer: Self-pay | Admitting: Emergency Medicine

## 2020-08-12 ENCOUNTER — Emergency Department (HOSPITAL_COMMUNITY): Payer: Self-pay

## 2020-08-12 DIAGNOSIS — S022XXA Fracture of nasal bones, initial encounter for closed fracture: Secondary | ICD-10-CM | POA: Insufficient documentation

## 2020-08-12 DIAGNOSIS — Z21 Asymptomatic human immunodeficiency virus [HIV] infection status: Secondary | ICD-10-CM | POA: Insufficient documentation

## 2020-08-12 DIAGNOSIS — R519 Headache, unspecified: Secondary | ICD-10-CM | POA: Insufficient documentation

## 2020-08-12 DIAGNOSIS — F17210 Nicotine dependence, cigarettes, uncomplicated: Secondary | ICD-10-CM | POA: Insufficient documentation

## 2020-08-12 MED ORDER — CEPHALEXIN 500 MG PO CAPS: 500.0000 mg | ORAL_CAPSULE | Freq: Three times a day (TID) | ORAL | 0 refills | Status: AC

## 2020-08-12 MED ORDER — IBUPROFEN 400 MG PO TABS
600.0000 mg | ORAL_TABLET | Freq: Once | ORAL | Status: AC
  Administered 2020-08-12: 600 mg via ORAL
  Filled 2020-08-12: qty 1

## 2020-08-12 MED ORDER — ACETAMINOPHEN 325 MG PO TABS
650.0000 mg | ORAL_TABLET | Freq: Once | ORAL | Status: AC
  Administered 2020-08-12: 650 mg via ORAL
  Filled 2020-08-12: qty 2
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
## 2020-08-12 NOTE — Discharge Instructions (Signed)
Call your primary care doctor or specialist as discussed in the next 2-3 days.   Return immediately back to the ER if:  Your symptoms worsen within the next 12-24 hours. You develop new symptoms such as new fevers, persistent vomiting, new pain, shortness of breath, or new weakness or numbness, or if you have any other concerns.  

## 2020-08-12 NOTE — ED Notes (Signed)
Discharge instructions discussed with pt. Pt verbalized understanding. Pt stable and ambulatory. No signature pad available. 

## 2020-09-03 ENCOUNTER — Ambulatory Visit: Payer: Self-pay

## 2020-09-09 ENCOUNTER — Ambulatory Visit: Payer: Self-pay | Admitting: Internal Medicine

## 2020-09-13 ENCOUNTER — Emergency Department (HOSPITAL_COMMUNITY)
Admission: EM | Admit: 2020-09-13 | Discharge: 2020-09-13 | Disposition: A | Discharge status: Home / Self Care | Payer: Self-pay | Attending: Emergency Medicine | Admitting: Emergency Medicine

## 2020-09-13 ENCOUNTER — Encounter (HOSPITAL_COMMUNITY): Payer: Self-pay | Admitting: Emergency Medicine

## 2020-09-13 ENCOUNTER — Other Ambulatory Visit: Payer: Self-pay

## 2020-09-13 DIAGNOSIS — B20 Human immunodeficiency virus [HIV] disease: Secondary | ICD-10-CM | POA: Insufficient documentation

## 2020-09-13 DIAGNOSIS — J342 Deviated nasal septum: Secondary | ICD-10-CM | POA: Insufficient documentation

## 2020-09-13 DIAGNOSIS — F17210 Nicotine dependence, cigarettes, uncomplicated: Secondary | ICD-10-CM | POA: Insufficient documentation

## 2020-09-13 DIAGNOSIS — Z79899 Other long term (current) drug therapy: Secondary | ICD-10-CM | POA: Insufficient documentation

## 2020-09-13 DIAGNOSIS — B182 Chronic viral hepatitis C: Secondary | ICD-10-CM | POA: Insufficient documentation

## 2020-09-13 NOTE — ED Notes (Signed)
Pt in waiting room
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
## 2020-09-13 NOTE — ED Notes (Addendum)
Pt didn't answer X5

## 2020-09-13 NOTE — ED Notes (Signed)
No answer for triage.

## 2020-09-20 ENCOUNTER — Emergency Department (HOSPITAL_COMMUNITY)
Admission: EM | Admit: 2020-09-20 | Discharge: 2020-09-20 | Disposition: A | Discharge status: Home / Self Care | Payer: Self-pay | Attending: Emergency Medicine | Admitting: Emergency Medicine

## 2020-09-20 ENCOUNTER — Other Ambulatory Visit: Payer: Self-pay

## 2020-09-20 DIAGNOSIS — Z5321 Procedure and treatment not carried out due to patient leaving prior to being seen by health care provider: Secondary | ICD-10-CM | POA: Insufficient documentation

## 2020-09-20 DIAGNOSIS — M25571 Pain in right ankle and joints of right foot: Secondary | ICD-10-CM | POA: Insufficient documentation

## 2020-09-20 NOTE — ED Notes (Signed)
Pt states he wants to leave and come back later.

## 2020-09-20 NOTE — ED Triage Notes (Signed)
Pt reports R ankle pain. Denies injury. Worse with walking.

## 2020-10-15 ENCOUNTER — Other Ambulatory Visit: Payer: Self-pay

## 2020-10-15 ENCOUNTER — Emergency Department (HOSPITAL_COMMUNITY): Payer: Self-pay

## 2020-10-15 ENCOUNTER — Encounter (HOSPITAL_COMMUNITY): Payer: Self-pay | Admitting: Pharmacy Technician

## 2020-10-15 ENCOUNTER — Inpatient Hospital Stay (HOSPITAL_COMMUNITY)
Admission: EM | Admit: 2020-10-15 | Discharge: 2020-10-17 | DRG: 141 | Discharge status: Against Medical Advice | Payer: Self-pay | Attending: Family Medicine | Admitting: Family Medicine

## 2020-10-15 DIAGNOSIS — W19XXXA Unspecified fall, initial encounter: Secondary | ICD-10-CM | POA: Diagnosis present

## 2020-10-15 DIAGNOSIS — S02600A Fracture of unspecified part of body of mandible, initial encounter for closed fracture: Secondary | ICD-10-CM

## 2020-10-15 DIAGNOSIS — Z5329 Procedure and treatment not carried out because of patient's decision for other reasons: Secondary | ICD-10-CM | POA: Diagnosis present

## 2020-10-15 DIAGNOSIS — E861 Hypovolemia: Secondary | ICD-10-CM | POA: Diagnosis present

## 2020-10-15 DIAGNOSIS — Z79899 Other long term (current) drug therapy: Secondary | ICD-10-CM

## 2020-10-15 DIAGNOSIS — E860 Dehydration: Secondary | ICD-10-CM | POA: Diagnosis present

## 2020-10-15 DIAGNOSIS — E876 Hypokalemia: Secondary | ICD-10-CM | POA: Diagnosis not present

## 2020-10-15 DIAGNOSIS — R55 Syncope and collapse: Secondary | ICD-10-CM

## 2020-10-15 DIAGNOSIS — Z833 Family history of diabetes mellitus: Secondary | ICD-10-CM

## 2020-10-15 DIAGNOSIS — I071 Rheumatic tricuspid insufficiency: Secondary | ICD-10-CM | POA: Diagnosis present

## 2020-10-15 DIAGNOSIS — B20 Human immunodeficiency virus [HIV] disease: Secondary | ICD-10-CM | POA: Diagnosis present

## 2020-10-15 DIAGNOSIS — Z809 Family history of malignant neoplasm, unspecified: Secondary | ICD-10-CM

## 2020-10-15 DIAGNOSIS — Y92480 Sidewalk as the place of occurrence of the external cause: Secondary | ICD-10-CM

## 2020-10-15 DIAGNOSIS — S0181XA Laceration without foreign body of other part of head, initial encounter: Secondary | ICD-10-CM | POA: Diagnosis present

## 2020-10-15 DIAGNOSIS — Z23 Encounter for immunization: Secondary | ICD-10-CM

## 2020-10-15 DIAGNOSIS — W1830XA Fall on same level, unspecified, initial encounter: Secondary | ICD-10-CM | POA: Diagnosis present

## 2020-10-15 DIAGNOSIS — Z8249 Family history of ischemic heart disease and other diseases of the circulatory system: Secondary | ICD-10-CM

## 2020-10-15 DIAGNOSIS — F1520 Other stimulant dependence, uncomplicated: Secondary | ICD-10-CM | POA: Diagnosis present

## 2020-10-15 DIAGNOSIS — B182 Chronic viral hepatitis C: Secondary | ICD-10-CM | POA: Diagnosis present

## 2020-10-15 DIAGNOSIS — Z20822 Contact with and (suspected) exposure to covid-19: Secondary | ICD-10-CM | POA: Diagnosis present

## 2020-10-15 DIAGNOSIS — F17210 Nicotine dependence, cigarettes, uncomplicated: Secondary | ICD-10-CM | POA: Diagnosis present

## 2020-10-15 DIAGNOSIS — S02611A Fracture of condylar process of right mandible, initial encounter for closed fracture: Principal | ICD-10-CM | POA: Diagnosis present

## 2020-10-15 DIAGNOSIS — I4510 Unspecified right bundle-branch block: Secondary | ICD-10-CM | POA: Diagnosis present

## 2020-10-15 LAB — CBC
HCT: 43 % (ref 39.0–52.0)
Hemoglobin: 14.6 g/dL (ref 13.0–17.0)
MCH: 31.6 pg (ref 26.0–34.0)
MCHC: 34 g/dL (ref 30.0–36.0)
MCV: 93.1 fL (ref 80.0–100.0)
Platelets: 275 10*3/uL (ref 150–400)
RBC: 4.62 MIL/uL (ref 4.22–5.81)
RDW: 13.2 % (ref 11.5–15.5)
WBC: 8 10*3/uL (ref 4.0–10.5)
nRBC: 0 % (ref 0.0–0.2)

## 2020-10-15 LAB — CBG MONITORING, ED: Glucose-Capillary: 89 mg/dL (ref 70–99)

## 2020-10-15 LAB — BASIC METABOLIC PANEL
Anion gap: 6 (ref 5–15)
BUN: 12 mg/dL (ref 6–20)
CO2: 29 mmol/L (ref 22–32)
Calcium: 9.1 mg/dL (ref 8.9–10.3)
Chloride: 101 mmol/L (ref 98–111)
Creatinine, Ser: 0.94 mg/dL (ref 0.61–1.24)
GFR, Estimated: >60 mL/min (ref >60)
Glucose, Bld: 108 mg/dL — ABNORMAL HIGH (ref 70–99)
Potassium: 3.6 mmol/L (ref 3.5–5.1)
Sodium: 136 mmol/L (ref 135–145)

## 2020-10-15 LAB — RESP PANEL BY RT-PCR (FLU A&B, COVID) ARPGX2
Influenza A by PCR: NEGATIVE
Influenza B by PCR: NEGATIVE
SARS Coronavirus 2 by RT PCR: NEGATIVE

## 2020-10-15 LAB — ETHANOL: Alcohol, Ethyl (B): <10 mg/dL (ref <10)

## 2020-10-15 MED ORDER — LACTATED RINGERS IV SOLN: INTRAVENOUS | Status: DC

## 2020-10-15 MED ORDER — ACETAMINOPHEN 650 MG RE SUPP: 650.0000 mg | Freq: Four times a day (QID) | RECTAL | Status: DC | PRN

## 2020-10-15 MED ORDER — DOLUTEGRAVIR-LAMIVUDINE 50-300 MG PO TABS: 1.0000 | ORAL_TABLET | Freq: Every day | ORAL | Status: DC

## 2020-10-15 MED ORDER — KETOROLAC TROMETHAMINE 15 MG/ML IJ SOLN
15.0000 mg | Freq: Once | INTRAMUSCULAR | Status: AC
  Administered 2020-10-15: 15 mg via INTRAVENOUS
  Filled 2020-10-15: qty 1

## 2020-10-15 MED ORDER — TETANUS-DIPHTH-ACELL PERTUSSIS 5-2.5-18.5 LF-MCG/0.5 IM SUSY
0.5000 mL | PREFILLED_SYRINGE | Freq: Once | INTRAMUSCULAR | Status: AC
  Administered 2020-10-15: 0.5 mL via INTRAMUSCULAR
  Filled 2020-10-15: qty 0.5

## 2020-10-15 MED ORDER — LAMIVUDINE 150 MG PO TABS
300.0000 mg | ORAL_TABLET | Freq: Every day | ORAL | Status: DC
Start: 2020-10-15 — End: 2020-10-17
  Administered 2020-10-15 – 2020-10-16 (×2): 300 mg via ORAL
  Filled 2020-10-15 (×4): qty 2

## 2020-10-15 MED ORDER — LACTATED RINGERS IV BOLUS
1000.0000 mL | Freq: Once | INTRAVENOUS | Status: AC
  Administered 2020-10-15: 1000 mL via INTRAVENOUS

## 2020-10-15 MED ORDER — NICOTINE 21 MG/24HR TD PT24
21.0000 mg | MEDICATED_PATCH | Freq: Every day | TRANSDERMAL | Status: DC
  Administered 2020-10-15 – 2020-10-16 (×2): 21 mg via TRANSDERMAL
  Filled 2020-10-15 (×2): qty 1

## 2020-10-15 MED ORDER — ACETAMINOPHEN 500 MG PO TABS
1000.0000 mg | ORAL_TABLET | Freq: Four times a day (QID) | ORAL | Status: DC | PRN
  Administered 2020-10-16 – 2020-10-17 (×2): 1000 mg via ORAL
  Filled 2020-10-15 (×3): qty 2

## 2020-10-15 MED ORDER — ACETAMINOPHEN 325 MG PO TABS: 650.0000 mg | ORAL_TABLET | Freq: Four times a day (QID) | ORAL | Status: DC | PRN

## 2020-10-15 MED ORDER — DOLUTEGRAVIR SODIUM 50 MG PO TABS
50.0000 mg | ORAL_TABLET | Freq: Every day | ORAL | Status: DC
  Administered 2020-10-15 – 2020-10-16 (×2): 50 mg via ORAL
  Filled 2020-10-15 (×3): qty 1

## 2020-10-15 MED ORDER — HEPARIN SODIUM (PORCINE) 5000 UNIT/ML IJ SOLN
5000.0000 [IU] | Freq: Three times a day (TID) | INTRAMUSCULAR | Status: AC
  Filled 2020-10-15 (×2): qty 1
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
## 2020-10-15 NOTE — Consult Note (Signed)
Facial Trauma Consult Note    Name: [name] MRN: [id number]  Date:  10/15/2020            DOB: 11-14-1985  Reason for Consult: Mandible Fracture  History of Present Illness: Patient reports that he was walking to the bus stop this morning when he passed out. He reports using crystal meth overnight which he does routinely daily for the past 5 years. He denies eating much or drinking water overnight or this morning, He can't remember passing out, but when he woke up he reports soreness in his jaw and chin. Per previous notes he was running to the bus stop prior to his collapse. He reports that although his dentition is poorly maintained his occlusion is unchanged. He denies numbness to this face and reports that he is sore in his right TMJ area. He otherwise denies constitutional symptoms.    Patient Active Problem List   Diagnosis Date Noted  . Chronic hepatitis C without hepatic coma (HCC) 05/12/2020  . Methamphetamine intoxication (HCC) 12/20/2019  . AKI (acute kidney injury) (HCC) 12/20/2019  . Rhabdomyolysis 12/20/2019  . Elevated transaminase level 12/20/2019  . Elevated CK   . Methamphetamine use disorder, severe, dependence (HCC) 07/10/2019  . 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 HIV (human immunodeficiency virus infection) (HCC) 06/05/2019    Past Medical History:     Past Medical History:  Diagnosis Date  . ADHD (attention deficit hyperactivity disorder)   . Chronic hepatitis C without hepatic coma (HCC) 05/12/2020  . Drug abuse (HCC)    meth, marijuana  . HIV positive (HCC)     Past Surgical History: History reviewed. No pertinent surgical history.  Social History: Social History        Tobacco Use  . Smoking status: Current Every Day Smoker    Packs/day: 1.00    Years: 10.00    Pack years: 10.00    Types: Cigarettes  . Smokeless  tobacco: Never Used  Vaping Use  . Vaping Use: Never used  Substance Use Topics  . Alcohol use: Not Currently  . Drug use: Yes    Types: Methamphetamines    Comment: last used yesterday    Please also refer to relevant sections of EMR.  Family History:      Family History  Problem Relation Age of Onset  . Diabetes Mother   . Hypertension Mother   . Cancer Mother   . Cancer Father   . Diabetes Father   . Hypertension Father    Allergies and Medications: No Known Allergies No current facility-administered medications on file prior to encounter.         Current Outpatient Medications on File Prior to Encounter  Medication Sig Dispense Refill  . acetaminophen (TYLENOL) 500 MG tablet Take 500-1,000 mg by mouth every 6 (six) hours as needed for mild pain.    . Dolutegravir-lamiVUDine (DOVATO) 50-300 MG TABS Take 1 tablet by mouth daily. 30 tablet 6  . ibuprofen (ADVIL) 200 MG tablet Take 600 mg by mouth every 6 (six) hours as needed for mild pain.    . naloxone [facility]) 2 MG/2ML injection For suspected opioid overdose, spray 1 mL in each nostril.  Repeat after 3 minutes if no or minimal response.    . [DISCONTINUED] omeprazole (PRILOSEC) 20 MG capsule Take 1 capsule (20 mg total) by mouth daily. (Patient not taking: Reported on 02/14/2019) 30 capsule 0  History of Present Illness:    BP 129/82   Pulse 82   Temp 98.5 F (36.9 C) (Oral)   Resp 17   SpO2 100%   Physical Exam  HEENT: No bony step offs palpable. Tenderness to palpation of the right pre-auricular region. CN II-XII grossly in tact. Limited MIO secondary to pain (<57mm). Intraorally, dentition is poorly maintained. He appears to occlude end on end that is further supported by severe attrition of his teeth.  CV: RRR Pulm: NWB Abd: Non-distended Extremities: [facility]  Maxillofacial CT Scan: - Right subcondylar fracture  Assessment/Plan:     Mr. Bogan is a 35 year old male with a complicated  past medical history pertinent for HIV and Hep C who presents s/p fall with right subcondylar fracture. He will require ORIF of the right subcondylar fracture under general anesthesia. The risk/benefits and alternatives were discussed with the patient who has elected to proceed with treatment.   Facial Trauma Recommendations:  - Patient posted for the OR for 10/16/20, tentative start time 3:30 pm - Please make patient NPO 8 hours prior to surgery (sips for meds ok) - advised patient that he can eat breakfast before 7am - Patient should remain on a soft diet before and after surgery for 6 weeks - Consent will be obtained in the pre-op area prior to surgery - Patient's mouth will not be wired closed post-operatively and he will be cleared for discharge from a mandibular fracture perspective immediately post-operatively.   Post-op Instructions:  - Patient should maintain a soft, non-chew diet for 6 weeks after surgery  - He should refrain from activities that may put his jaw at risk of injury  - Sutures placed intraoperatively will dissolve and their own within 2 weeks  - Please provide 7 day course of Clindamycin post-operatively  - Defer pain management to primary team, but most Mandibular ORIF require Less than 5 days of nacotics   Follow up: Patient should follow up in 1-2 weeks at our office below. Please provide contact information to patient  Dr. Valda Favia  [facility]  [phone]  [address] [place], [place] [address]

## 2020-10-15 NOTE — H&P (Addendum)
Family Medicine Teaching [facility] Admission History and Physical Service Pager: [phone]  Patient name: [name] Medical record number: [id number] Date of birth: 02-May-1986 Age: 35 y.o. Gender: male  Primary Care Provider: Patient, No Pcp Per (Inactive) Consultants: ENT oral Surgery  Code Status: Full Preferred Emergency Contact: [name], mother, [phone]   Chief Complaint: Syncope, facial pain   Assessment and Plan: [name] is a 35 y.o. male presenting with facial pain s/p fall via syncopal episode, found to have an acute mandible fracture. PMH is significant for HIV (2021 diagnosis), Chronic Hep C, polysubstance abuse with methamphetamine/heroine, recent nasal fracture in 07/2020.    Fall s/p syncopal event: Acute.  Patient presented after fall with dizziness and LOC after staying awake the entire night prior using meth; subsequently sustained mandibular fracture as discussed below. In the ED, patient received Toradol x1, CBC and BMP wnl, with EKG abnormalities noted on prior EKG. Sounds largely vasovagal with preceding dizziness/tunnel vision, especially in the setting of his recent drug use and lack of sleep or sustenance with notable full resolution without residual symptoms. Do not feel that a full syncope work-up would be of benefit at this time, but will proceed with ordering an echocardiogram due to abnormal EKG with signs of LVH and incomplete RBBB, as well as biatrial enlargement (similar to prior EKGs in 12/2019). However doubt that underlying structural heart abnormalities contributed, but could be considered. Less likely that current presentation is secondary to an arrhythmia, but patient will remain on cardiac telemetry for monitoring. Low suspicion for thyroid disease or seizure and will not pursue further testing at this time.  - Admit to FPTS, med-tele, attending Dr. Leveda Anna - Continuous cardiac telemetry - Vitals per routine - Echocardiogram  - Repeat EKG in AM - AM  labs: CBC, BMP - LR @ 160mL/hr - TOC consulted for PCP and medication assistance   R Comminuted and displaced Mandibular fracture s/p fall as above: Acute.  Present within subcondylar region on CT.  Oral surgery already evaluated, plans on ORIF tomorrow, 4/1. - ENT trauma surgery consulted, appreciate recommendations - NPO at 7am - Tylenol 1000mg  q6h PRN for pain, consider a second toradol if worsening pain  - Soft diet prior to and post surgery  HIV Last seen by ID on 05/12/2020 with HIV RNA quant 371, CD4 569. Currently being treated with Dovato, last ID note stated low threshold to switch to Symtuza if non-adherent with Dovato (pt endorses compliance).  - CD4/CD8 quant - HIV RNA quant - Dovato not on formulary, giving individual components of dolutegravir and lamivudine  Chronic hepatitis C Patient currently being see by ID. Not being treated at this time for hep c until his HIV is under control per ID note on 05/12/2020. HCV RNA quant on 05/12/2020 was 1,670,000. - HCV RNA quant  Hx of positive RPR  Syphilis Patient recently seen on 07/30/20 by ED with notable rash concerning for syphilis; RPR was reactive with 1:2 titer and positive T pallidum. Patient left before being treated with PCN. Patient previously treated in 2020 with PCN (at that time his titer was 1:128, most recently 1:2), unsure given patient's HIV status if this is treatment resistance vs reoccurrence. Patient did not disclose this information during interview, concern for unsafe sexual practice and will screen for GC/chlamydia as well (urine Gc/Ch neg on 05/12/2020).  - RPR  - Obtain urine GC/Ch - Consider treatment with PCN while admitted - Counseling on safe sex and partner treatment - Discuss with  ID in am   Methamphetamine use Patient reports daily crystal meth use of about 1-1.5g. Has never gone through withdrawal as far as he is aware but has stopped using for 3 days recently and noted fatigue/"sleepiness" and  leg swelling at that time. Patient is not interested in cessation at this time.  - CIWAs - Monitor for signs of withdrawal and consider PRN ativan if agitated - UDS - TOC consulted for substance abuse  Tobacco use:  Patient reports smoking 1PPD, would like to have a nicotine patch. In the ED, had to step outside to smoke. - Nicotine patch 21mg  - TOC consulted for substance abuse  FEN/GI: Soft diet now, NPO @ 7am; LR @100mL /hr Prophylaxis: Heparin, will d/c in AM prior to surgery  Disposition: Med-tele, anticipate dc tomorrow or 4/2.   History of Present Illness:  [name] is a 35 y.o. male with a history of HIV, hepatitis C, and drug use presenting with facial pain after fall with possible syncope earlier today, found to have an acute mandible fracture.   Patient reports that 3/30, he was with friends and using methamphetamine, which he states he uses daily about 1-1.5 g.  He reports that afterwards he did not get any sleep and did not eat or drink anything and then this morning went to go to the bus stop and was becoming dizzy intermittently while trying to catch the bus was having to take breaks.  He reports that the bus was about to leave so he tried to run to catch it and had worsening of dizziness and "blacked out" falling forward, and the next thing he knew, he was waking up on the concrete. Patient reported loss of consciousness for unknown amount of time (ED note reports about 10-15 minutes, though patient seemed unsure of duration during exam). Once he awoke he felt initially confused but quickly returned to baseline under 1 minute.  Patient denied any lip biting, urinary or bowel incontinence with this episode.  Denied preceding chest pain, palpitations, recent illness, fever, leg swelling, cough.  He reports that he has never had an episode similar to this in the past.  Patient reports that he is not interested in methamphetamine cessation or tobacco cessation at this time.  Does  report that he recently did not use for 3 days and had increasing of sleepiness and leg swelling during that time.  Current tobacco use of 1 PPD.  Denies alcohol or other illicit drug use (previous use of heroin, but denies this now). He is "crashing at friend's house" right now.   He also reports a known history of HIV and hepatitis C.  He would like these followed up on today.  He has had difficulty following up with ID.  Reports compliance to his HIV medication. Denies difficulty affording his HIV medication right now, but would be interested in discussing resources with social work for appointments/meds etc.   ED Course: On arrival, he was afebrile and hemodynamically stable. Noted to have a facial laceration with facial pain, thus obtained CT showing an acute comminuted and displaced subcondylar fracture of his right mandible. BMP, CBC overall unremarkable. EKG without ischemic changes, does show early repolarization, incomplete RBBB, and possible LVH. Oral surgery was consulted and plans to perform ORIF tomorrow, 4/1.   Review Of Systems: Per HPI with the following additions:   Review of Systems  Constitutional: Negative for fatigue and fever.  HENT: Negative for congestion, rhinorrhea and sinus pressure.   Eyes: Negative for  visual disturbance.  Respiratory: Positive for shortness of breath. Negative for cough and chest tightness.      Patient Active Problem List   Diagnosis Date Noted  . Fall 10/15/2020  . Chronic hepatitis C without hepatic coma (HCC) 05/12/2020  . Methamphetamine intoxication (HCC) 12/20/2019  . AKI (acute kidney injury) (HCC) 12/20/2019  . Rhabdomyolysis 12/20/2019  . Elevated transaminase level 12/20/2019  . Elevated CK   . Methamphetamine use disorder, severe, dependence (HCC) 07/10/2019  . 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 HIV (human  immunodeficiency virus infection) (HCC) 06/05/2019    Past Medical History: Past Medical History:  Diagnosis Date  . ADHD (attention deficit hyperactivity disorder)   . Chronic hepatitis C without hepatic coma (HCC) 05/12/2020  . Drug abuse (HCC)    meth, marijuana  . HIV positive (HCC)     Past Surgical History: History reviewed. No pertinent surgical history.  Social History: Social History   Tobacco Use  . Smoking status: Current Every Day Smoker    Packs/day: 1.00    Years: 10.00    Pack years: 10.00    Types: Cigarettes  . Smokeless tobacco: Never Used  Vaping Use  . Vaping Use: Never used  Substance Use Topics  . Alcohol use: Not Currently  . Drug use: Yes    Types: Methamphetamines    Comment: last used yesterday   Additional social history: current daily meth and tobacco user, denies alcohol and other drugs  Please also refer to relevant sections of EMR.  Family History: Family History  Problem Relation Age of Onset  . Diabetes Mother   . Hypertension Mother   . Cancer Mother   . Cancer Father   . Diabetes Father   . Hypertension Father     Allergies and Medications: No Known Allergies No current facility-administered medications on file prior to encounter.   Current Outpatient Medications on File Prior to Encounter  Medication Sig Dispense Refill  . acetaminophen (TYLENOL) 500 MG tablet Take 500-1,000 mg by mouth every 6 (six) hours as needed for mild pain.    . Dolutegravir-lamiVUDine (DOVATO) 50-300 MG TABS Take 1 tablet by mouth daily. 30 tablet 6  . ibuprofen (ADVIL) 200 MG tablet Take 600 mg by mouth every 6 (six) hours as needed for mild pain.    . naloxone [facility](NARCAN) 2 MG/2ML injection For suspected opioid overdose, spray 1 mL in each nostril.  Repeat after 3 minutes if no or minimal response.    . [DISCONTINUED] omeprazole (PRILOSEC) 20 MG capsule Take 1 capsule (20 mg total) by mouth daily. (Patient not taking: Reported on 02/14/2019) 30  capsule 0    Objective: BP 135/81 (BP Location: Left Arm)   Pulse 75   Temp 98.5 F (36.9 C) (Oral)   Resp 14   SpO2 100%  Exam: General -- Alert and oriented x3, pleasant and cooperative. HEENT -- Head is normocephalic. PERRLA. EOMI. Ears, nose and throat were benign.  Poor dentition.  Tenderness to palpation of right superior mandible. Neck -- supple; no bruits. Integument --superficial abrasions noted on several b/l dorsal fingers.  Additionally superficial abrasion on left forehead and few centimeter horizontal laceration underneath the chin without active bleeding. Chest -- good expansion. Lungs clear to auscultation. Comfortable on room air, no increased WOB Cardiac -- RRR. No murmurs noted.  Abdomen -- soft, nontender. No masses palpable. Bowel sounds present. CNS -- cranial nerves II through  XII grossly intact.  Gait normal. Extremities-tenderness on BLE secondary to abrasions, no edema in extremities. 5/5 bilateral strength. Tibialis pulses 2+ bilaterally  Psych-good eye contact.  Answers questions appropriately.   Labs and Imaging: CBC BMET  Recent Labs  Lab 10/15/20 1400  WBC 8.0  HGB 14.6  HCT 43.0  PLT 275   Recent Labs  Lab 10/15/20 1400  NA 136  K 3.6  CL 101  CO2 29  BUN 12  CREATININE 0.94  GLUCOSE 108*  CALCIUM 9.1     EKG: Sinus rhythm, possible biatrial enlargement, incomplete RBB, LVH, (largely unchanged from prior)  CT Maxillofacial Wo Contrast  Result Date: 10/15/2020 CLINICAL DATA:  Facial trauma. Additional history provided: Fall, left periorbital laceration, left upper jaw pain. EXAM: CT MAXILLOFACIAL WITHOUT CONTRAST TECHNIQUE: Multidetector CT imaging of the maxillofacial structures was performed. Multiplanar CT image reconstructions were also generated. COMPARISON:  Maxillofacial CT 08/12/2020. FINDINGS: Osseous: Acute comminuted and displaced subcondylar fracture of the right mandible at the junction of the condylar neck and ramus and  with fractures extending through the sigmoid notch. No other acute maxillofacial fracture is identified. Chronic fracture deformities of the right nasal bone and of the nasal septum. Orbits: No acute finding within the orbits. The globes are normal in size and contour. The extraocular muscles and optic nerve sheath complexes are symmetric and unremarkable. Sinuses: No significant paranasal sinus disease. Soft tissues: Right perimandibular soft tissue swelling. Limited intracranial: No evidence of acute intracranial abnormality within the field of view. Other: Poor dentition with multiple absent and carious teeth and multifocal periapical lucencies. IMPRESSION: Acute comminuted and displaced subcondylar fracture of the right mandible, at the junction of the condylar neck and ramus, and with fractures extending through the sigmoid notch. Right perimandibular soft tissue swelling. Chronic fracture deformities of the right nasal bone and of the nasal septum. Electronically Signed   By: Jackey Loge DO   On: 10/15/2020 17:33     Evelena Leyden, DO 10/15/2020, 9:43 PM PGY-1, [facility] Family Medicine FPTS Intern pager: [phone], text pages welcome  FPTS Upper-Level Resident Addendum   I have independently interviewed and examined the patient. I have discussed the above with the original author and agree with their documentation. My edits for correction/addition/clarification are added. Please see also any attending notes.    Allayne Stack, DO  Family Medicine PGY-3

## 2020-10-15 NOTE — ED Notes (Signed)
Pt stating he would like to go outside to smoke. Informed him of the policy. Offered him to talk to the provider to obtain a nicotine patch. Pt refused stating he would rather just leave. Informed him of importance of his admission today. Dr. Girard Cooter informed and is at bedside

## 2020-10-15 NOTE — ED Notes (Signed)
Pt aware of need for urine sample, pt states he is unable to provide sample at this time.

## 2020-10-16 ENCOUNTER — Other Ambulatory Visit: Payer: Self-pay | Admitting: Family Medicine

## 2020-10-16 ENCOUNTER — Observation Stay (HOSPITAL_COMMUNITY): Payer: Self-pay

## 2020-10-16 ENCOUNTER — Encounter (HOSPITAL_COMMUNITY)
Admission: EM | Discharge status: Against Medical Advice | Payer: Self-pay | Source: Home / Self Care | Attending: Family Medicine

## 2020-10-16 ENCOUNTER — Observation Stay (HOSPITAL_COMMUNITY): Payer: Self-pay | Admitting: Certified Registered"

## 2020-10-16 ENCOUNTER — Encounter (HOSPITAL_COMMUNITY): Payer: Self-pay | Admitting: Family Medicine

## 2020-10-16 DIAGNOSIS — S02600A Fracture of unspecified part of body of mandible, initial encounter for closed fracture: Secondary | ICD-10-CM

## 2020-10-16 DIAGNOSIS — R55 Syncope and collapse: Secondary | ICD-10-CM

## 2020-10-16 DIAGNOSIS — I361 Nonrheumatic tricuspid (valve) insufficiency: Secondary | ICD-10-CM

## 2020-10-16 HISTORY — PX: ORIF MANDIBULAR FRACTURE: SHX2127

## 2020-10-16 LAB — RAPID URINE DRUG SCREEN, HOSP PERFORMED
Amphetamines: POSITIVE — AB
Barbiturates: NOT DETECTED
Benzodiazepines: POSITIVE — AB
Cocaine: NOT DETECTED
Opiates: NOT DETECTED
Tetrahydrocannabinol: NOT DETECTED

## 2020-10-16 LAB — BASIC METABOLIC PANEL
Anion gap: 6 (ref 5–15)
BUN: 9 mg/dL (ref 6–20)
CO2: 28 mmol/L (ref 22–32)
Calcium: 8.8 mg/dL — ABNORMAL LOW (ref 8.9–10.3)
Chloride: 103 mmol/L (ref 98–111)
Creatinine, Ser: 0.82 mg/dL (ref 0.61–1.24)
GFR, Estimated: >60 mL/min (ref >60)
Glucose, Bld: 96 mg/dL (ref 70–99)
Potassium: 3.2 mmol/L — ABNORMAL LOW (ref 3.5–5.1)
Sodium: 137 mmol/L (ref 135–145)

## 2020-10-16 LAB — CBC
HCT: 37.9 % — ABNORMAL LOW (ref 39.0–52.0)
Hemoglobin: 13.2 g/dL (ref 13.0–17.0)
MCH: 31.9 pg (ref 26.0–34.0)
MCHC: 34.8 g/dL (ref 30.0–36.0)
MCV: 91.5 fL (ref 80.0–100.0)
Platelets: 243 10*3/uL (ref 150–400)
RBC: 4.14 MIL/uL — ABNORMAL LOW (ref 4.22–5.81)
RDW: 13.3 % (ref 11.5–15.5)
WBC: 5 10*3/uL (ref 4.0–10.5)
nRBC: 0 % (ref 0.0–0.2)

## 2020-10-16 LAB — CD4/CD8 (T-HELPER/T-SUPPRESSOR CELL)
CD4 absolute: 773 /uL (ref 400–1790)
CD4%: 32 % — ABNORMAL LOW (ref 33–65)
CD8 T Cell Abs: 1143 /uL — ABNORMAL HIGH (ref 190–1000)
CD8tox: 48 % — ABNORMAL HIGH (ref 12–40)
Ratio: 0.68 — ABNORMAL LOW (ref 1.0–3.0)
Total lymphocyte count: 2384 /uL (ref 1000–4000)

## 2020-10-16 LAB — ECHOCARDIOGRAM COMPLETE
Area-P 1/2: 2.8 cm2
S' Lateral: 2.5 cm

## 2020-10-16 LAB — URINALYSIS, ROUTINE W REFLEX MICROSCOPIC
Bilirubin Urine: NEGATIVE
Glucose, UA: NEGATIVE mg/dL
Hgb urine dipstick: NEGATIVE
Ketones, ur: NEGATIVE mg/dL
Leukocytes,Ua: NEGATIVE
Nitrite: NEGATIVE
Protein, ur: NEGATIVE mg/dL
Specific Gravity, Urine: 1.024 (ref 1.005–1.030)
pH: 6 (ref 5.0–8.0)

## 2020-10-16 LAB — RPR
RPR Ser Ql: REACTIVE — AB
RPR Titer: 1:1 {titer}

## 2020-10-16 LAB — GLUCOSE, CAPILLARY: Glucose-Capillary: 129 mg/dL — ABNORMAL HIGH (ref 70–99)

## 2020-10-16 LAB — SURGICAL PCR SCREEN
MRSA, PCR: NEGATIVE
Staphylococcus aureus: NEGATIVE

## 2020-10-16 SURGERY — OPEN REDUCTION INTERNAL FIXATION (ORIF) MANDIBULAR FRACTURE
Anesthesia: General | Site: Mouth | Laterality: Right

## 2020-10-16 MED ORDER — KETOROLAC TROMETHAMINE 30 MG/ML IJ SOLN
INTRAMUSCULAR | Status: AC
  Filled 2020-10-16: qty 1

## 2020-10-16 MED ORDER — FENTANYL CITRATE (PF) 100 MCG/2ML IJ SOLN
INTRAMUSCULAR | Status: AC
  Filled 2020-10-16: qty 2

## 2020-10-16 MED ORDER — HEMOSTATIC AGENTS (NO CHARGE) OPTIME
TOPICAL | Status: DC | PRN
  Administered 2020-10-16: 1 via TOPICAL

## 2020-10-16 MED ORDER — PROPOFOL 10 MG/ML IV BOLUS
INTRAVENOUS | Status: AC
  Filled 2020-10-16: qty 40

## 2020-10-16 MED ORDER — OXYMETAZOLINE HCL 0.05 % NA SOLN
NASAL | Status: DC | PRN
Start: 2020-10-16 — End: 2020-10-16
  Administered 2020-10-16 (×2): 1 via NASAL

## 2020-10-16 MED ORDER — SUGAMMADEX SODIUM 200 MG/2ML IV SOLN
INTRAVENOUS | Status: DC | PRN
  Administered 2020-10-16: 400 mg via INTRAVENOUS

## 2020-10-16 MED ORDER — POTASSIUM CHLORIDE CRYS ER 20 MEQ PO TBCR
40.0000 meq | EXTENDED_RELEASE_TABLET | Freq: Once | ORAL | Status: AC
  Administered 2020-10-16: 40 meq via ORAL
  Filled 2020-10-16: qty 2

## 2020-10-16 MED ORDER — MIDAZOLAM HCL 2 MG/2ML IJ SOLN
INTRAMUSCULAR | Status: AC
  Filled 2020-10-16: qty 2

## 2020-10-16 MED ORDER — BACITRACIN ZINC 500 UNIT/GM EX OINT
TOPICAL_OINTMENT | CUTANEOUS | Status: AC
  Filled 2020-10-16: qty 28.35

## 2020-10-16 MED ORDER — AMISULPRIDE (ANTIEMETIC) 5 MG/2ML IV SOLN: 10.0000 mg | Freq: Once | INTRAVENOUS | Status: DC | PRN

## 2020-10-16 MED ORDER — KETOROLAC TROMETHAMINE 30 MG/ML IJ SOLN
30.0000 mg | Freq: Once | INTRAMUSCULAR | Status: AC
  Administered 2020-10-16: 30 mg via INTRAVENOUS

## 2020-10-16 MED ORDER — FENTANYL CITRATE (PF) 100 MCG/2ML IJ SOLN
25.0000 ug | INTRAMUSCULAR | Status: DC | PRN
  Administered 2020-10-16 (×2): 50 ug via INTRAVENOUS

## 2020-10-16 MED ORDER — ACETAMINOPHEN 10 MG/ML IV SOLN
INTRAVENOUS | Status: AC
  Filled 2020-10-16: qty 100

## 2020-10-16 MED ORDER — ONDANSETRON HCL 4 MG/2ML IJ SOLN
INTRAMUSCULAR | Status: DC | PRN
  Administered 2020-10-16: 4 mg via INTRAVENOUS

## 2020-10-16 MED ORDER — BACITRACIN ZINC 500 UNIT/GM EX OINT
TOPICAL_OINTMENT | CUTANEOUS | Status: DC | PRN
  Administered 2020-10-16: 1 via TOPICAL

## 2020-10-16 MED ORDER — KETOROLAC TROMETHAMINE 15 MG/ML IJ SOLN: 15.0000 mg | Freq: Three times a day (TID) | INTRAMUSCULAR | Status: DC | PRN

## 2020-10-16 MED ORDER — MIDAZOLAM HCL 5 MG/5ML IJ SOLN
INTRAMUSCULAR | Status: DC | PRN
  Administered 2020-10-16: 2 mg via INTRAVENOUS

## 2020-10-16 MED ORDER — LIDOCAINE 2% (20 MG/ML) 5 ML SYRINGE
INTRAMUSCULAR | Status: AC
  Filled 2020-10-16: qty 5

## 2020-10-16 MED ORDER — CLINDAMYCIN HCL 300 MG PO CAPS: 600.0000 mg | ORAL_CAPSULE | Freq: Three times a day (TID) | ORAL | 0 refills | Status: AC

## 2020-10-16 MED ORDER — DEXAMETHASONE SODIUM PHOSPHATE 10 MG/ML IJ SOLN
INTRAMUSCULAR | Status: DC | PRN
  Administered 2020-10-16: 10 mg via INTRAVENOUS

## 2020-10-16 MED ORDER — OXYCODONE HCL 5 MG/5ML PO SOLN: 5.0000 mg | Freq: Once | ORAL | Status: AC | PRN

## 2020-10-16 MED ORDER — CEFAZOLIN SODIUM-DEXTROSE 2-3 GM-%(50ML) IV SOLR
INTRAVENOUS | Status: DC | PRN
  Administered 2020-10-16: 2 g via INTRAVENOUS

## 2020-10-16 MED ORDER — OXYCODONE HCL 5 MG PO TABS
5.0000 mg | ORAL_TABLET | Freq: Four times a day (QID) | ORAL | Status: DC | PRN
  Administered 2020-10-16 – 2020-10-17 (×2): 5 mg via ORAL
  Filled 2020-10-16 (×2): qty 1

## 2020-10-16 MED ORDER — LIDOCAINE 2% (20 MG/ML) 5 ML SYRINGE
INTRAMUSCULAR | Status: DC | PRN
  Administered 2020-10-16: 60 mg via INTRAVENOUS

## 2020-10-16 MED ORDER — PROMETHAZINE HCL 25 MG/ML IJ SOLN: 6.2500 mg | INTRAMUSCULAR | Status: DC | PRN

## 2020-10-16 MED ORDER — 0.9 % SODIUM CHLORIDE (POUR BTL) OPTIME
TOPICAL | Status: DC | PRN
  Administered 2020-10-16: 1000 mL

## 2020-10-16 MED ORDER — OXYMETAZOLINE HCL 0.05 % NA SOLN
NASAL | Status: AC
  Filled 2020-10-16: qty 30

## 2020-10-16 MED ORDER — FENTANYL CITRATE (PF) 100 MCG/2ML IJ SOLN
INTRAMUSCULAR | Status: DC | PRN
  Administered 2020-10-16: 150 ug via INTRAVENOUS

## 2020-10-16 MED ORDER — LACTATED RINGERS IV SOLN: INTRAVENOUS | Status: DC

## 2020-10-16 MED ORDER — ORAL CARE MOUTH RINSE: 15.0000 mL | Freq: Once | OROMUCOSAL | Status: DC

## 2020-10-16 MED ORDER — FENTANYL CITRATE (PF) 250 MCG/5ML IJ SOLN
INTRAMUSCULAR | Status: AC
  Filled 2020-10-16: qty 5

## 2020-10-16 MED ORDER — ROCURONIUM BROMIDE 10 MG/ML (PF) SYRINGE
PREFILLED_SYRINGE | INTRAVENOUS | Status: DC | PRN
  Administered 2020-10-16: 60 mg via INTRAVENOUS

## 2020-10-16 MED ORDER — PROPOFOL 10 MG/ML IV BOLUS
INTRAVENOUS | Status: DC | PRN
  Administered 2020-10-16: 250 mg via INTRAVENOUS

## 2020-10-16 MED ORDER — LIDOCAINE-EPINEPHRINE 1 %-1:100000 IJ SOLN
INTRAMUSCULAR | Status: AC
  Filled 2020-10-16: qty 1

## 2020-10-16 MED ORDER — DEXMEDETOMIDINE (PRECEDEX) IN NS 20 MCG/5ML (4 MCG/ML) IV SYRINGE
PREFILLED_SYRINGE | INTRAVENOUS | Status: AC
  Filled 2020-10-16: qty 5

## 2020-10-16 MED ORDER — LIDOCAINE-EPINEPHRINE 1 %-1:100000 IJ SOLN
INTRAMUSCULAR | Status: DC | PRN
  Administered 2020-10-16: 6 mL

## 2020-10-16 MED ORDER — ONDANSETRON HCL 4 MG/2ML IJ SOLN
INTRAMUSCULAR | Status: AC
  Filled 2020-10-16: qty 2

## 2020-10-16 MED ORDER — OXYCODONE HCL 5 MG PO TABS
5.0000 mg | ORAL_TABLET | Freq: Once | ORAL | Status: AC | PRN
Start: 2020-10-16 — End: 2020-10-16
  Administered 2020-10-16: 5 mg via ORAL

## 2020-10-16 MED ORDER — DEXAMETHASONE SODIUM PHOSPHATE 10 MG/ML IJ SOLN
INTRAMUSCULAR | Status: AC
  Filled 2020-10-16: qty 1

## 2020-10-16 MED ORDER — ACETAMINOPHEN 10 MG/ML IV SOLN
1000.0000 mg | Freq: Once | INTRAVENOUS | Status: DC | PRN
  Administered 2020-10-16: 1000 mg via INTRAVENOUS

## 2020-10-16 MED ORDER — CEFAZOLIN SODIUM-DEXTROSE 2-4 GM/100ML-% IV SOLN
INTRAVENOUS | Status: AC
  Filled 2020-10-16: qty 100

## 2020-10-16 MED ORDER — OXYCODONE HCL 5 MG PO TABS
ORAL_TABLET | ORAL | Status: AC
  Filled 2020-10-16: qty 1

## 2020-10-16 MED FILL — CLINDAMYCIN HCL 300 MG CAPS: 300 | 7 days supply | Qty: 42 | Fill #0

## 2020-10-16 SURGICAL SUPPLY — 56 items
BIT DRILL 1.6X5 (BIT) ×1
BIT DRILL 1.6X5MM (BIT) IMPLANT
BLADE SURG 15 STRL LF DISP TIS (BLADE) IMPLANT
BLADE SURG 15 STRL SS (BLADE) ×6
BUR CROSS CUT FISSURE 1.6 (BURR) ×1 IMPLANT
CANISTER SUCT 3000ML PPV (MISCELLANEOUS) ×2 IMPLANT
CLEANER TIP ELECTROSURG 2X2 (MISCELLANEOUS) IMPLANT
CORD BIPOLAR FORCEPS 12FT (ELECTRODE) ×1 IMPLANT
COVER SURGICAL LIGHT HANDLE (MISCELLANEOUS) ×2 IMPLANT
DRAPE HALF SHEET 40X57 (DRAPES) IMPLANT
DRILL BIT 1.6X5MM (BIT) ×2
ELECT COATED BLADE 2.86 ST (ELECTRODE) ×2 IMPLANT
ELECT NDL BLADE 2-5/6 (NEEDLE) ×1 IMPLANT
ELECT NEEDLE BLADE 2-5/6 (NEEDLE) ×2 IMPLANT
ELECT REM PT RETURN 9FT ADLT (ELECTROSURGICAL) ×2
ELECTRODE REM PT RTRN 9FT ADLT (ELECTROSURGICAL) ×1 IMPLANT
FORCEPS BIPOLAR SPETZLER 8 1.0 (NEUROSURGERY SUPPLIES) ×1 IMPLANT
GAUZE SPONGE 4X4 16PLY XRAY LF (GAUZE/BANDAGES/DRESSINGS) ×1 IMPLANT
GLOVE BIO SURGEON STRL SZ7.5 (GLOVE) ×2 IMPLANT
GLOVE SRG 8 PF TXTR STRL LF DI (GLOVE) ×1 IMPLANT
GLOVE SURG UNDER POLY LF SZ8 (GLOVE) ×2
GOWN STRL REUS W/ TWL LRG LVL3 (GOWN DISPOSABLE) ×2 IMPLANT
GOWN STRL REUS W/TWL LRG LVL3 (GOWN DISPOSABLE) ×4
HEMOSTAT SURGICEL 2X4 FIBR (HEMOSTASIS) ×1 IMPLANT
KIT BASIN OR (CUSTOM PROCEDURE TRAY) ×2 IMPLANT
KIT TURNOVER KIT B (KITS) ×2 IMPLANT
NDL PRECISIONGLIDE 27X1.5 (NEEDLE) ×1 IMPLANT
NEEDLE PRECISIONGLIDE 27X1.5 (NEEDLE) ×2 IMPLANT
NS IRRIG 1000ML POUR BTL (IV SOLUTION) ×2 IMPLANT
PAD ARMBOARD 7.5X6 YLW CONV (MISCELLANEOUS) ×4 IMPLANT
PENCIL BUTTON HOLSTER BLD 10FT (ELECTRODE) ×2 IMPLANT
PLATE MNDBLE STRAIGHT 4 H LONG (Plate) ×1 IMPLANT
POSITIONER HEAD DONUT 9IN (MISCELLANEOUS) IMPLANT
SCISSORS WIRE ANG 4 3/4 DISP (INSTRUMENTS) ×2 IMPLANT
SCREW BONE CROSS PIN 2.0X05MM (Screw) ×3 IMPLANT
SCREW LOCKING 2.0X5MM (Screw) ×1 IMPLANT
SCREW UPPER FACE 2.0X12MM (Screw) ×1 IMPLANT
SCREW UPPER FACE 2.0X8MM (Screw) ×4 IMPLANT
SPONGE INTESTINAL PEANUT (DISPOSABLE) ×1 IMPLANT
STAPLER VISISTAT (STAPLE) ×2 IMPLANT
SUT ETHILON 4 0 CL P 3 (SUTURE) IMPLANT
SUT MON AB 3-0 SH 27 (SUTURE) ×4
SUT MON AB 3-0 SH27 (SUTURE) IMPLANT
SUT PDS AB 3-0 SH 27 (SUTURE) ×1 IMPLANT
SUT PLAIN 5 0 P 3 18 (SUTURE) ×2 IMPLANT
SUT PROLENE 6 0 PC 1 (SUTURE) IMPLANT
SUT STEEL 0 (SUTURE)
SUT STEEL 0 18XMFL TIE 17 (SUTURE) IMPLANT
SUT STEEL 1 (SUTURE) IMPLANT
SUT STEEL 2 (SUTURE) IMPLANT
SUT STEEL 4 (SUTURE) IMPLANT
SUT VICRYL 4-0 PS2 18IN ABS (SUTURE) IMPLANT
TOWEL GREEN STERILE FF (TOWEL DISPOSABLE) ×2 IMPLANT
TRAY ENT MC OR (CUSTOM PROCEDURE TRAY) ×2 IMPLANT
WATER STERILE IRR 1000ML POUR (IV SOLUTION) ×2 IMPLANT
YANKAUER SUCT BULB TIP NO VENT (SUCTIONS) ×1 IMPLANT

## 2020-10-16 NOTE — Anesthesia Preprocedure Evaluation (Addendum)
Anesthesia Evaluation  Patient identified by MRN, date of birth, ID band Patient awake    Reviewed: Allergy & Precautions, NPO status , Patient's Chart, lab work & pertinent test results  Airway Mallampati: IV  TM Distance: >3 FB Neck ROM: Full  Mouth opening: Limited Mouth Opening  Dental no notable dental hx.    Pulmonary Current Smoker and Patient abstained from smoking.,    Pulmonary exam normal breath sounds clear to auscultation       Cardiovascular negative cardio ROS Normal cardiovascular exam Rhythm:Regular Rate:Normal  ECHO: 1. Left ventricular ejection fraction, by estimation, is 65 to 70%. The left ventricle has normal function. The left ventricle has no regional wall motion abnormalities. There is mild left ventricular hypertrophy. Left ventricular diastolic parameters are consistent with Grade I diastolic dysfunction (impaired relaxation). There is the interventricular septum is flattened in systole and diastole, consistent with right ventricular pressure and volume overload. 2. Right ventricular systolic function is normal. The right ventricular size is normal. 3. The mitral valve is normal in structure. No evidence of mitral valve regurgitation. No evidence of mitral stenosis. 4. Tricuspid valve regurgitation is mild to moderate. 5. The aortic valve is normal in structure. Aortic valve regurgitation is not visualized. No aortic stenosis is present. 6. The inferior vena cava is normal in size with greater than 50% respiratory variability, suggesting right atrial pressure of 3 mmHg.   Neuro/Psych PSYCHIATRIC DISORDERS Depression negative neurological ROS     GI/Hepatic negative GI ROS, (+)     substance abuse  , Hepatitis -, C  Endo/Other  negative endocrine ROS  Renal/GU negative Renal ROS     Musculoskeletal negative musculoskeletal ROS (+)   Abdominal   Peds  (+) ADHD Hematology  (+) HIV,    Anesthesia Other Findings Subcondylar fracture right  Reproductive/Obstetrics                            Anesthesia Physical Anesthesia Plan  ASA: III  Anesthesia Plan: General   Post-op Pain Management:    Induction: Intravenous  PONV Risk Score and Plan: 1 and Ondansetron, Dexamethasone, Midazolam and Treatment may vary due to age or medical condition  Airway Management Planned: Nasal ETT  Additional Equipment:   Intra-op Plan:   Post-operative Plan: Extubation in OR  Informed Consent: I have reviewed the patients History and Physical, chart, labs and discussed the procedure including the risks, benefits and alternatives for the proposed anesthesia with the patient or authorized representative who has indicated his/her understanding and acceptance.     Dental advisory given  Plan Discussed with: CRNA  Anesthesia Plan Comments:        Anesthesia Quick Evaluation

## 2020-10-16 NOTE — Progress Notes (Signed)
Family Medicine Teaching Service Daily Progress Note Intern Pager: [phone]  Patient name: [name] Medical record number: [id number] Date of birth: October 24, 1985 Age: 35 y.o. Gender: male  Primary Care Provider: Patient, No Pcp Per (Inactive) Consultants: ENT oral surgery Code Status: Full  Pt Overview and Major Events to Date:  3/31: Admitted   Assessment and Plan: [name] is a 35 y.o. male presenting with facial pain s/p fall via syncopal episode, found to have an acute mandible fracture. PMH is significant for HIV (2021 diagnosis), Chronic Hep C, polysubstance abuse with methamphetamine/heroine, recent nasal fracture in 07/2020.    R Comminuted and displaced Mandibular fracture s/p fall as above: Acute.  CT maxillofacial notable for acute comminuted and displaced subcondylar fracture of the right mandible, at the junction of the condylar neck and ramus and with fractures extending throughout the sigmoid notch, also notable for right perimandibular soft tissue swelling with chronic fracture deformities of the right nasal bone and of the nasal septum. Oral surgery following, appreciate continued recommendations and involvement, surgery planned for today.  - ENT trauma surgery consulted, appreciate recommendations - NPO since 7am - Tylenol 1000mg  q6h PRN for pain, consider a second toradol if worsening pain  - Soft diet prior to and 6 weeks post surgery -clindamycin for 7 days post-operative per oral surgery recs   Fall s/p syncopal event: Acute.  Likely multifactorial due to decreased oral intake and hydration along with polysubstance abuse.  - Continuous cardiac telemetry - f/u echocardiogram  - f/u EKG - AM labs: CBC, BMP - LR @ 170mL/hr - TOC consulted for PCP and medication assistance   Hypokalemia K 3.2 this morning. -Repletion of 40 mEq ordered -monitor BMP  HIV Last seen by ID on 05/12/2020 with HIV RNA quant 371, CD4 569. Currently being treated with Dovato, last ID  note stated low threshold to switch to Symtuza if non-adherent with Dovato (pt endorses compliance).  - pending CD4/CD8 quant - pending HIV RNA quant - Dovato not on formulary, giving individual components of dolutegravir and lamivudine  Chronic hepatitis C Patient currently being seen by ID. Not being treated at this time for hep c until his HIV is under control per ID note on 05/12/2020. HCV RNA quant on 05/12/2020 was 1,670,000. - pending HCV RNA quant  Hx of positive RPR  Syphilis Patient recently seen on 07/30/20 by ED with notable rash concerning for syphilis; RPR was reactive with 1:2 titer and positive T pallidum. Patient left before being treated with PCN. Patient previously treated in 2020 with PCN (at that time his titer was 1:128, most recently 1:2), unsure given patient's HIV status if this is treatment resistance vs reoccurrence. Patient did not disclose this information during interview, concern for unsafe sexual practice and will screen for GC/chlamydia as well (urine Gc/Ch neg on 05/12/2020). - RPR  - Obtain urine GC/Ch - Consider treatment with PCN while admitted - Counseling on safe sex and partner treatment - Consider ID consult  Methamphetamine use Patient reports daily crystal meth use of about 1-1.5g. CIWAs stable at 0.  - consider discontinuing CIWAs - continue to monitor for signs of withdrawal and consider PRN ativan if agitated - UDS - TOC consulted for substance abuse  Tobacco use:  Patient reports smoking 1PPD, interested in the patch. - Nicotine patch 21mg  - TOC consulted for substance abuse  FEN/GI: NPO pending surgery PPx: heparin although patient refused    Status is: Observation   The patient remains OBS appropriate and  will d/c before 2 midnights.  Dispo: The patient is from: Home              Anticipated d/c is to: Home              Patient currently is not medically stable to d/c.   Difficult to place patient  No        Subjective:  Overnight patient refused heparin. Reports has been using meth and heroine for years now. Patient states that he also engages in IV drug use which he recently started about a year ago. Events leading up to this hospitalization include meth use and endorses not having appropriate oral intake along with adequate hydration. He ran to catch the bus when he began to experience tunnel vision. Woke up to finding himself on the concrete, he is unable to describe how long he was there for. Currently sexually active with 51 male partners. Endorsing jaw pain but otherwise is amendable to surgery and remaining in the hospital until medically cleared.    Objective: Temp:  [97.7 F (36.5 C)-98.5 F (36.9 C)] 98 F (36.7 C) (04/01 0452) Pulse Rate:  [72-87] 72 (04/01 0452) Resp:  [12-20] 20 (04/01 0452) BP: (103-144)/(55-94) 103/55 (04/01 0452) SpO2:  [98 %-100 %] 99 % (04/01 0452) Physical Exam: General: Patient sitting upright in bed, in no acute distress. HEENT: 2-3 cm linear hematoma, tenderness along mandibular region bilaterally, supple neck without lymphadenopathy  Cardiovascular: RRR, no murmurs or gallops  Respiratory: CTAB, no wheezing, rales or rhonchi Abdomen: soft, nontender, nondistended, BS+ Extremities: no LE edema noted bilaterally, radial and distal pulses strong and equal bilaterally Neuro: gross sensation intact, appropriately conversational  Psych: mood appropriate   Laboratory: Recent Labs  Lab 10/15/20 1400 10/16/20 0218  WBC 8.0 5.0  HGB 14.6 13.2  HCT 43.0 37.9*  PLT 275 243   Recent Labs  Lab 10/15/20 1400 10/16/20 0218  NA 136 137  K 3.6 3.2*  CL 101 103  CO2 29 28  BUN 12 9  CREATININE 0.94 0.82  CALCIUM 9.1 8.8*  GLUCOSE 108* 96      Imaging/Diagnostic Tests: CT Maxillofacial Wo Contrast  Result Date: 10/15/2020 CLINICAL DATA:  Facial trauma. Additional history provided: Fall, left periorbital laceration, left upper jaw  pain. EXAM: CT MAXILLOFACIAL WITHOUT CONTRAST TECHNIQUE: Multidetector CT imaging of the maxillofacial structures was performed. Multiplanar CT image reconstructions were also generated. COMPARISON:  Maxillofacial CT 08/12/2020. FINDINGS: Osseous: Acute comminuted and displaced subcondylar fracture of the right mandible at the junction of the condylar neck and ramus and with fractures extending through the sigmoid notch. No other acute maxillofacial fracture is identified. Chronic fracture deformities of the right nasal bone and of the nasal septum. Orbits: No acute finding within the orbits. The globes are normal in size and contour. The extraocular muscles and optic nerve sheath complexes are symmetric and unremarkable. Sinuses: No significant paranasal sinus disease. Soft tissues: Right perimandibular soft tissue swelling. Limited intracranial: No evidence of acute intracranial abnormality within the field of view. Other: Poor dentition with multiple absent and carious teeth and multifocal periapical lucencies. IMPRESSION: Acute comminuted and displaced subcondylar fracture of the right mandible, at the junction of the condylar neck and ramus, and with fractures extending through the sigmoid notch. Right perimandibular soft tissue swelling. Chronic fracture deformities of the right nasal bone and of the nasal septum. Electronically Signed   By: Jackey Loge DO   On: 10/15/2020 17:33    Reece Leader,  DO 10/16/2020, 8:20 AM PGY-1, [facility] Family Medicine FPTS Intern pager: [phone], text pages welcome

## 2020-10-16 NOTE — Progress Notes (Addendum)
Went to check on patient after returning from surgery.  He has some pain on the right side near his ear at the site of incision. Incision site looks normal.  Pt complains of feeling like his jaw is offset when he closes it.    Pt just received toradol at 7pm.  Also received 5mg  oxy at approximately same time.  Will give patient oxy 5mg  q6h prn overnight.  Will also add toradol q8h as needed starting at 0300.  Tylenol is already on.    Patient expected to discharge tomorrow.  Adequate pain control will hopefully help prevent him from leaving prior to then.

## 2020-10-16 NOTE — Op Note (Signed)
OPERATIVE REPORT:   Surgeon: Luna Fuse MD, DDS  First Assistant: Shearon Stalls, MD, DDS  Preoperative Diagnosis: Right subcondylar fracture Laceration of the chin (3cm)   Postoperative Diagnosis: same   Procedure Performed: ORIF right subcondylar fracture Repair of laceration of the chin  Anesthesia:  General via nasoendotracheal intubation.   Specimens: None   Drains: None   Cultures: None   Estimated Blood Loss: 25 mL   Dressings: None   Complications: None   Operative Findings: Occlusion was stable and repeatable at the conclusion of the procedure.   Good anatomic reduction of fracture with stable postoperative fixation   Implants: - 4 hole fracture plate 7.0BE  - 2.0 X 19mm bone screws X 4   Indications for Surgery:  [name] is a 35 year old male with a complicated past medical history pertinent for HIV and Hep C who presents s/p fall with right subcondylar fracture. He will require ORIF of the right subcondylar fracture under general anesthesia. The risk/benefits and alternatives were discussed with the patient who has elected to proceed with treatment.    Procedure:  The patient was identified in the precare holding area and medical history and informed consent were reviewed with the patient. All questions were answered. The patient was taken via stretcher to the operating room and placed in the supine position on the operating table. Standard lines and monitors were applied. The patient was preoxygenated and general anesthesia was induced via IV. The patient was intubated nasoendotracheally without complications. The tube was secured. Bilateral breath sounds and end tidal CO2 were noted. Eyes were taped. The bed was turned 90 degrees. The arms were tucked. Extremities with normal range of motion and all pressure points padded. Preoperative antibiotics were administered. The patient was prepped and draped using sterile technique. A throat pack  was placed and noted. 2% lidocaine with epinephrine was injected into the right retromandibular region along the planned incision line.    A 15 blade was used to make an incision posterior to the right mandible beginning 0.5cm inferior to the ear lobe and extending 3cm inferiorly. Dissection was carried to through the platysma that was noted to be thin and carried to the level of the parotid fascia. A 15 blade was used to make an incision in the parotid fascia that paralleled the skin incision line. Blunt dissection was carried down to the posterior border of the mandible. Hemostasis was controlled with bipolar cautery being careful to monitor for the facial nerve. A 15 blade was then made through periosteum of the posterior ramus. Subperiosteal dissection was carried superiorly and inferiorly to reveal the condylar neck fracture. Multiple pieces of displaced and free bone were identified and removed. The proximal segment containing the condyle was positioned in a gross anatomical position and the would was packed.   Attention was then turned intraorally where 4 separate Karlis screws (67mm X1, 8 mm X 3)  were placed between the dentition in the four anterior quadrants at the mucogingival junction. Using 24 gauge wire the maxilla and mandible were brought into maxillomandibular fixation as best possible given the lack of stable dentition posteriorly. Midlines were noted to be in line and the wear facets were appropriately aligned.  Occlusion noted bilaterally, consistent with pretraumatic occlusion.    Attention then turned back to the right subcondylar fracture. The wound was irrigated with copious amounts of normal saline. The posterior border of the ramus was aligned, however the anterior condylar neck was missing a portion  that had been removed previously in the case. The proximal segment was then secured with a 1.31mm 4 hole Stryker plate along the posterior border with twoscrews 68mm bone screws.Next the  distal segment was secured to the two inferior screw holes and secured with two 5 mm bone screws. Fracture and occlusion were then reevaluated evaluated with good anatomic reduction and stable occlusion noted.  The patient was then cut out of maxillomandibular fixation with stable occlusion and good function of the bilateral TMJs.  The IMF screws were removed. The occlusion was found to be stable and repeatable. The surgical sites were then irrigated with saline and Avitene was placed in the surgical site. Hemostasis was confirmed. The parotid fascia was closed in a water tight fashion using multiple buried figure of eight 3-0 PDS sutures. Next the wound was closed in a layered fashion using 3-0 Monocryl sutures and the skin was closed with a running 5-0 fast gut suture and covered with bacitracin.  Attention was then turned to the chin were the laceration was irrigated with copious amounts of normal saline and the wound edges were trimmed to health tissue. The incision was then closed with a 3-0 Monocryl suture followed by multiple simple interrupted 5-0 fast gut sutures an covered with bacitracin.   The oral cavity and oropharynx were irrigated with copious amounts of normal saline and the oropharynx was suctioned. The throat pack was removed under direct visualization. Prep and drapes were removed. The patient was cleansed and dried and turned back to the Anesthesia Care team where he was awakened without event and transferred to the PACU by the Anesthesia Care team for postoperative recovery. The patient was evaluated in the PACU with good ability to open and close his mouth with stable occlusion. The facial nerve was noted to be functioning in all branches.

## 2020-10-16 NOTE — Progress Notes (Signed)
Pt refused heparin, educated the importance of medicine but pt still refused. Notified on call  MD via text page.

## 2020-10-16 NOTE — Anesthesia Procedure Notes (Signed)
Procedure Name: Intubation Date/Time: 10/16/2020 3:34 PM Performed by: Moshe Salisbury, CRNA Pre-anesthesia Checklist: Patient identified, Emergency Drugs available, Suction available and Patient being monitored Patient Re-evaluated:Patient Re-evaluated prior to induction Oxygen Delivery Method: Circle System Utilized Preoxygenation: Pre-oxygenation with 100% oxygen Induction Type: IV induction Ventilation: Mask ventilation without difficulty Laryngoscope Size: Mac and 4 Grade View: Grade I Nasal Tubes: Nasal Rae, Nasal prep performed, Magill forceps- large, utilized and Right Number of attempts: 1 Placement Confirmation: ETT inserted through vocal cords under direct vision,  positive ETCO2 and breath sounds checked- equal and bilateral Secured at: 27 cm Tube secured with: Tape Dental Injury: Teeth and Oropharynx as per pre-operative assessment and Bloody posterior oropharynx

## 2020-10-16 NOTE — Hospital Course (Signed)
Lionardo Haze is a 35 y.o. male presenting with facial pain s/p fall via syncopal episode, found to have an acute mandible fracture. PMH is significant for HIV (2021 diagnosis), Chronic Hep C, polysubstance abuse with methamphetamine/heroine, recent nasal fracture in 07/2020.     Fall s/p syncopal event: Acute.  Patient presented with fall s/p syncopal event in the setting of recent drug use and lack of sleep or oral intake. Echocardiogram was obtained due to EKD abnormalities concerning for possible structural heart abnormalities. Echo showed ***.   R Comminuted and displaced Mandibular fracture s/p fall as above: Acute.  Patient presented with described fracture in the subcondylar region on CT scan. ENT trauma surgery was consulted with ORIF on 4/1***.  Patient discharged with Clindamycin to finish off total of 7 abx days after surgery.   HIV   Chronic Hep C   Positive RPR***    Issues for follow-up: Follow-up with ID regarding HIV and Chronic Hep C ENT surgery follow-up OP in 1-2 weeks

## 2020-10-16 NOTE — H&P (Signed)
History reviewed with patient and unchanged since the initial consult on 10/15/20. All questions answered, patient consented and marked.

## 2020-10-16 NOTE — Transfer of Care (Signed)
Immediate Anesthesia Transfer of Care Note  Patient: [name]  Procedure(s) Performed: OPEN REDUCTION INTERNAL FIXATION (ORIF) SUBCONDYLAR FRACTURE WITH REPAIR OF 3CM CHIN LACERATION (Right Mouth)  Patient Location: PACU  Anesthesia Type:General  Level of Consciousness: drowsy and patient cooperative  Airway & Oxygen Therapy: Patient Spontanous Breathing and Patient connected to nasal cannula oxygen  Post-op Assessment: Report given to RN, Post -op Vital signs reviewed and stable and Patient moving all extremities  Post vital signs: Reviewed and stable  Last Vitals:  Vitals Value Taken Time  BP    Temp    Pulse 86 10/16/20 1824  Resp 24 10/16/20 1824  SpO2 98 % 10/16/20 1824  Vitals shown include unvalidated device data.  Last Pain:  Vitals:   10/16/20 1100  TempSrc: Axillary  PainSc:       Patients Stated Pain Goal: 2 (10/16/20 0018)  Complications: No complications documented.

## 2020-10-16 NOTE — Progress Notes (Signed)
  Echocardiogram 2D Echocardiogram has been performed.  Delcie Roch 10/16/2020, 11:42 AM

## 2020-10-17 DIAGNOSIS — S02600A Fracture of unspecified part of body of mandible, initial encounter for closed fracture: Secondary | ICD-10-CM

## 2020-10-17 DIAGNOSIS — R55 Syncope and collapse: Secondary | ICD-10-CM

## 2020-10-17 LAB — BASIC METABOLIC PANEL
Anion gap: 5 (ref 5–15)
BUN: 5 mg/dL — ABNORMAL LOW (ref 6–20)
CO2: 29 mmol/L (ref 22–32)
Calcium: 9.3 mg/dL (ref 8.9–10.3)
Chloride: 100 mmol/L (ref 98–111)
Creatinine, Ser: 0.94 mg/dL (ref 0.61–1.24)
GFR, Estimated: >60 mL/min (ref >60)
Glucose, Bld: 182 mg/dL — ABNORMAL HIGH (ref 70–99)
Potassium: 4.2 mmol/L (ref 3.5–5.1)
Sodium: 134 mmol/L — ABNORMAL LOW (ref 135–145)

## 2020-10-17 LAB — CBC
HCT: 44.2 % (ref 39.0–52.0)
Hemoglobin: 15.2 g/dL (ref 13.0–17.0)
MCH: 31.5 pg (ref 26.0–34.0)
MCHC: 34.4 g/dL (ref 30.0–36.0)
MCV: 91.7 fL (ref 80.0–100.0)
Platelets: 276 10*3/uL (ref 150–400)
RBC: 4.82 MIL/uL (ref 4.22–5.81)
RDW: 13.1 % (ref 11.5–15.5)
WBC: 8.4 10*3/uL (ref 4.0–10.5)
nRBC: 0 % (ref 0.0–0.2)

## 2020-10-17 LAB — HCV RNA QUANT
HCV Quantitative Log: 6.188 log10 IU/mL (ref >1.70)
HCV Quantitative: 1540000 IU/mL (ref >50)

## 2020-10-17 NOTE — Progress Notes (Signed)
Family Medicine Teaching Service Daily Progress Note Intern Pager: [phone]  Patient name: [name] Medical record number: [id number] Date of birth: 27-Dec-1985 Age: 35 y.o. Gender: male  Primary Care Provider: Patient, No Pcp Per (Inactive) Consultants: Oral surgery Code Status: Full  Pt Overview and Major Events to Date:  4/1-patient mid.  Surgery performed.  Assessment and Plan: [name] a 35 y.o.malepresenting with facial pain s/p fall via syncopal episode, found to have an acute mandible fracture. PMH is significant forHIV (2021 diagnosis), Chronic Hep C, polysubstance abuse with methamphetamine/heroine, recent nasal fracture in 07/2020.  Right comminuted and displaced mandibular fracture S/p ORIF.  Patient complaining of pain at the site of incision and feeling like his is out of line. -Soft, nonchew diet for 6 weeks -Clindamycin x7 days -Pain management: Tylenol, NSAID, oxycodone as needed. -Follow-up appointment with oral surgery in 2 weeks  Syncope Likely due to hypovolemia secondary to decreased oral intake/dehydration, overexertion, and polysubstance abuse.  Echo shows normal systolic function with mild tricuspid regurg and G1 DD.  No arrhythmias on EKG.. -D/C IVF  Polysubstance abuse Recent history of daily methamphetamine use.  Also uses IV drugs, and tobacco.  Likely a contributor to his syncope.  Urine tox showed amphetamine and benzodiazepine. -Transition of care consulted for substance abuse Nicotine patch 21 mg  HIV Followed by ID outpatient.  Last appointment in October 2021.  RNA quant 371, CD4 569 at that time.  CD4 clotted admission 773.  Patient endorses compliance with Dovato. -Dovato not on formulary, giving dolutegravir and lamivuidine. -RNA quant pending  Chronic hepatitis C Followed by ID.  Not being treated for hep C until HIV under control per ID note in October 2021. -Pending HCVRNA quant  History of positive RPR/syphilis In January  patient had reactive RPR with 1: 2 titer and positive T. pallidum.  In 2020 had titer of 1: 128.  RPR is 1: 1 this admission -Follow-up GC/CH -Safe sex counseling  Hypokalemia-resolved -A.m. BMP  FEN/GI: Soft diet PPx: Patient declines.  Disposition: Home  Subjective:  Patient states he is having pain at the site of his incision.  Also complains of feeling like his jaw is not aligned properly.  Is upset that the oral surgeon is going to see him today and he may have to wait until the outpatient appointment for them to evaluate his jaw.  Is also upset about the amount of swelling in his face and amount of days he can be prescribed oxycodone for pain.  Patient states he wants to leave right now.  Objective: Temp:  [97.2 F (36.2 C)-99.2 F (37.3 C)] 97.9 F (36.6 C) (04/02 0335) Pulse Rate:  [64-86] 72 (04/02 0335) Resp:  [15-29] 18 (04/02 0335) BP: (122-145)/(64-88) 139/64 (04/02 0335) SpO2:  [93 %-100 %] 99 % (04/02 0335) FiO2 (%):  [30 %] 30 % (04/01 1930) Physical Exam: General: Alert, sitting up in bed.  Eating oatmeal.   HEENT: Mandible appears approximately in line with maxilla.  No obvious asymmetry between left and right side of the face.  Patient talking without difficulty. Cardiovascular: Patient declined exam Respiratory: No obvious respiratory distress.  No obvious tachypnea. Abdomen: Patient declines exam  Laboratory: Recent Labs  Lab 10/15/20 1400 10/16/20 0218 10/17/20 0051  WBC 8.0 5.0 8.4  HGB 14.6 13.2 15.2  HCT 43.0 37.9* 44.2  PLT 275 243 276   Recent Labs  Lab 10/15/20 1400 10/16/20 0218 10/17/20 0051  NA 136 137 134*  K 3.6 3.2* 4.2  CL 101 103 100  CO2 29 28 29   BUN 12 9 5*  CREATININE 0.94 0.82 0.94  CALCIUM 9.1 8.8* 9.3  GLUCOSE 108* 96 182*     , MD 10/17/2020, 5:43 AM PGY-3, [facility] FPTS Intern pager: [phone], text pages welcome

## 2020-10-17 NOTE — Discharge Summary (Signed)
Family Medicine Teaching [facility] Discharge Summary  Patient name: [name] Medical record number: [id number] Date of birth: 14-Nov-1985 Age: 35 y.o. Gender: male Date of Admission: 10/15/2020  Date of Discharge: 10/17/2020 Admitting Physician: [name] Manners, MD  Primary Care Provider: Patient, No Pcp Per (Inactive) Consultants: oral surgery  Indication for Hospitalization: right subcondylar fracture  Discharge Diagnoses/Problem List:  Right subcondylar fracture  Syncope Substance abuse hiv Hep c hypokalemia  Disposition: patient left AMA  Discharge Condition: improved, stable  Discharge Exam:  Temp:  [97.2 F (36.2 C)-99.2 F (37.3 C)] 97.9 F (36.6 C) (04/02 0335) Pulse Rate:  [64-86] 72 (04/02 0335) Resp:  [15-29] 18 (04/02 0335) BP: (122-145)/(64-88) 139/64 (04/02 0335) SpO2:  [93 %-100 %] 99 % (04/02 0335) FiO2 (%):  [30 %] 30 % (04/01 1930) Physical Exam: General: Alert, sitting up in bed.  Eating oatmeal.   HEENT: Mandible appears approximately in line with maxilla.  No obvious asymmetry between left and right side of the face.  Patient talking without difficulty. Cardiovascular: Patient declined exam Respiratory: No obvious respiratory distress.  No obvious tachypnea. Abdomen: Patient declines exam  Brief Hospital Course:  Patient presented to the ED on 4/1 after suffering a syncopal episode while running to catch a bus in the setting of meth use and subsequently fracturing his mandible.    Oral surgery was consulted and patient had a ORIF performed on 4/1. Pt was assessed post surgery and given oxycodone and toradol for pain control.    The next morning the patient became irritated that oral surgery would not be evaluating him in the hospital and that he was to schedule a follow up with them outpatient.  He was upset that his jaw felt 'offset' after the surgery and that he wasn't getting enough opioid pain medication at discharge.  Patient was advised to  not leave AMA before we could get him his antibiotics and pain medication prescriptions but left immediately after our encounter.    Issues for Follow Up:  1. Mandible fracture - pt needs to schedule f/u appointment with the oral surgeon.  Pt left before we could get him the contact information. 2. Substance abuse - assess for continued substance abuse and provide substance abuse counseling resources.  3. Assess pt's compliance with his HIV medication.    Significant Procedures: ORIF r mandible.   Significant Labs and Imaging:  Recent Labs  Lab 10/15/20 1400 10/16/20 0218 10/17/20 0051  WBC 8.0 5.0 8.4  HGB 14.6 13.2 15.2  HCT 43.0 37.9* 44.2  PLT 275 243 276   Recent Labs  Lab 10/15/20 1400 10/16/20 0218 10/17/20 0051  NA 136 137 134*  K 3.6 3.2* 4.2  CL 101 103 100  CO2 29 28 29   GLUCOSE 108* 96 182*  BUN 12 9 5*  CREATININE 0.94 0.82 0.94  CALCIUM 9.1 8.8* 9.3    Results/Tests Pending at Time of Discharge: none  Discharge Medications:  Allergies as of 10/17/2020   No Known Allergies     Medication List    TAKE these medications   clindamycin 300 MG capsule Commonly known as: CLEOCIN Take 2 capsules (600 mg total) by mouth 3 (three) times daily for 7 days.     ASK your doctor about these medications   acetaminophen 500 MG tablet Commonly known as: TYLENOL Take 500-1,000 mg by mouth every 6 (six) hours as needed for mild pain.   Dovato 50-300 MG Tabs Generic drug: Dolutegravir-lamiVUDine Take 1 tablet by  mouth daily.   ibuprofen 200 MG tablet Commonly known as: ADVIL Take 600 mg by mouth every 6 (six) hours as needed for mild pain.   naloxone 2 MG/2ML injection Commonly known as: NARCAN For suspected opioid overdose, spray 1 mL in each nostril.  Repeat after 3 minutes if no or minimal response.       Discharge Instructions: Please refer to Patient Instructions section of EMR for full details.  Patient was counseled important signs and symptoms that  should prompt return to medical care, changes in medications, dietary instructions, activity restrictions, and follow up appointments.   Follow-Up Appointments:  Follow-up Information    Lovena Neighbours, MD. Call.   Specialty: Plastic Surgery Why: Please call their office when it opens on Monday to schedule an appointment at your earliest convenience. Contact information: [address] [place] [address] [phone]        [facility] Follow up.   Why: Follow-up appointment is scheduled for you for next Friday. Contact information: [address] [place] [address] [phone]              Sandre Kitty, MD 10/20/2020, 8:08 PM PGY-3, [facility]

## 2020-10-17 NOTE — Discharge Instructions (Signed)
You had a fracture of your jaw that was surgically fixed.  He will have some pain due to the injury and surgery.  You can take 500 mg Tylenol every 6 hours, 600 mg of ibuprofen every 8 hours as needed.  We have also given you 3 days of oxycodone.  Please call the surgeon's office when they open up on Monday to schedule an appointment at your earliest convenience.  We have also schedule an appointment for you in our clinic on Friday, April eighth.  We have given you clindamycin to take for the next 7 days.  This is to help prevent bacterial infection.  Please eat a diet that minimizes chewing as much as possible until you are able to follow-up with the surgeon for further instructions.    Jaw Fracture Eating Plan A break (fracture) of the jaw bone often needs surgery for treatment. After surgery, you will need to eat foods that can be blended so that they can be sipped from a straw or given through a syringe. Work with a diet and nutrition specialist (dietitian) to create an eating plan that helps you get the nutrients you need in order to heal and stay healthy. What are tips for following this plan? General guidelines  All foods in this plan must be blended. Avoid nuts, seeds, skins, peels, bones, or any foods that cannot be blended to the right consistency.  Ask your health care provider about taking a liquid multivitamin to make sure that you get all the vitamins and minerals you need. Cooking  Before blending, remove any skins, seeds, or peels from food.  Cook meats and vegetables until tender.  Cut foods into small pieces and mix with a small amount of liquid in a food processor or blender. Continue to add liquid until the food becomes thin enough to sip through a straw.  Add liquids such as juice, milk, cream, broth, gravy, or vegetable juice to help add flavor to foods.  Heat foods after they have been blended, not before. This reduces the amount of foam created from blending.  If  you need to increase calories in food: ? Add protein powder or powdered milk to foods. ? Cook with fats, such as margarine (without trans fat), sour cream, cream cheese, cream, or nut butters. ? Prepare foods with sweeteners, such as honey, ice cream, blackstrap molasses, or sugar.   Meal planning  Eat at least three meals and three snacks daily. It is important to make sure that you get enough calories and protein to prevent weight loss and help your body heal, especially after surgery.  Eat a variety of foods from each food group every day, including fruits and vegetables, protein, whole grains, dairy, and healthy fats.  If your teeth and mouth are sensitive to extreme temperatures, heat or cool your foods to lukewarm temperatures. What foods are recommended? The items listed may not be a complete list. Talk with your dietitian about what dietary choices are best for you. Grains Hot cereals, such as oatmeal, grits, ground wheat cereals, and polenta. Rice and pasta. Couscous. Vegetables All cooked or canned vegetables, without seeds and skins. Vegetable juices. Cooked potatoes, without skins. Fruits Any cooked or canned fruits, without seeds and skins. Fresh, peeled soft fruits, such as bananas and peaches, that can be blended until smooth. All fruit juices, without seeds and skins. Meat and other protein foods Soft-boiled eggs, scrambled eggs, powdered eggs, pasteurized egg mixtures, and custard. Ground meats, such as hamburger, [place],  sausage, and meatloaf. Tender, well-cooked meat, poultry, and fish, prepared without bones or skin. Soft soy foods, such as tofu. Smooth nut butters. Liquid egg substitutes. Dairy Milk. Cheese. Yogurt. Cottage cheese. Pudding. Beverages Coffee (regular or decaffeinated), tea, and mineral water. Liquid supplements that have protein and calories. Fats and oils Any oils. Melted margarine or butter. Ghee. Sour cream. Cream cheese. Avocado. Seasoning and other  foods All seasonings and condiments that blend well. Ground spices. Finely ground seeds and nuts. Mustard or any smooth condiment. Summary  Foods in this plan need to be prepared so that they can be sipped from a straw or given through a syringe. Try to have at least three meals and three snacks daily.  Avoid nuts, seeds, skins, peels, bones, or any foods that cannot be blended to the right consistency. Make sure you eat a variety of foods from each food group every day.  Include a liquid multivitamin in your plan as told by your health care provider or dietitian. This information is not intended to replace advice given to you by your health care provider. Make sure you discuss any questions you have with your health care provider. Document Revised: 10/26/2018 Document Reviewed: 10/11/2016 Elsevier Patient Education  2021 [employer].

## 2020-10-17 NOTE — Anesthesia Postprocedure Evaluation (Signed)
Anesthesia Post Note  Patient: [name]  Procedure(s) Performed: OPEN REDUCTION INTERNAL FIXATION (ORIF) SUBCONDYLAR FRACTURE WITH REPAIR OF 3CM CHIN LACERATION (Right Mouth)     Anesthesia Type: General Anesthetic complications: no Comments: Patient discharged prior to post-op visit.  No apparent anesthetic complications by PACU and chart review.   No complications documented.  Last Vitals:  Vitals:   10/16/20 2012 10/17/20 0335  BP: (!) 141/87 139/64  Pulse: 72 72  Resp: 18 18  Temp: 36.5 C 36.6 C  SpO2: 98% 99%    Last Pain:  Vitals:   10/17/20 0525  TempSrc:   PainSc: 3                  Emersen Mascari,E. Mendi Constable

## 2020-10-18 LAB — URINE CYTOLOGY ANCILLARY ONLY
Chlamydia: NEGATIVE
Comment: NEGATIVE
Comment: NORMAL
Neisseria Gonorrhea: NEGATIVE

## 2020-10-19 ENCOUNTER — Encounter (HOSPITAL_COMMUNITY): Payer: Self-pay | Admitting: Student

## 2020-10-19 LAB — T.PALLIDUM AB, TOTAL: T Pallidum Abs: REACTIVE — AB

## 2020-10-21 LAB — CULTURE, BLOOD (ROUTINE X 2)
Culture: NO GROWTH
Culture: NO GROWTH
Special Requests: ADEQUATE
Special Requests: ADEQUATE

## 2020-10-23 ENCOUNTER — Ambulatory Visit: Payer: Self-pay | Admitting: Family Medicine

## 2020-11-01 IMAGING — CR DG CHEST 2V
2 series · 2 of 2 positions shown · non-contrast
Comparison: Radiograph 06/23/2018

CLINICAL DATA: Cough

EXAM:
CHEST - 2 VIEW

[chest pa]
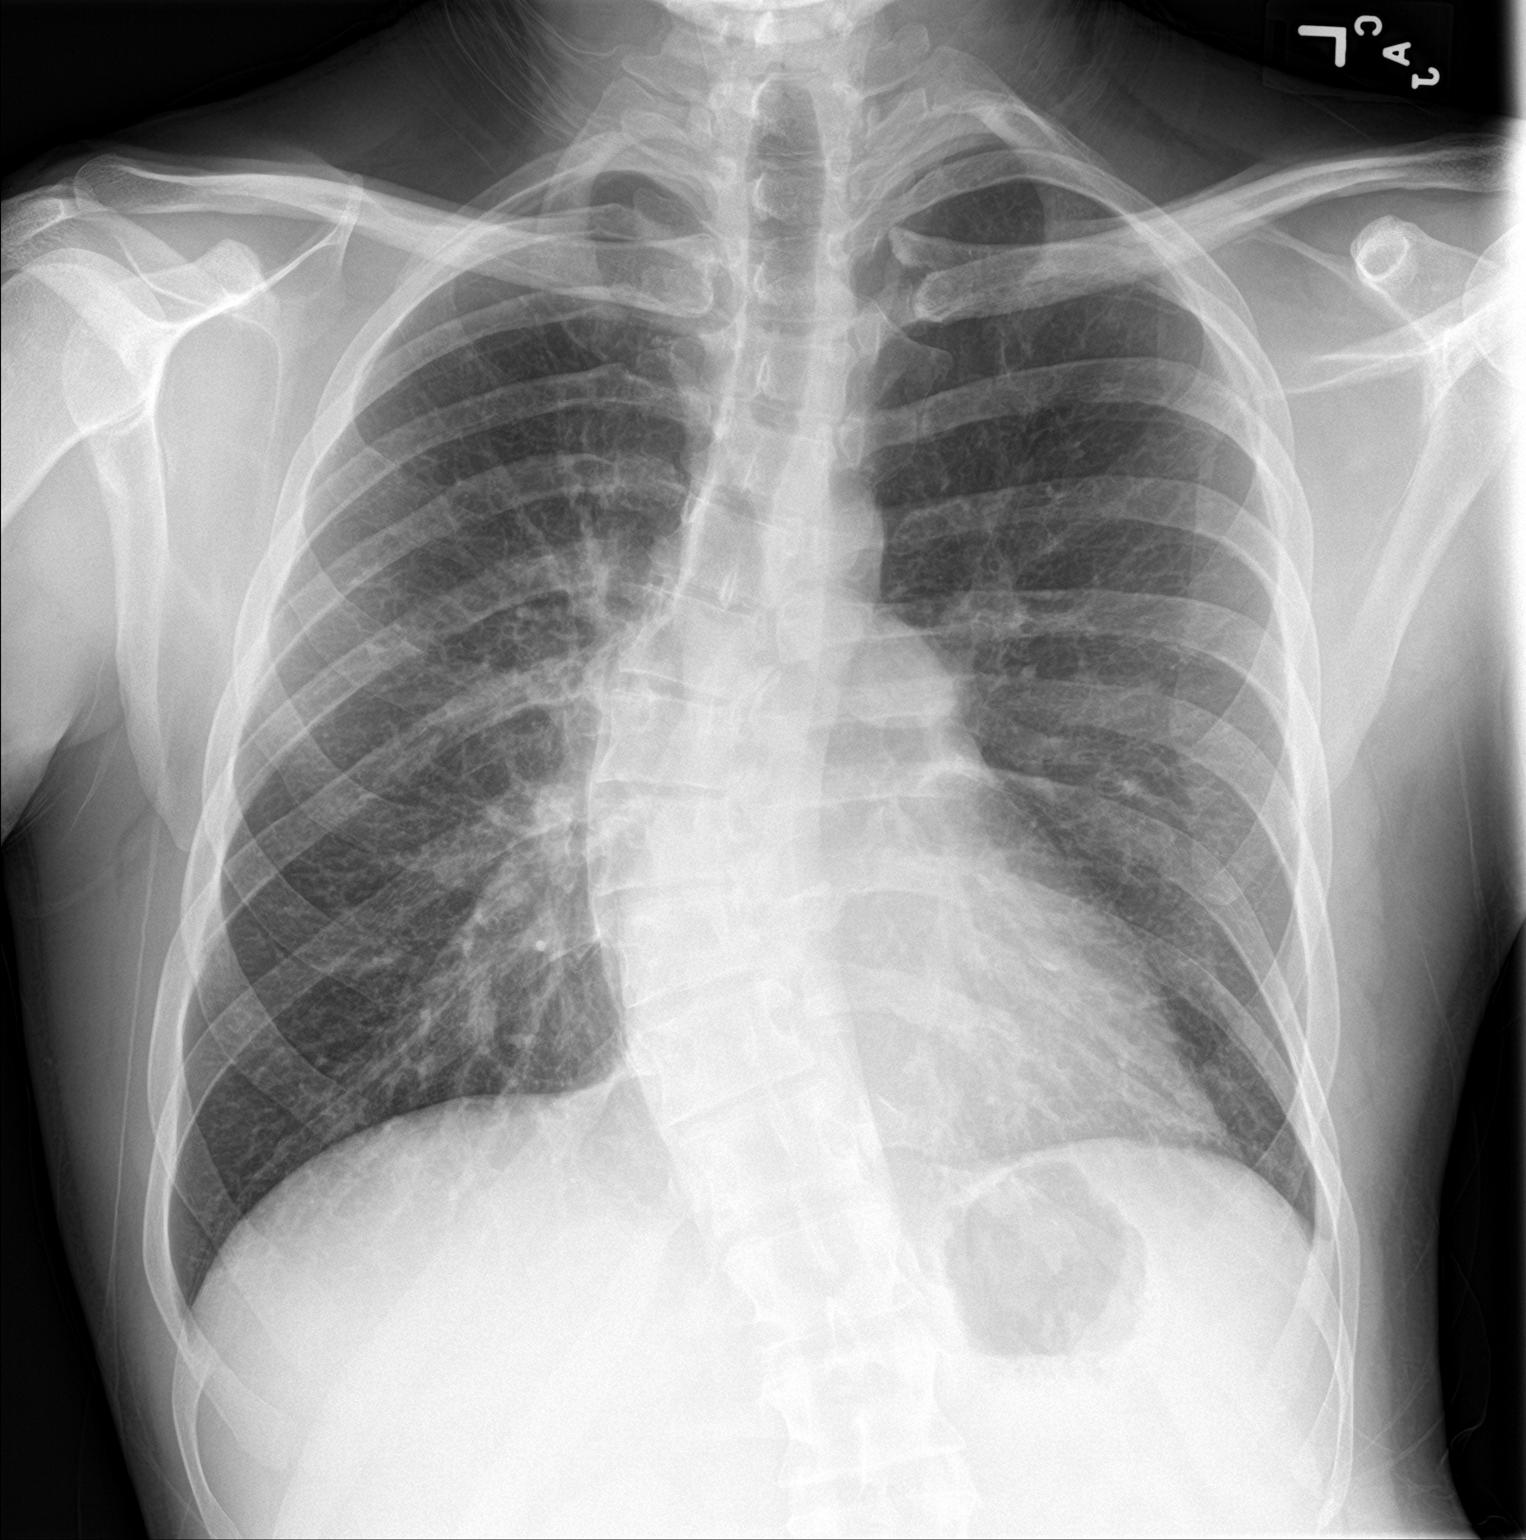

[chest lat]
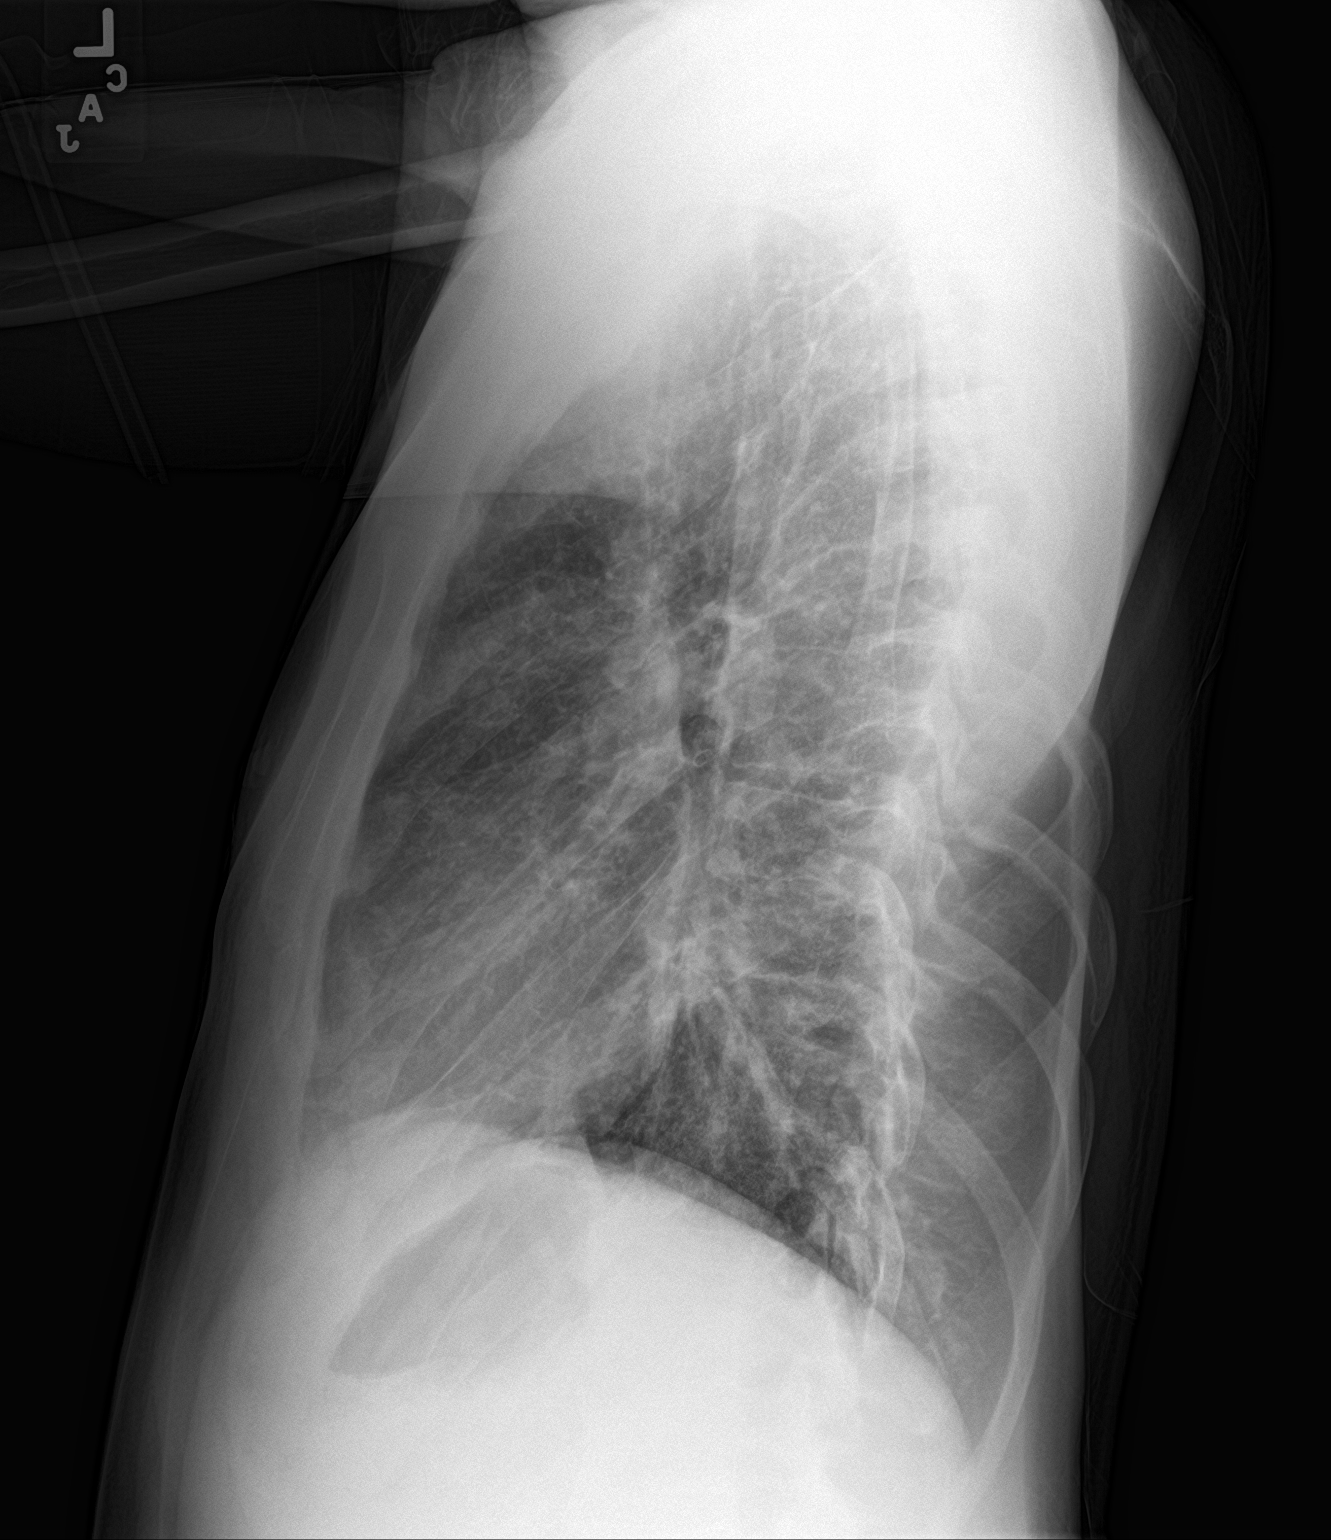

[2 of 2 positions shown; findings below may reference images not displayed]

FINDINGS: No consolidation, features of edema, pneumothorax, or effusion.
Pulmonary vascularity is normally distributed. The cardiomediastinal
contours are unremarkable. No acute osseous or soft tissue
abnormality. Marked dextrocurvature of the lower thoracic spine is
similar to comparison exams. Associated chest wall deformity.
IMPRESSION: No acute cardiopulmonary abnormality.

## 2020-11-08 ENCOUNTER — Other Ambulatory Visit: Payer: Self-pay | Admitting: Infectious Disease

## 2020-12-28 ENCOUNTER — Other Ambulatory Visit: Payer: Self-pay | Admitting: Family

## 2020-12-28 DIAGNOSIS — B20 Human immunodeficiency virus [HIV] disease: Secondary | ICD-10-CM

## 2020-12-28 NOTE — Progress Notes (Unsigned)
.  omm

## 2020-12-30 ENCOUNTER — Other Ambulatory Visit: Payer: Self-pay

## 2020-12-30 ENCOUNTER — Emergency Department (HOSPITAL_COMMUNITY)
Admission: EM | Admit: 2020-12-30 | Discharge: 2020-12-30 | Disposition: A | Discharge status: Home / Self Care | Payer: Self-pay | Attending: Emergency Medicine | Admitting: Emergency Medicine

## 2020-12-30 ENCOUNTER — Encounter (HOSPITAL_COMMUNITY): Payer: Self-pay

## 2020-12-30 DIAGNOSIS — Z5321 Procedure and treatment not carried out due to patient leaving prior to being seen by health care provider: Secondary | ICD-10-CM | POA: Insufficient documentation

## 2020-12-30 DIAGNOSIS — K6289 Other specified diseases of anus and rectum: Secondary | ICD-10-CM | POA: Insufficient documentation

## 2020-12-30 NOTE — ED Triage Notes (Signed)
Pt states that he has been having rectal pain for the past week, worse with BM, denies insertion of objects.

## 2020-12-30 NOTE — ED Provider Notes (Signed)
Emergency Medicine Provider Triage Evaluation Note  [name] , a 35 y.o. male  was evaluated in triage.  Pt complains of rectal pain.  States ongoing for several days now but nothing better going on tonight so wanted to get it checked out.  Denies trauma, FB insertion, or anal intercourse.  He states it feels "chaffed".  Denies bleeding or drainage.  Review of Systems  Positive: Rectal pain Negative: Bleeding, abdominal pain  Physical Exam  BP (!) 142/97 (BP Location: Left Arm)   Pulse 98   Temp 98.3 F (36.8 C) (Oral)   Resp 20   SpO2 97%  Gen:   Awake, no distress   Resp:  Normal effort  MSK:   Moves extremities without difficulty  Medical Decision Making  Medically screening exam initiated at 12:13 AM.  Appropriate orders placed.  Darin Arndt was informed that the remainder of the evaluation will be completed by another provider, this initial triage assessment does not replace that evaluation, and the importance of remaining in the ED until their evaluation is complete.    Garlon Hatchet, PA-C 12/30/20 0014    Milagros Loll, MD 12/31/20 [phone]

## 2020-12-30 NOTE — ED Notes (Signed)
Called 3x for vitals, no answer 

## 2020-12-30 NOTE — ED Notes (Signed)
Called pt for vitals, no response x1 

## 2021-01-01 ENCOUNTER — Ambulatory Visit: Payer: Self-pay | Admitting: Pharmacist

## 2021-01-01 IMAGING — DX DG CHEST 2V
2 series · 2 of 2 positions shown · non-contrast
Comparison: 07/14/2019

CLINICAL DATA: Chronic shortness of breath for several months,
history of drug abuse, HIV

EXAM:
CHEST - 2 VIEW

[chest pa]
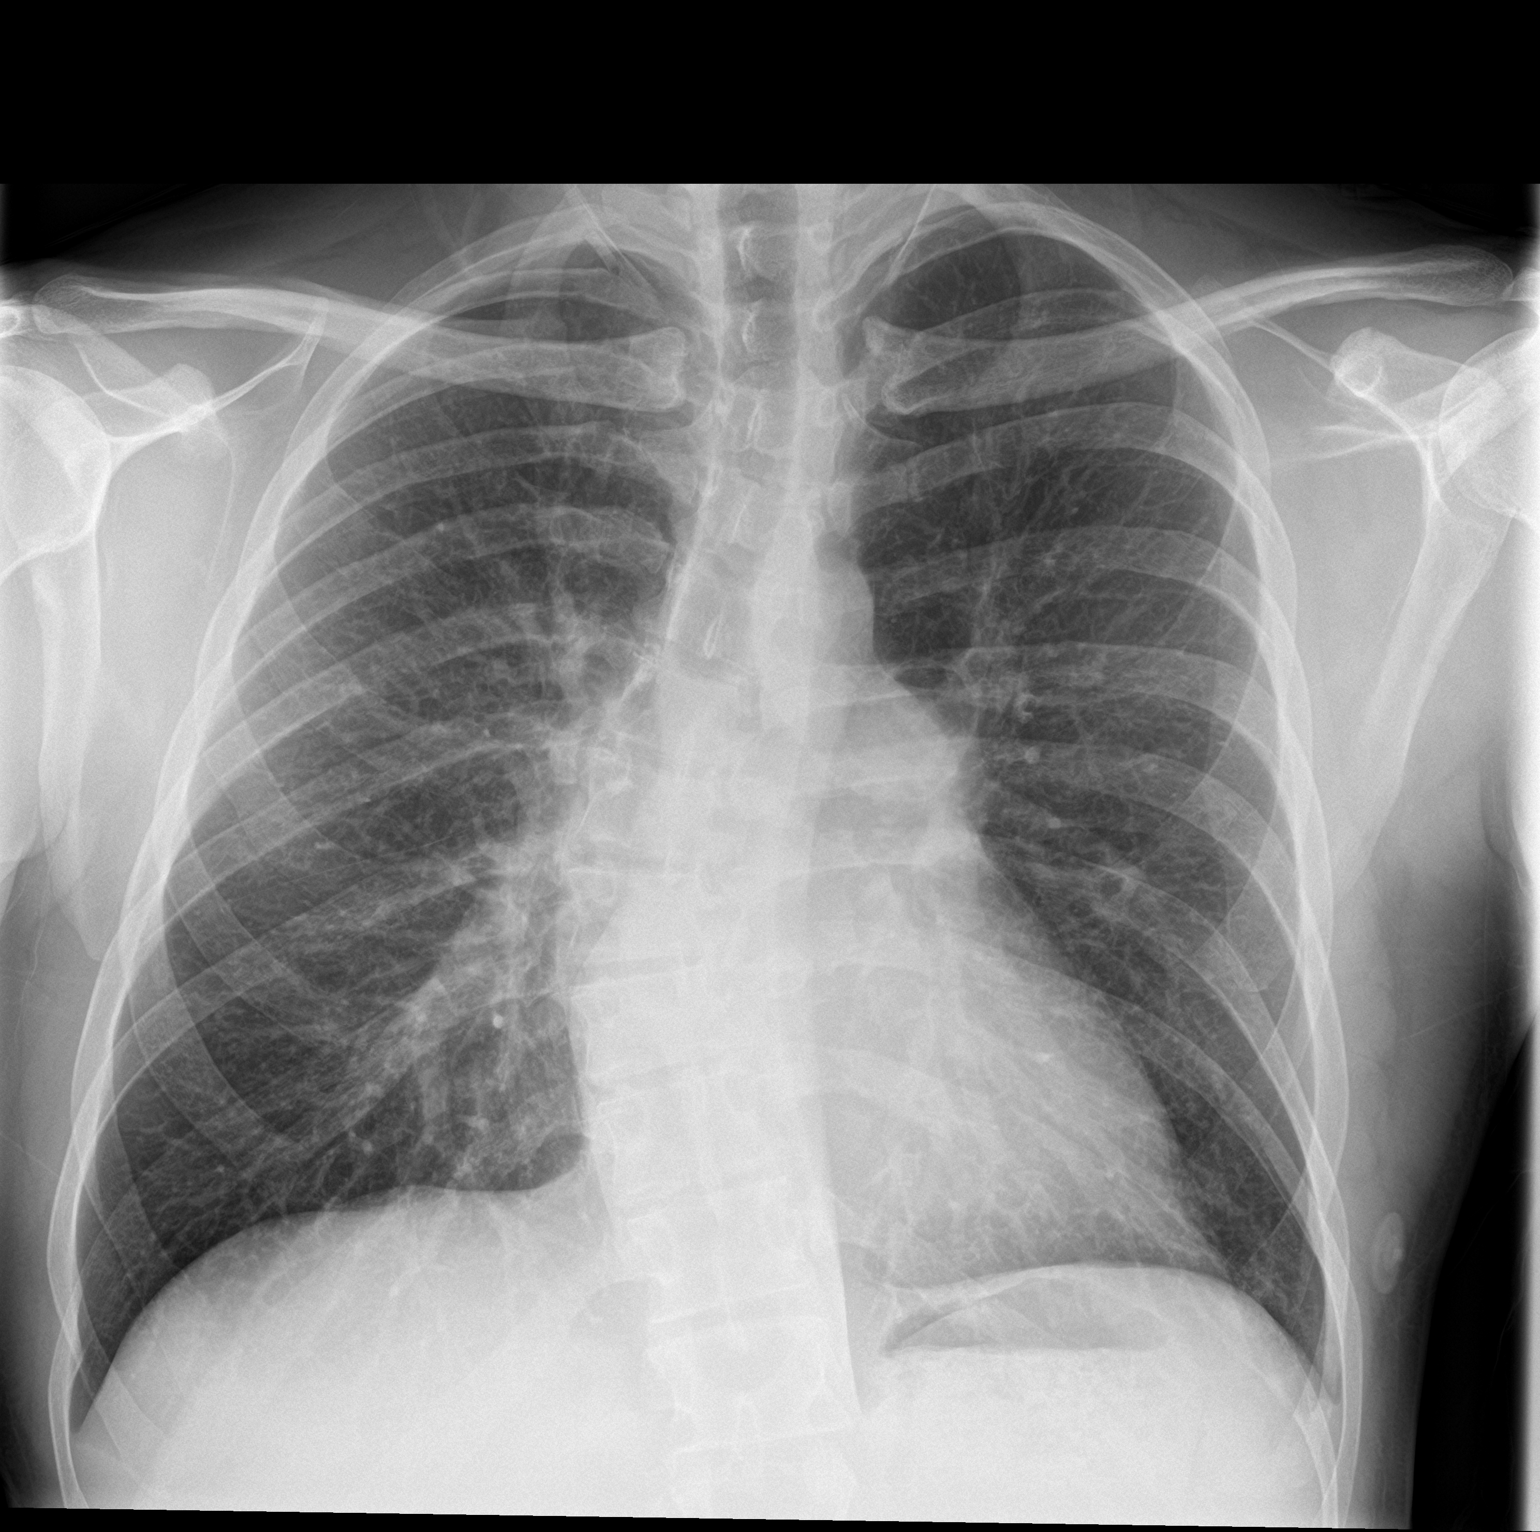

[chest lat]
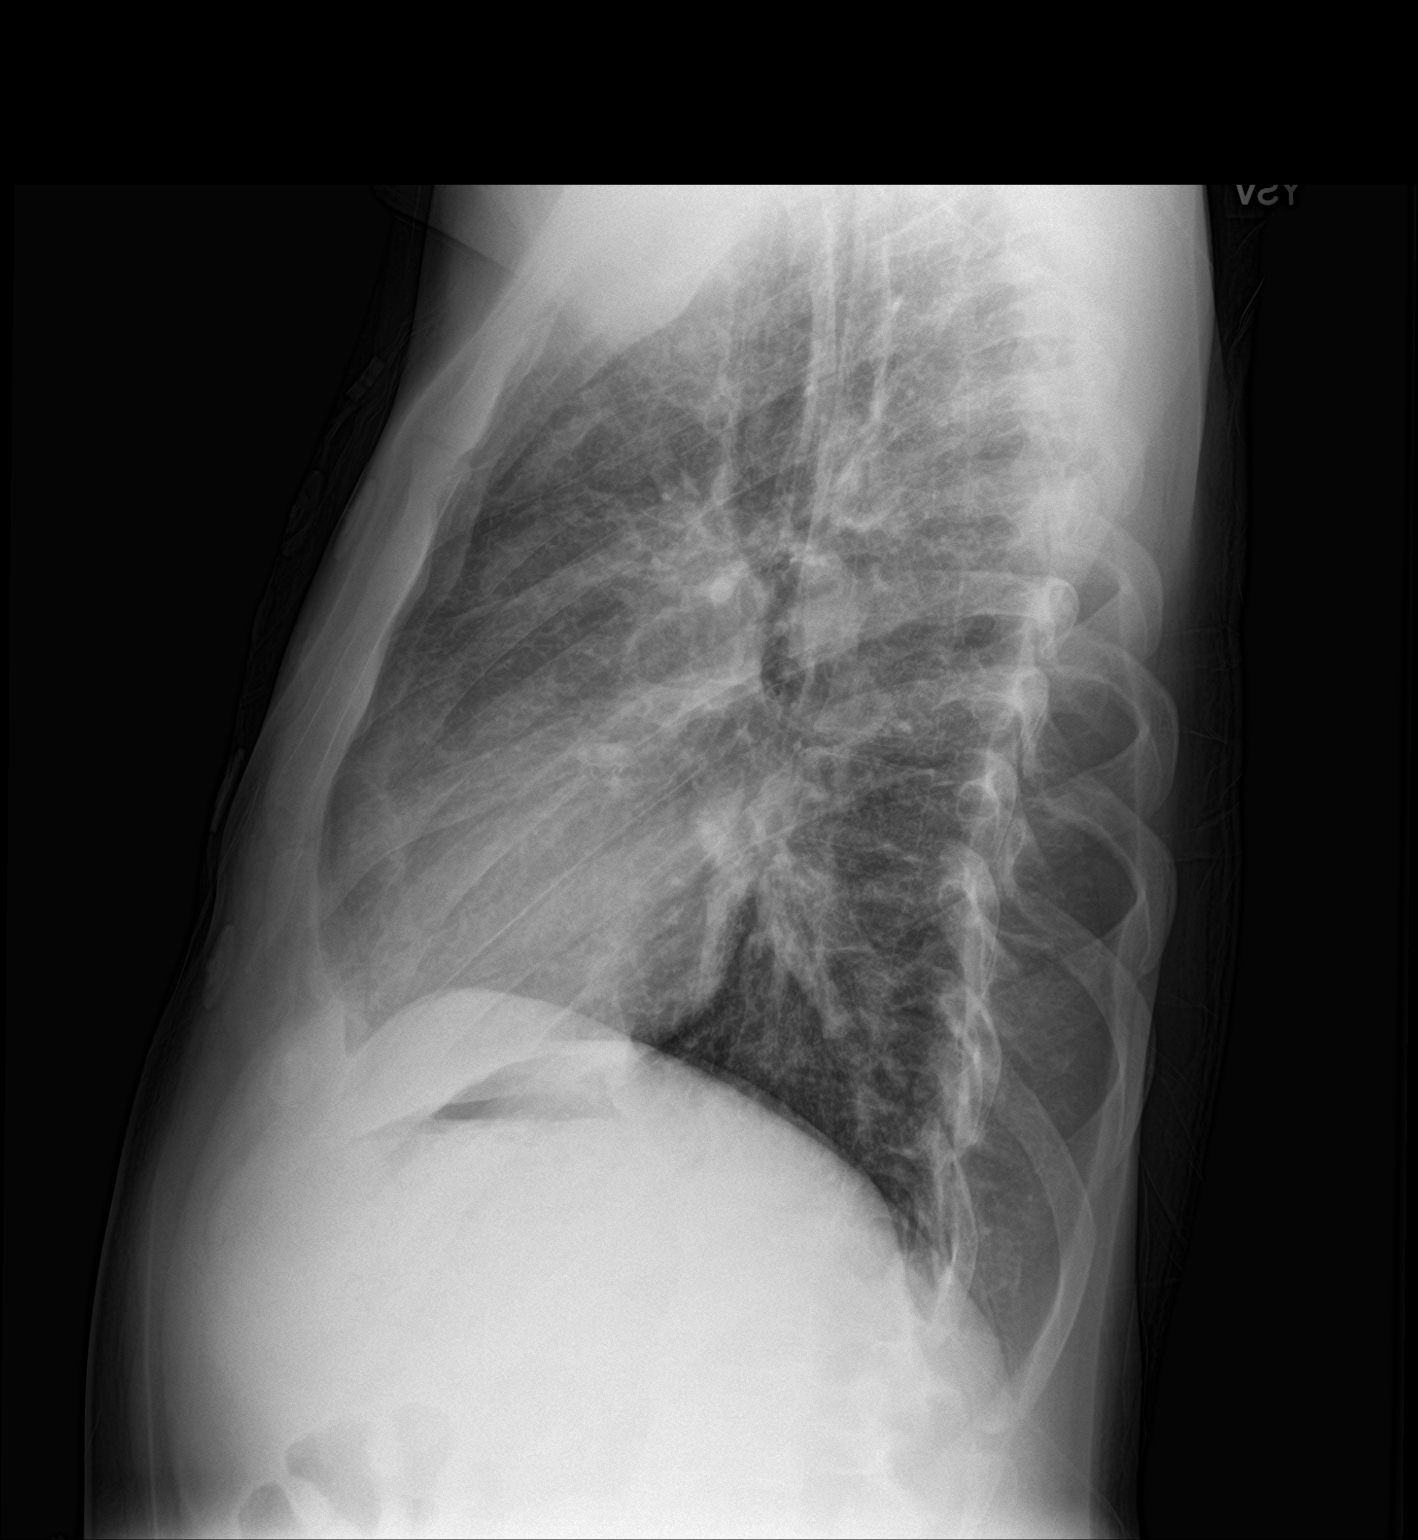

[2 of 2 positions shown; findings below may reference images not displayed]

FINDINGS: Frontal and lateral views of the chest demonstrate a stable cardiac
silhouette. No airspace disease, effusion, or pneumothorax. Stable
right convex scoliosis.
IMPRESSION: 1. No acute intrathoracic process.

## 2021-01-13 ENCOUNTER — Other Ambulatory Visit: Payer: Self-pay | Admitting: Family

## 2021-01-13 DIAGNOSIS — B20 Human immunodeficiency virus [HIV] disease: Secondary | ICD-10-CM

## 2021-01-15 ENCOUNTER — Emergency Department (HOSPITAL_COMMUNITY)
Admission: EM | Admit: 2021-01-15 | Discharge: 2021-01-15 | Disposition: A | Discharge status: Home / Self Care | Payer: Self-pay | Attending: Emergency Medicine | Admitting: Emergency Medicine

## 2021-01-15 DIAGNOSIS — M79672 Pain in left foot: Secondary | ICD-10-CM | POA: Insufficient documentation

## 2021-01-15 DIAGNOSIS — M79671 Pain in right foot: Secondary | ICD-10-CM | POA: Insufficient documentation

## 2021-01-15 DIAGNOSIS — Z5321 Procedure and treatment not carried out due to patient leaving prior to being seen by health care provider: Secondary | ICD-10-CM | POA: Insufficient documentation

## 2021-01-15 NOTE — ED Notes (Signed)
Pt denies recent injury. No redness, swelling, injury noted to either foot.

## 2021-01-15 NOTE — ED Triage Notes (Signed)
Pt c/o 4/10 "worst pain ever" to bilateral feet; states it's from "walking everywhere, all the time." States he's got the "worst flat feet ever."

## 2021-01-15 NOTE — ED Notes (Signed)
Patient no longer on hospital grounds 

## 2021-01-29 ENCOUNTER — Emergency Department (HOSPITAL_COMMUNITY)
Admission: EM | Admit: 2021-01-29 | Discharge: 2021-01-29 | Disposition: A | Discharge status: Home / Self Care | Payer: Self-pay | Attending: Emergency Medicine | Admitting: Emergency Medicine

## 2021-01-29 ENCOUNTER — Other Ambulatory Visit: Payer: Self-pay

## 2021-01-29 ENCOUNTER — Encounter (HOSPITAL_COMMUNITY): Payer: Self-pay | Admitting: *Deleted

## 2021-01-29 ENCOUNTER — Emergency Department (HOSPITAL_COMMUNITY): Payer: Self-pay

## 2021-01-29 DIAGNOSIS — R197 Diarrhea, unspecified: Secondary | ICD-10-CM | POA: Insufficient documentation

## 2021-01-29 DIAGNOSIS — R112 Nausea with vomiting, unspecified: Secondary | ICD-10-CM | POA: Insufficient documentation

## 2021-01-29 DIAGNOSIS — Z21 Asymptomatic human immunodeficiency virus [HIV] infection status: Secondary | ICD-10-CM | POA: Insufficient documentation

## 2021-01-29 DIAGNOSIS — F17210 Nicotine dependence, cigarettes, uncomplicated: Secondary | ICD-10-CM | POA: Insufficient documentation

## 2021-01-29 DIAGNOSIS — R109 Unspecified abdominal pain: Secondary | ICD-10-CM

## 2021-01-29 DIAGNOSIS — R1084 Generalized abdominal pain: Secondary | ICD-10-CM | POA: Insufficient documentation

## 2021-01-29 LAB — COMPREHENSIVE METABOLIC PANEL
ALT: 31 U/L (ref 0–44)
AST: 29 U/L (ref 15–41)
Albumin: 4.1 g/dL (ref 3.5–5.0)
Alkaline Phosphatase: 63 U/L (ref 38–126)
Anion gap: 8 (ref 5–15)
BUN: 9 mg/dL (ref 6–20)
CO2: 26 mmol/L (ref 22–32)
Calcium: 9.8 mg/dL (ref 8.9–10.3)
Chloride: 102 mmol/L (ref 98–111)
Creatinine, Ser: 0.93 mg/dL (ref 0.61–1.24)
GFR, Estimated: >60 mL/min (ref >60)
Glucose, Bld: 98 mg/dL (ref 70–99)
Potassium: 3.7 mmol/L (ref 3.5–5.1)
Sodium: 136 mmol/L (ref 135–145)
Total Bilirubin: 0.7 mg/dL (ref 0.3–1.2)
Total Protein: 8.5 g/dL — ABNORMAL HIGH (ref 6.5–8.1)

## 2021-01-29 LAB — URINALYSIS, ROUTINE W REFLEX MICROSCOPIC
Bacteria, UA: NONE SEEN
Bilirubin Urine: NEGATIVE
Glucose, UA: NEGATIVE mg/dL
Hgb urine dipstick: NEGATIVE
Ketones, ur: NEGATIVE mg/dL
Leukocytes,Ua: NEGATIVE
Nitrite: NEGATIVE
Protein, ur: 30 mg/dL — AB
Specific Gravity, Urine: >1.046 — ABNORMAL HIGH (ref 1.005–1.030)
pH: 6 (ref 5.0–8.0)

## 2021-01-29 LAB — CBC WITH DIFFERENTIAL/PLATELET
Abs Immature Granulocytes: 0.03 10*3/uL (ref 0.00–0.07)
Basophils Absolute: 0 10*3/uL (ref 0.0–0.1)
Basophils Relative: 0 %
Eosinophils Absolute: 0.3 10*3/uL (ref 0.0–0.5)
Eosinophils Relative: 3 %
HCT: 49.9 % (ref 39.0–52.0)
Hemoglobin: 16.9 g/dL (ref 13.0–17.0)
Immature Granulocytes: 0 %
Lymphocytes Relative: 21 %
Lymphs Abs: 2.3 10*3/uL (ref 0.7–4.0)
MCH: 30.5 pg (ref 26.0–34.0)
MCHC: 33.9 g/dL (ref 30.0–36.0)
MCV: 90.1 fL (ref 80.0–100.0)
Monocytes Absolute: 0.9 10*3/uL (ref 0.1–1.0)
Monocytes Relative: 8 %
Neutro Abs: 7.6 10*3/uL (ref 1.7–7.7)
Neutrophils Relative %: 68 %
Platelets: 295 10*3/uL (ref 150–400)
RBC: 5.54 MIL/uL (ref 4.22–5.81)
RDW: 12.8 % (ref 11.5–15.5)
WBC: 11.1 10*3/uL — ABNORMAL HIGH (ref 4.0–10.5)
nRBC: 0 % (ref 0.0–0.2)

## 2021-01-29 LAB — LIPASE, BLOOD: Lipase: 48 U/L (ref 11–51)

## 2021-01-29 MED ORDER — ONDANSETRON 4 MG PO TBDP: 4.0000 mg | ORAL_TABLET | Freq: Three times a day (TID) | ORAL | 0 refills | Status: DC | PRN

## 2021-01-29 MED ORDER — ONDANSETRON HCL 4 MG/2ML IJ SOLN
4.0000 mg | Freq: Once | INTRAMUSCULAR | Status: AC
  Administered 2021-01-29: 4 mg via INTRAVENOUS
  Filled 2021-01-29: qty 2

## 2021-01-29 MED ORDER — KETOROLAC TROMETHAMINE 15 MG/ML IJ SOLN
15.0000 mg | Freq: Once | INTRAMUSCULAR | Status: AC
  Administered 2021-01-29: 15 mg via INTRAVENOUS
  Filled 2021-01-29: qty 1

## 2021-01-29 MED ORDER — IOHEXOL 300 MG/ML  SOLN
100.0000 mL | Freq: Once | INTRAMUSCULAR | Status: AC | PRN
  Administered 2021-01-29: 100 mL via INTRAVENOUS

## 2021-01-29 NOTE — ED Provider Notes (Signed)
[facility] EMERGENCY DEPARTMENT Provider Note   CSN: [id number] Arrival date & time: 01/29/21  1516     History Chief Complaint  Patient presents with   Abdominal Pain    [name] is a 35 y.o. male.  Patient presents with abdominal pain nausea vomiting and diarrhea ongoing for 2 days now.  Denies fevers denies cough.  Describes vomitus is nonbloody nonbilious.  Complaining of diffuse abdominal pain.  Patient otherwise homeless.  He states is not sure if it was something he ate or not.      Past Medical History:  Diagnosis Date   ADHD (attention deficit hyperactivity disorder)    Chronic hepatitis C without hepatic coma (HCC) 05/12/2020   Drug abuse (HCC)    meth, marijuana   HIV positive (HCC)     Patient Active Problem List   Diagnosis Date Noted   Closed fracture of body of mandible (HCC)    Syncope and collapse    Fall 10/15/2020   Chronic hepatitis C without hepatic coma (HCC) 05/12/2020   Methamphetamine intoxication (HCC) 12/20/2019   AKI (acute kidney injury) (HCC) 12/20/2019   Rhabdomyolysis 12/20/2019   Elevated transaminase level 12/20/2019   Elevated CK    Methamphetamine use disorder, severe, dependence (HCC) 07/10/2019   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   MDD (major depressive disorder), recurrent severe, 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   HIV (human immunodeficiency virus infection) (HCC) 06/05/2019    Past Surgical History:  Procedure Laterality Date   ORIF MANDIBULAR FRACTURE Right 10/16/2020   Procedure: OPEN REDUCTION INTERNAL FIXATION (ORIF) SUBCONDYLAR FRACTURE WITH REPAIR OF 3CM CHIN LACERATION;  Surgeon: Lovena Neighbours, MD;  Location: [facility] OR;  Service: Oral Surgery;  Laterality: Right;       Family History  Problem Relation Age of Onset   Diabetes Mother    Hypertension Mother    Cancer Mother    Cancer Father    Diabetes Father    Hypertension Father     Social History   Tobacco  Use   Smoking status: Every Day    Packs/day: 1.00    Years: 10.00    Pack years: 10.00    Types: Cigarettes   Smokeless tobacco: Never  Vaping Use   Vaping Use: Never used  Substance Use Topics   Alcohol use: Not Currently   Drug use: Yes    Types: Methamphetamines    Comment: last used yesterday    Home Medications Prior to Admission medications   Medication Sig Start Date End Date Taking? Authorizing Provider  ondansetron (ZOFRAN ODT) 4 MG disintegrating tablet Take 1 tablet (4 mg total) by mouth every 8 (eight) hours as needed for up to 10 doses for nausea or vomiting. 01/29/21  Yes Debie Ashline, Eustace Moore, MD  acetaminophen (TYLENOL) 500 MG tablet Take 500-1,000 mg by mouth every 6 (six) hours as needed for mild pain.    [provider]  clindamycin (CLEOCIN) 300 MG capsule TAKE 2 CAPSULES (600 MG TOTAL) BY MOUTH 3 (THREE) TIMES DAILY FOR 7 DAYS. 10/16/20 10/16/21  Simmons-Robinson, Tawanna Cooler, MD  DOVATO 50-300 MG TABS TAKE 1 TABLET BY MOUTH DAILY 11/09/20   Daiva Eves, Lisette Grinder, MD  ibuprofen (ADVIL) 200 MG tablet Take 600 mg by mouth every 6 (six) hours as needed for mild pain.    [provider]  naloxone Jonelle Sports) 2 MG/2ML injection For suspected opioid overdose, spray 1 mL in each nostril.  Repeat after 3 minutes if no or minimal  response. 12/24/19   [provider]  omeprazole (PRILOSEC) 20 MG capsule Take 1 capsule (20 mg total) by mouth daily. Patient not taking: Reported on 02/14/2019 06/24/18 02/14/19  Roxy Horseman, PA-C    Allergies    Patient has no known allergies.  Review of Systems   Review of Systems  Constitutional:  Negative for fever.  HENT:  Negative for ear pain and sore throat.   Eyes:  Negative for pain.  Respiratory:  Negative for cough.   Cardiovascular:  Negative for chest pain.  Gastrointestinal:  Positive for abdominal pain.  Genitourinary:  Negative for flank pain.  Musculoskeletal:  Negative for back pain.  Skin:  Negative for color  change and rash.  Neurological:  Negative for syncope.  All other systems reviewed and are negative.  Physical Exam Updated Vital Signs BP 126/82   Pulse 78   Temp (!) 97.4 F (36.3 C) (Oral)   Resp 10   Ht 6\' 1"  (1.854 m)   Wt 77.1 kg   SpO2 100%   BMI 22.43 kg/m   Physical Exam Constitutional:      General: He is not in acute distress.    Appearance: He is well-developed.  HENT:     Head: Normocephalic.     Nose: Nose normal.  Eyes:     Extraocular Movements: Extraocular movements intact.  Cardiovascular:     Rate and Rhythm: Normal rate.  Pulmonary:     Effort: Pulmonary effort is normal.  Abdominal:     Tenderness: There is abdominal tenderness.  Skin:    Coloration: Skin is not jaundiced.  Neurological:     Mental Status: He is alert. Mental status is at baseline.    ED Results / Procedures / Treatments   Labs (all labs ordered are listed, but only abnormal results are displayed) Labs Reviewed  CBC WITH DIFFERENTIAL/PLATELET - Abnormal; Notable for the following components:      Result Value   WBC 11.1 (*)    All other components within normal limits  COMPREHENSIVE METABOLIC PANEL - Abnormal; Notable for the following components:   Total Protein 8.5 (*)    All other components within normal limits  LIPASE, BLOOD  URINALYSIS, ROUTINE W REFLEX MICROSCOPIC    EKG None  Radiology CT Abdomen Pelvis W Contrast  Result Date: 01/29/2021 CLINICAL DATA:  Abdominal pain with nausea, vomiting, and diarrhea. EXAM: CT ABDOMEN AND PELVIS WITH CONTRAST TECHNIQUE: Multidetector CT imaging of the abdomen and pelvis was performed using the standard protocol following bolus administration of intravenous contrast. CONTRAST:  01/31/2021 OMNIPAQUE IOHEXOL 300 MG/ML  SOLN COMPARISON:  CT abdomen pelvis dated March 13, 2019. FINDINGS: Lower chest: No acute abnormality. Unchanged 4 mm pulmonary nodule in the peripheral left lower lobe, benign. Hepatobiliary: No focal liver  abnormality is seen. No gallstones, gallbladder wall thickening, or biliary dilatation. Pancreas: Unremarkable. No pancreatic ductal dilatation or surrounding inflammatory changes. Spleen: Normal in size without focal abnormality. Adrenals/Urinary Tract: Adrenal glands are unremarkable. Kidneys are normal, without renal calculi, focal lesion, or hydronephrosis. Bladder is unremarkable. Stomach/Bowel: Distended stomach. Scattered fluid-filled small bowel loops. Wall thickening of the distal sigmoid colon and rectum. Colon is relatively decompressed. No obstruction. Normal appendix. Vascular/Lymphatic: No significant vascular findings are present. No enlarged abdominal or pelvic lymph nodes. Reproductive: Prostate is unremarkable. Other: No free fluid or pneumoperitoneum. Musculoskeletal: No acute or significant osseous findings. IMPRESSION: 1. Scattered fluid-filled small bowel loops and wall thickening of the distal sigmoid colon and rectum, suggestive  of enterocolitis. Electronically Signed   By: Obie Dredge M.D.   On: 01/29/2021 20:43    Procedures Procedures   Medications Ordered in ED Medications  ketorolac (TORADOL) 15 MG/ML injection 15 mg (15 mg Intravenous Given 01/29/21 2018)  ondansetron (ZOFRAN) injection 4 mg (4 mg Intravenous Given 01/29/21 2016)  iohexol (OMNIPAQUE) 300 MG/ML solution 100 mL (100 mLs Intravenous Contrast Given 01/29/21 2033)    ED Course  I have reviewed the triage vital signs and the nursing notes.  Pertinent labs & imaging results that were available during my care of the patient were reviewed by me and considered in my medical decision making (see chart for details).    MDM Rules/Calculators/A&P                          Patient presented with lab values White count 11 hemoglobin 17 chemistry unremarkable. CT abdomen pelvis concerning for enterocolitis, no other acute findings noted. Given Toradol and Zofran IV fluid hydration.  Will recommend outpatient  follow-up with his doctor within the week.  Advising return for worsening symptoms or any additional concerns.  Final Clinical Impression(s) / ED Diagnoses Final diagnoses:  Abdominal pain, unspecified abdominal location  Nausea vomiting and diarrhea    Rx / DC Orders ED Discharge Orders          Ordered    ondansetron (ZOFRAN ODT) 4 MG disintegrating tablet  Every 8 hours PRN        01/29/21 2217             Cheryll Cockayne, MD 01/29/21 2217

## 2021-01-29 NOTE — ED Triage Notes (Signed)
The pt is c/o abdomen pain with n v and diarrhea since 0800a this am

## 2021-01-29 NOTE — ED Provider Notes (Signed)
Emergency Medicine Provider Triage Evaluation Note  [name] , a 35 y.o. male  was evaluated in triage.  Pt complains of abdominal pain.  Pain began this morning at 0 800.  Pain is located throughout his entire abdomen.  Patient endorses nausea, vomiting, and diarrhea.  2 episodes of vomiting since this morning.  Denies any hematemesis or coffee-ground emesis.  Denies any history of abdominal surgeries.  Endorses methamphetamine use, last smoked methamphetamine earlier this morning.  Review of Systems  Positive: Abdominal pain, nausea, vomiting, diarrhea, chills Negative: Fever, blood in stool, dysuria, hematuria,  Physical Exam  BP (!) 148/97   Pulse 86   Temp (!) 97.4 F (36.3 C) (Oral)   Resp 16   Ht 6\' 1"  (1.854 m)   Wt 77.1 kg   SpO2 100%   BMI 22.43 kg/m  Gen:   Awake, no distress   Resp:  Normal effort  MSK:   Moves extremities without difficulty  Other:  Abdomen soft, nondistended, generalized tenderness throughout abdomen.  Negative CVA tenderness bilaterally.  No guarding or rebound tenderness.  Medical Decision Making  Medically screening exam initiated at 4:35 PM.  Appropriate orders placed.  Antuan Limes was informed that the remainder of the evaluation will be completed by another provider, this initial triage assessment does not replace that evaluation, and the importance of remaining in the ED until their evaluation is complete.  The patient appears stable so that the remainder of the work up may be completed by another provider.      Juanda Chance 01/29/21 1636    01/31/21, MD 01/29/21 2214

## 2021-01-29 NOTE — Discharge Instructions (Addendum)
Call your primary care doctor or specialist as discussed in the next 2-3 days.   Return immediately back to the ER if:  Your symptoms worsen within the next 12-24 hours. You develop new symptoms such as new fevers, persistent vomiting, new pain, shortness of breath, or new weakness or numbness, or if you have any other concerns.  

## 2021-02-13 ENCOUNTER — Emergency Department (HOSPITAL_BASED_OUTPATIENT_CLINIC_OR_DEPARTMENT_OTHER)
Admission: EM | Admit: 2021-02-13 | Discharge: 2021-02-13 | Disposition: A | Discharge status: Home / Self Care | Payer: Self-pay | Attending: Emergency Medicine | Admitting: Emergency Medicine

## 2021-02-13 ENCOUNTER — Emergency Department (HOSPITAL_COMMUNITY)
Admission: EM | Admit: 2021-02-13 | Discharge: 2021-02-14 | Disposition: A | Discharge status: Home / Self Care | Payer: Self-pay | Attending: Emergency Medicine | Admitting: Emergency Medicine

## 2021-02-13 ENCOUNTER — Encounter (HOSPITAL_COMMUNITY): Payer: Self-pay | Admitting: Emergency Medicine

## 2021-02-13 ENCOUNTER — Other Ambulatory Visit: Payer: Self-pay

## 2021-02-13 ENCOUNTER — Encounter (HOSPITAL_BASED_OUTPATIENT_CLINIC_OR_DEPARTMENT_OTHER): Payer: Self-pay | Admitting: Emergency Medicine

## 2021-02-13 DIAGNOSIS — F17210 Nicotine dependence, cigarettes, uncomplicated: Secondary | ICD-10-CM | POA: Insufficient documentation

## 2021-02-13 DIAGNOSIS — Z5321 Procedure and treatment not carried out due to patient leaving prior to being seen by health care provider: Secondary | ICD-10-CM | POA: Insufficient documentation

## 2021-02-13 DIAGNOSIS — Z21 Asymptomatic human immunodeficiency virus [HIV] infection status: Secondary | ICD-10-CM | POA: Insufficient documentation

## 2021-02-13 DIAGNOSIS — K6289 Other specified diseases of anus and rectum: Secondary | ICD-10-CM | POA: Insufficient documentation

## 2021-02-13 DIAGNOSIS — L0231 Cutaneous abscess of buttock: Secondary | ICD-10-CM | POA: Insufficient documentation

## 2021-02-13 DIAGNOSIS — L0291 Cutaneous abscess, unspecified: Secondary | ICD-10-CM

## 2021-02-13 MED ORDER — SULFAMETHOXAZOLE-TRIMETHOPRIM 800-160 MG PO TABS
1.0000 | ORAL_TABLET | Freq: Once | ORAL | Status: AC
  Administered 2021-02-14: 1 via ORAL
  Filled 2021-02-13: qty 1

## 2021-02-13 MED ORDER — SULFAMETHOXAZOLE-TRIMETHOPRIM 800-160 MG PO TABS: 1.0000 | ORAL_TABLET | Freq: Two times a day (BID) | ORAL | 0 refills | Status: AC

## 2021-02-13 NOTE — ED Triage Notes (Signed)
Pt presents to ED POV. Pt c/o bump in rectal area x1w. Very painful. Denies fever/chills

## 2021-02-13 NOTE — ED Provider Notes (Signed)
[facility]-EMERGENCY DEPT Provider Note   CSN: [id number] Arrival date & time: 02/13/21  2309     History Chief Complaint  Patient presents with   Abscess    [name] is a 35 y.o. male.  The history is provided by the patient.  Abscess Location:  Pelvis Pelvic abscess location:  L buttock Abscess quality: draining and induration   Red streaking: no   Duration:  1 week Progression:  Worsening Chronicity:  New Context: not diabetes and not injected drug use   Relieved by:  Nothing Worsened by:  Nothing Ineffective treatments:  None tried Associated symptoms: no fever and no vomiting   Risk factors: no family hx of MRSA       Past Medical History:  Diagnosis Date   ADHD (attention deficit hyperactivity disorder)    Chronic hepatitis C without hepatic coma (HCC) 05/12/2020   Drug abuse (HCC)    meth, marijuana   HIV positive (HCC)     Patient Active Problem List   Diagnosis Date Noted   Closed fracture of body of mandible (HCC)    Syncope and collapse    Fall 10/15/2020   Chronic hepatitis C without hepatic coma (HCC) 05/12/2020   Methamphetamine intoxication (HCC) 12/20/2019   AKI (acute kidney injury) (HCC) 12/20/2019   Rhabdomyolysis 12/20/2019   Elevated transaminase level 12/20/2019   Elevated CK    Methamphetamine use disorder, severe, dependence (HCC) 07/10/2019   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   MDD (major depressive disorder), recurrent severe, 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   HIV (human immunodeficiency virus infection) (HCC) 06/05/2019    Past Surgical History:  Procedure Laterality Date   ORIF MANDIBULAR FRACTURE Right 10/16/2020   Procedure: OPEN REDUCTION INTERNAL FIXATION (ORIF) SUBCONDYLAR FRACTURE WITH REPAIR OF 3CM CHIN LACERATION;  Surgeon: Lovena Neighbours, MD;  Location: [facility] OR;  Service: Oral Surgery;  Laterality: Right;       Family History  Problem Relation Age of Onset    Diabetes Mother    Hypertension Mother    Cancer Mother    Cancer Father    Diabetes Father    Hypertension Father     Social History   Tobacco Use   Smoking status: Every Day    Packs/day: 1.00    Years: 10.00    Pack years: 10.00    Types: Cigarettes   Smokeless tobacco: Never  Vaping Use   Vaping Use: Never used  Substance Use Topics   Alcohol use: Not Currently   Drug use: Yes    Types: Methamphetamines    Comment: last used yesterday    Home Medications Prior to Admission medications   Medication Sig Start Date End Date Taking? Authorizing Provider  acetaminophen (TYLENOL) 500 MG tablet Take 500-1,000 mg by mouth every 6 (six) hours as needed for mild pain.    [provider]  clindamycin (CLEOCIN) 300 MG capsule TAKE 2 CAPSULES (600 MG TOTAL) BY MOUTH 3 (THREE) TIMES DAILY FOR 7 DAYS. 10/16/20 10/16/21  Simmons-Robinson, Tawanna Cooler, MD  DOVATO 50-300 MG TABS TAKE 1 TABLET BY MOUTH DAILY 11/09/20   Daiva Eves, Lisette Grinder, MD  ibuprofen (ADVIL) 200 MG tablet Take 600 mg by mouth every 6 (six) hours as needed for mild pain.    [provider]  naloxone Jonelle Sports) 2 MG/2ML injection For suspected opioid overdose, spray 1 mL in each nostril.  Repeat after 3 minutes if no or minimal response. 12/24/19   [provider]  ondansetron (  ZOFRAN ODT) 4 MG disintegrating tablet Take 1 tablet (4 mg total) by mouth every 8 (eight) hours as needed for up to 10 doses for nausea or vomiting. 01/29/21   Cheryll Cockayne, MD  omeprazole (PRILOSEC) 20 MG capsule Take 1 capsule (20 mg total) by mouth daily. Patient not taking: Reported on 02/14/2019 06/24/18 02/14/19  Roxy Horseman, PA-C    Allergies    Patient has no known allergies.  Review of Systems   Review of Systems  Constitutional:  Negative for fever.  HENT:  Negative for facial swelling.   Eyes:  Negative for redness.  Respiratory:  Negative for wheezing.   Cardiovascular:  Negative for leg swelling.   Gastrointestinal:  Negative for vomiting.  Genitourinary:  Negative for difficulty urinating.  Musculoskeletal:  Negative for neck stiffness.  Skin:  Positive for wound. Negative for color change.  Neurological:  Negative for facial asymmetry.  Psychiatric/Behavioral:  Negative for agitation.   All other systems reviewed and are negative.  Physical Exam Updated Vital Signs BP 125/87 (BP Location: Right Arm)   Pulse 97   Temp 98 F (36.7 C) (Oral)   Resp 13   Ht 6\' 1"  (1.854 m)   Wt 77.1 kg   SpO2 98%   BMI 22.43 kg/m   Physical Exam Vitals and nursing note reviewed. Exam conducted with a chaperone present ).  Constitutional:      General: He is not in acute distress.    Appearance: Normal appearance.  HENT:     Head: Normocephalic and atraumatic.     Nose: Nose normal.  Eyes:     Conjunctiva/sclera: Conjunctivae normal.     Pupils: Pupils are equal, round, and reactive to light.  Cardiovascular:     Rate and Rhythm: Normal rate and regular rhythm.     Pulses: Normal pulses.     Heart sounds: Normal heart sounds.  Pulmonary:     Effort: Pulmonary effort is normal.     Breath sounds: Normal breath sounds.  Abdominal:     General: Abdomen is flat. Bowel sounds are normal.     Palpations: Abdomen is soft.     Tenderness: There is no abdominal tenderness. There is no guarding.  Musculoskeletal:        General: Normal range of motion.     Cervical back: Normal range of motion and neck supple.  Skin:      Neurological:     Mental Status: He is alert.  Psychiatric:        Mood and Affect: Mood normal.        Behavior: Behavior normal.    ED Results / Procedures / Treatments   Labs (all labs ordered are listed, but only abnormal results are displayed) Labs Reviewed - No data to display  EKG None  Radiology No results found.  Procedures Procedures   Medications Ordered in ED Medications - No data to display  ED Course  I have reviewed the  triage vital signs and the nursing notes.  Pertinent labs & imaging results that were available during my care of the patient were reviewed by me and considered in my medical decision making (see chart for details).   Already dehisced abscess.  Nothing to drain at this time.  Will start Bactrim DS and sitz baths.  Return for any new or concerning symptoms.    Norvel Wenker was evaluated in Emergency Department on 02/13/2021 for the symptoms described in the history of present illness. He was  evaluated in the context of the global COVID-19 pandemic, which necessitated consideration that the patient might be at risk for infection with the SARS-CoV-2 virus that causes COVID-19. Institutional protocols and algorithms that pertain to the evaluation of patients at risk for COVID-19 are in a state of rapid change based on information released by regulatory bodies including the CDC and federal and state organizations. These policies and algorithms were followed during the patient's care in the ED.  Final Clinical Impression(s) / ED Diagnoses Final diagnoses:  None    Return for intractable cough, coughing up blood, fevers > 100.4 unrelieved by medication, shortness of breath, intractable vomiting, chest pain, shortness of breath, weakness, numbness, changes in speech, facial asymmetry, abdominal pain, passing out, Inability to tolerate liquids or food, cough, altered mental status or any concerns. No signs of systemic illness or infection. The patient is nontoxic-appearing on exam and vital signs are within normal limits. I have reviewed the triage vital signs and the nursing notes. Pertinent labs & imaging results that were available during my care of the patient were reviewed by me and considered in my medical decision making (see chart for details). After history, exam, and medical workup I feel the patient has been appropriately medically screened and is safe for discharge home. Pertinent diagnoses were  discussed with the patient. Patient was given return precautions Rx / DC Orders ED Discharge Orders     None        Micheala Morissette, MD 02/13/21 2358

## 2021-02-13 NOTE — ED Triage Notes (Signed)
Patient left [facility] to come to [facility]. Patient complaining of abscess on left buttocks.

## 2021-02-16 DIAGNOSIS — L0231 Cutaneous abscess of buttock: Secondary | ICD-10-CM | POA: Insufficient documentation

## 2021-02-16 DIAGNOSIS — Z5321 Procedure and treatment not carried out due to patient leaving prior to being seen by health care provider: Secondary | ICD-10-CM | POA: Insufficient documentation

## 2021-02-17 ENCOUNTER — Encounter (HOSPITAL_COMMUNITY): Payer: Self-pay

## 2021-02-17 ENCOUNTER — Other Ambulatory Visit: Payer: Self-pay

## 2021-02-17 ENCOUNTER — Emergency Department (HOSPITAL_COMMUNITY)
Admission: EM | Admit: 2021-02-17 | Discharge: 2021-02-17 | Disposition: A | Discharge status: Home / Self Care | Payer: Self-pay | Attending: Emergency Medicine | Admitting: Emergency Medicine

## 2021-02-17 NOTE — ED Triage Notes (Signed)
Pt has an abscess to his left buttock x 1 week.

## 2021-03-01 ENCOUNTER — Ambulatory Visit: Payer: Self-pay

## 2021-03-02 ENCOUNTER — Ambulatory Visit: Payer: Self-pay

## 2021-03-02 ENCOUNTER — Ambulatory Visit: Payer: Self-pay | Admitting: Pharmacist

## 2021-03-10 ENCOUNTER — Emergency Department (HOSPITAL_COMMUNITY)
Admission: EM | Admit: 2021-03-10 | Discharge: 2021-03-10 | Disposition: A | Discharge status: Home / Self Care | Payer: Self-pay | Attending: Emergency Medicine | Admitting: Emergency Medicine

## 2021-03-10 ENCOUNTER — Encounter (HOSPITAL_COMMUNITY): Payer: Self-pay

## 2021-03-10 ENCOUNTER — Other Ambulatory Visit: Payer: Self-pay

## 2021-03-10 DIAGNOSIS — F17210 Nicotine dependence, cigarettes, uncomplicated: Secondary | ICD-10-CM | POA: Insufficient documentation

## 2021-03-10 DIAGNOSIS — L089 Local infection of the skin and subcutaneous tissue, unspecified: Secondary | ICD-10-CM | POA: Insufficient documentation

## 2021-03-10 DIAGNOSIS — L02211 Cutaneous abscess of abdominal wall: Secondary | ICD-10-CM | POA: Insufficient documentation

## 2021-03-10 DIAGNOSIS — X58XXXA Exposure to other specified factors, initial encounter: Secondary | ICD-10-CM | POA: Insufficient documentation

## 2021-03-10 DIAGNOSIS — T148XXA Other injury of unspecified body region, initial encounter: Secondary | ICD-10-CM | POA: Insufficient documentation

## 2021-03-10 DIAGNOSIS — Z21 Asymptomatic human immunodeficiency virus [HIV] infection status: Secondary | ICD-10-CM | POA: Insufficient documentation

## 2021-03-10 MED ORDER — SULFAMETHOXAZOLE-TRIMETHOPRIM 800-160 MG PO TABS
1.0000 | ORAL_TABLET | Freq: Once | ORAL | Status: AC
  Administered 2021-03-10: 1 via ORAL
  Filled 2021-03-10: qty 1

## 2021-03-10 MED ORDER — SULFAMETHOXAZOLE-TRIMETHOPRIM 800-160 MG PO TABS: 1.0000 | ORAL_TABLET | Freq: Two times a day (BID) | ORAL | 0 refills | Status: AC

## 2021-03-10 NOTE — ED Notes (Signed)
Pt's abscess dressed with 4x4 and pt given a ham sandwich prior to d/c.

## 2021-03-10 NOTE — ED Notes (Signed)
Pt ambulatory in triage. 

## 2021-03-10 NOTE — ED Provider Notes (Signed)
[facility]-EMERGENCY DEPT [facility] Emergency Department Provider Note MRN:  [id number]  Arrival date & time: 03/10/21     Chief Complaint   Abscess   History of Present Illness   [name] is a 35 y.o. year-old male with a history of IV drug use, HIV presenting to the ED with chief complaint of abscess.  Location: Left lower quadrant abdomen Duration: 1 to 2 weeks Onset: Gradual Timing: Constant lesion or pain Description: Red, tender Severity: Mild to moderate Exacerbating/Alleviating Factors: Worse with motion and palpation Associated Symptoms: None Pertinent Negatives: Denies fever, no chest pain or shortness of breath, no abdominal pain  Additional History: Wound started draining about a week ago  Review of Systems  A complete 10 system review of systems was obtained and all systems are negative except as noted in the HPI and PMH.   Patient's Health History    Past Medical History:  Diagnosis Date   ADHD (attention deficit hyperactivity disorder)    Chronic hepatitis C without hepatic coma (HCC) 05/12/2020   Drug abuse (HCC)    meth, marijuana   HIV positive (HCC)     Past Surgical History:  Procedure Laterality Date   ORIF MANDIBULAR FRACTURE Right 10/16/2020   Procedure: OPEN REDUCTION INTERNAL FIXATION (ORIF) SUBCONDYLAR FRACTURE WITH REPAIR OF 3CM CHIN LACERATION;  Surgeon: Lovena Neighbours, MD;  Location: [facility] OR;  Service: Oral Surgery;  Laterality: Right;    Family History  Problem Relation Age of Onset   Diabetes Mother    Hypertension Mother    Cancer Mother    Cancer Father    Diabetes Father    Hypertension Father     Social History   Socioeconomic History   Marital status: Single    Spouse name: Not on file   Number of children: Not on file   Years of education: Not on file   Highest education level: Not on file  Occupational History   Not on file  Tobacco Use   Smoking status: Every Day    Packs/day: 1.00    Years: 10.00    Pack  years: 10.00    Types: Cigarettes   Smokeless tobacco: Never  Vaping Use   Vaping Use: Never used  Substance and Sexual Activity   Alcohol use: Not Currently   Drug use: Yes    Types: Methamphetamines    Comment: last used yesterday   Sexual activity: Yes    Birth control/protection: None    Comment: offered condoms  Other Topics Concern   Not on file  Social History Narrative   Not on file   Social Determinants of Health   Financial Resource Strain: Not on file  Food Insecurity: Not on file  Transportation Needs: Not on file  Physical Activity: Not on file  Stress: Not on file  Social Connections: Not on file  Intimate Partner Violence: Not on file     Physical Exam   Vitals:   03/10/21 0309  BP: 115/84  Pulse: 90  Resp: 18  Temp: 97.8 F (36.6 C)  SpO2: 100%    CONSTITUTIONAL: Well-appearing, NAD NEURO:  Alert and oriented x 3, no focal deficits EYES:  eyes equal and reactive ENT/NECK:  no LAD, no JVD CARDIO: Regular rate, well-perfused, normal S1 and S2 PULM:  CTAB no wheezing or rhonchi GI/GU:  normal bowel sounds, non-distended, non-tender MSK/SPINE:  No gross deformities, no edema SKIN: Circular wound to the left lower quadrant abdominal wall, active purulent drainage PSYCH:  Appropriate  speech and behavior  *Additional and/or pertinent findings included in MDM below  Diagnostic and Interventional Summary    EKG Interpretation  Date/Time:    Ventricular Rate:    PR Interval:    QRS Duration:   QT Interval:    QTC Calculation:   R Axis:     Text Interpretation:         Labs Reviewed - No data to display  No orders to display    Medications  sulfamethoxazole-trimethoprim (BACTRIM DS) 800-160 MG per tablet 1 tablet (has no administration in time range)     Procedures  /  Critical Care Procedures  ED Course and Medical Decision Making  I have reviewed the triage vital signs, the nursing notes, and pertinent available records from the  EMR.  Listed above are laboratory and imaging tests that I personally ordered, reviewed, and interpreted and then considered in my medical decision making (see below for details).  Wound infection, there is no fluctuance to suggest any further retained abscess or need for I&D.  Offered to anesthetize the area and further evaluate and scrubbed the wound to ensure there is no pocket of purulent material that needs to be expressed.  Patient declines this.  Prefers antibiotic treatment and warm compresses at home.  Normal vital signs here, no fever, no systemic symptoms, appropriate for discharge.  Strict return precautions given patient's history of HIV, IV drug use.       Elmer Sow. Pilar Plate, MD [facility] Emergency Medicine [facility] [email or web address]  Final Clinical Impressions(s) / ED Diagnoses     ICD-10-CM   1. Wound infection  T14.8XXA    L08.9       ED Discharge Orders          Ordered    sulfamethoxazole-trimethoprim (BACTRIM DS) 800-160 MG tablet  2 times daily        03/10/21 0520             Discharge Instructions Discussed with and Provided to Patient:    Discharge Instructions      You were evaluated in the Emergency Department and after careful evaluation, we did not find any emergent condition requiring admission or further testing in the hospital.  Your exam/testing today was overall reassuring.  Please take the Bactrim antibiotic to help this wound heal.  As discussed, use warm soapy water and try to wash the area a few times per day.  Please return to the Emergency Department if you experience any worsening of your condition.  Thank you for allowing [place] to be a part of your care.        Sabas Sous, MD 03/10/21 [id number]

## 2021-03-10 NOTE — Discharge Instructions (Addendum)
You were evaluated in the Emergency Department and after careful evaluation, we did not find any emergent condition requiring admission or further testing in the hospital.  Your exam/testing today was overall reassuring.  Please take the Bactrim antibiotic to help this wound heal.  As discussed, use warm soapy water and try to wash the area a few times per day.  Please return to the Emergency Department if you experience any worsening of your condition.  Thank you for allowing [place] to be a part of your care.

## 2021-03-10 NOTE — ED Triage Notes (Signed)
Pt reports having a large abscess on his left hip x 1 week. Pt states that it has white tip on it.

## 2021-03-16 ENCOUNTER — Emergency Department (HOSPITAL_COMMUNITY)
Admission: EM | Admit: 2021-03-16 | Discharge: 2021-03-16 | Disposition: A | Discharge status: Home / Self Care | Payer: Self-pay | Attending: Emergency Medicine | Admitting: Emergency Medicine

## 2021-03-16 ENCOUNTER — Other Ambulatory Visit (HOSPITAL_COMMUNITY): Payer: Self-pay

## 2021-03-16 ENCOUNTER — Other Ambulatory Visit: Payer: Self-pay

## 2021-03-16 ENCOUNTER — Encounter (HOSPITAL_COMMUNITY): Payer: Self-pay | Admitting: *Deleted

## 2021-03-16 ENCOUNTER — Ambulatory Visit: Payer: Self-pay

## 2021-03-16 ENCOUNTER — Encounter: Payer: Self-pay | Admitting: Infectious Diseases

## 2021-03-16 ENCOUNTER — Ambulatory Visit (INDEPENDENT_AMBULATORY_CARE_PROVIDER_SITE_OTHER): Payer: Self-pay | Admitting: Pharmacist

## 2021-03-16 DIAGNOSIS — Z113 Encounter for screening for infections with a predominantly sexual mode of transmission: Secondary | ICD-10-CM

## 2021-03-16 DIAGNOSIS — B182 Chronic viral hepatitis C: Secondary | ICD-10-CM

## 2021-03-16 DIAGNOSIS — Z5321 Procedure and treatment not carried out due to patient leaving prior to being seen by health care provider: Secondary | ICD-10-CM | POA: Insufficient documentation

## 2021-03-16 DIAGNOSIS — B20 Human immunodeficiency virus [HIV] disease: Secondary | ICD-10-CM

## 2021-03-16 DIAGNOSIS — L02416 Cutaneous abscess of left lower limb: Secondary | ICD-10-CM | POA: Insufficient documentation

## 2021-03-16 DIAGNOSIS — Z79899 Other long term (current) drug therapy: Secondary | ICD-10-CM

## 2021-03-16 MED ORDER — BIKTARVY 50-200-25 MG PO TABS: 1.0000 | ORAL_TABLET | Freq: Every day | ORAL | 2 refills | Status: DC

## 2021-03-16 NOTE — Progress Notes (Signed)
03/16/2021  HPI: [name] is a 35 y.o. male who presents to the [facility] pharmacy clinic for HIV follow-up.  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   CLINDAMYCIN (CLEOCIN) 300 MG CAPSULE    TAKE 2 CAPSULES (600 MG TOTAL) BY MOUTH 3 (THREE) TIMES DAILY FOR 7 DAYS.   DOVATO 50-300 MG TABS    TAKE 1 TABLET BY MOUTH DAILY   IBUPROFEN (ADVIL) 200 MG TABLET    Take 600 mg by mouth every 6 (six) hours as needed for mild pain.   NALOXONE (NARCAN) 2 MG/2ML INJECTION    For suspected opioid overdose, spray 1 mL in each nostril.  Repeat after 3 minutes if no or minimal response.   ONDANSETRON (ZOFRAN ODT) 4 MG DISINTEGRATING TABLET    Take 1 tablet (4 mg total) by mouth every 8 (eight) hours as needed for up to 10 doses for nausea or vomiting.   SULFAMETHOXAZOLE-TRIMETHOPRIM (BACTRIM DS) 800-160 MG TABLET    Take 1 tablet by mouth 2 (two) times daily for 7 days.  Modified Medications   No medications on file  Discontinued Medications   No medications on file    Allergies: No Known Allergies  Past Medical History: Past Medical History:  Diagnosis Date   ADHD (attention deficit  hyperactivity disorder)    Chronic hepatitis C without hepatic coma (HCC) 05/12/2020   Drug abuse (HCC)    meth, marijuana   HIV positive (HCC)     Social History: Social History   Socioeconomic History   Marital status: Single    Spouse name: Not on file   Number of children: Not on file   Years of education: Not on file   Highest education level: Not on file  Occupational History   Not on file  Tobacco Use   Smoking status: Every Day    Packs/day: 1.00    Years: 10.00    Pack years: 10.00    Types: Cigarettes   Smokeless tobacco: Never  Vaping Use   Vaping Use: Never used  Substance and Sexual Activity   Alcohol use: Not Currently   Drug use: Yes    Types: Methamphetamines    Comment: last used yesterday   Sexual activity: Yes    Birth control/protection: None    Comment: offered condoms  Other Topics Concern   Not on file  Social History Narrative   Not on file   Social Determinants of Health   Financial Resource Strain: Not on file  Food Insecurity: Not on file  Transportation Needs: Not on file  Physical Activity: Not on file  Stress: Not on file  Social Connections: Not on file    Labs:  Lab Results  Component Value Date   HIV1RNAQUANT 371 (H) 05/12/2020   HIV1RNAQUANT 292,000 (H) 01/13/2020   HIV1RNAQUANT 248,000 (H) 10/09/2019   CD4TABS 569 05/12/2020   CD4TABS 403 01/13/2020   CD4TABS 606 10/09/2019    RPR and STI Lab Results  Component Value Date   LABRPR Reactive (A) 10/15/2020   LABRPR Reactive (A) 07/30/2020   LABRPR REACTIVE (A) 05/12/2020   LABRPR REACTIVE (A) 01/13/2020   LABRPR REACTIVE (A) 10/09/2019   RPRTITER 1:2 (H) 05/12/2020   RPRTITER 1:2 (H) 01/13/2020   RPRTITER 1:4 (H) 10/09/2019   RPRTITER 1:32 (A) 01/26/2014    STI Results GC GC CT CT  Latest Ref Rng & Units - NEGATIVE - NEGATIVE  10/15/2020 Negative - Negative -  05/12/2020 Negative - Negative -  01/13/2020 Negative - Negative -  01/13/2020 Negative -  Positive(A) -  01/13/2020 Negative - Negative -  10/09/2019 Negative - Negative -  02/24/2019 Negative - Negative -  06/06/2018 Negative - Negative -  10/14/2017 Negative - Negative -  01/26/2014 - NEGATIVE - NEGATIVE  07/23/2010 - - NEGATIVE (NOTE)  Testing performed using the BD ProbeTec Qx Chlamydia trachomatis and Neisseria gonorrhea amplified DNA assay.  Performed at:  [facility]               [address]                [place], [place] [address]               [id number] -  12/09/2009 - - NEGATIVE (NOTE)  Testing performed using the BD Probetec ET Chlamydia trachomatis and Neisseria gonorrhea amplified DNA assay. -  03/08/2007 - - NEGATIVE (NOTE)  Testing performed using the BD Probetec ET Chlamydia trachomatis and Neisseria gonorrhea amplified DNA assay. -    Hepatitis B Lab Results  Component Value Date   HEPBSAB REACTIVE (A) 10/09/2019   HEPBSAG NON-REACTIVE 10/09/2019   HEPBCAB NON-REACTIVE 10/09/2019   Hepatitis C Lab Results  Component Value Date   HEPCAB REACTIVE (A) 10/09/2019   HCVRNAPCRQN 1,670,000 (H) 05/12/2020   Hepatitis A Lab Results  Component Value Date   HAV NON-REACTIVE 10/09/2019   Lipids: Lab Results  Component Value Date   CHOL 115 10/09/2019   TRIG 103 10/09/2019   HDL 33 (L) 10/09/2019   CHOLHDL 3.5 10/09/2019   VLDL 31 07/10/2019   LDLCALC 63 10/09/2019    Current HIV Regimen: Dovato - not taking  Assessment: Manus is here today to follow up for his HIV infection after several missed appointments with our clinic. He was last seen by Dr. Daiva Eves on 05/12/20. He was started on Dovato but has not taken it in at least 6 months. He thinks it made him SOB, so he stopped after a few doses. He does not wish to take it anymore. He is also requesting another provider besides Dr. Daiva Eves.   He also has chronic Hepatitis C from IV drug abuse. He is trying to get accepted into rehab and may start  next week. He states that he smokes and injects methamphetamine every day. He has also injected heroin a few times and recently snorted Fentanyl. He has chronic jaw pain from abscesses that he needs to see a dentist for and states that the meth numbs his pain. I told him that we could try and  get him into our dental clinic here.   He wants to restart a medication for his HIV. He is also interested in treating his Hepatitis C. I explained that Hep C is curable with a medication for 3 months, but that we would not move forward with treatment until he is more stable and is able to take a medication every day since adherence is of the upmost importance with Hepatitis C treatment (and HIV too). He believes he is motivated to try.   Will start Biktarvy today. Explained that Susanne Borders is a one pill once daily medication with or without food and the importance of not missing any doses. Explained resistance and how it develops and why it is so important to take Biktarvy daily and not skip days or doses. Counseled patient to take it around the same time each day. Counseled on what to do if dose is missed, if closer to missed dose take immediately, if closer to next dose then skip and resume normal schedule. Cautioned on possible side effects the first week or so including nausea, diarrhea, dizziness, and headaches but that they should resolve after the first couple of weeks. I reviewed patient medications and found no drug interactions. Counseled patient to separate Biktarvy from divalent cations including multivitamins.  Gave him samples of Biktarvy today while his Lenward Chancellor is renewed. Also gave him some ibuprofen as well. Will check labs and have him see Judeth Cornfield in ~6 weeks if he is not in rehab.   Plan: - Start Biktarvy, samples provided - HIV RNA, CD4, CBC, CMET, RPR, Hep C RNA today - F/u with Stephanie in ~6-8 weeks  Ovie Cornelio L. Keyvin Rison, PharmD, BCIDP, AAHIVP, CPP Clinical Pharmacist  Practitioner Infectious Diseases Clinical Pharmacist [facility] 03/16/2021, 9:38 AM

## 2021-03-16 NOTE — ED Triage Notes (Signed)
Pt complains of abscess to left hip for the past few weeks. It began draining 1 week ago.

## 2021-03-17 ENCOUNTER — Telehealth: Payer: Self-pay | Admitting: Pharmacist

## 2021-03-17 LAB — T-HELPER CELL (CD4) - (RCID CLINIC ONLY)
CD4 % Helper T Cell: 36 % (ref 33–65)
CD4 T Cell Abs: 600 /uL (ref 400–1790)

## 2021-03-17 NOTE — Telephone Encounter (Signed)
Patient's RPR is elevated at 1:8. Last RPR is from March 2022 and was 1:1. Called patient to schedule for Bicillin treatment. No answer, left HIPAA compliant VM.

## 2021-03-18 ENCOUNTER — Telehealth: Payer: Self-pay | Admitting: Pharmacist

## 2021-03-18 NOTE — Telephone Encounter (Signed)
Called patient again. No answer, left VM.

## 2021-03-18 NOTE — Telephone Encounter (Signed)
Referral faxed to [facility] for RPR treatment.   Sandie Ano, RN

## 2021-03-18 NOTE — Telephone Encounter (Signed)
Called patient to schedule for Bicillin, no answer.  Sandie Ano, RN

## 2021-03-18 NOTE — Telephone Encounter (Signed)
Thanks Megan!

## 2021-03-19 ENCOUNTER — Other Ambulatory Visit: Payer: Self-pay | Admitting: Pharmacist

## 2021-03-19 DIAGNOSIS — B20 Human immunodeficiency virus [HIV] disease: Secondary | ICD-10-CM

## 2021-03-19 MED ORDER — BICTEGRAVIR-EMTRICITAB-TENOFOV 50-200-25 MG PO TABS: 1.0000 | ORAL_TABLET | Freq: Every day | ORAL | 0 refills | Status: AC

## 2021-03-19 NOTE — Progress Notes (Signed)
Medication Samples have been provided to the patient.  Drug name: Biktarvy        Strength: 50/200/25 mg       Qty: 28 tablets (4 bottles) LOT: CHSYVB   Exp.Date: 6/24  Dosing instructions: Take one tablet by mouth once daily  The patient has been instructed regarding the correct time, dose, and frequency of taking this medication, including desired effects and most common side effects.   Margarite Gouge, PharmD, CPP Clinical Pharmacist Practitioner Infectious Diseases Clinical Pharmacist [facility]  06/29/2020, 10:07 AM

## 2021-03-26 LAB — HIV-1 INTEGRASE GENOTYPE

## 2021-03-26 LAB — COMPREHENSIVE METABOLIC PANEL
AG Ratio: 1.2 (calc) (ref 1.0–2.5)
ALT: 46 U/L (ref 9–46)
AST: 48 U/L — ABNORMAL HIGH (ref 10–40)
Albumin: 4.5 g/dL (ref 3.6–5.1)
Alkaline phosphatase (APISO): 74 U/L (ref 36–130)
BUN/Creatinine Ratio: 27 (calc) — ABNORMAL HIGH (ref 6–22)
BUN: 35 mg/dL — ABNORMAL HIGH (ref 7–25)
CO2: 30 mmol/L (ref 20–32)
Calcium: 9.5 mg/dL (ref 8.6–10.3)
Chloride: 94 mmol/L — ABNORMAL LOW (ref 98–110)
Creat: 1.29 mg/dL — ABNORMAL HIGH (ref 0.60–1.26)
Globulin: 3.7 g/dL (calc) (ref 1.9–3.7)
Glucose, Bld: 90 mg/dL (ref 65–99)
Potassium: 3.9 mmol/L (ref 3.5–5.3)
Sodium: 134 mmol/L — ABNORMAL LOW (ref 135–146)
Total Bilirubin: 0.5 mg/dL (ref 0.2–1.2)
Total Protein: 8.2 g/dL — ABNORMAL HIGH (ref 6.1–8.1)

## 2021-03-26 LAB — HIV-1 GENOTYPE: HIV-1 Genotype: DETECTED — AB

## 2021-03-26 LAB — RPR TITER: RPR Titer: 1:8 {titer} — ABNORMAL HIGH

## 2021-03-26 LAB — CBC
HCT: 46.8 % (ref 38.5–50.0)
Hemoglobin: 16 g/dL (ref 13.2–17.1)
MCH: 30 pg (ref 27.0–33.0)
MCHC: 34.2 g/dL (ref 32.0–36.0)
MCV: 87.6 fL (ref 80.0–100.0)
MPV: 9.4 fL (ref 7.5–12.5)
Platelets: 283 10*3/uL (ref 140–400)
RBC: 5.34 10*6/uL (ref 4.20–5.80)
RDW: 13.6 % (ref 11.0–15.0)
WBC: 5.4 10*3/uL (ref 3.8–10.8)

## 2021-03-26 LAB — HIV RNA, RTPCR W/R GT (RTI, PI,INT)
HIV 1 RNA Quant: 94200 copies/mL — ABNORMAL HIGH
HIV-1 RNA Quant, Log: 4.97 Log copies/mL — ABNORMAL HIGH

## 2021-03-26 LAB — HEPATITIS C RNA QUANTITATIVE
HCV Quantitative Log: 6.26 log IU/mL — ABNORMAL HIGH
HCV RNA, PCR, QN: 1810000 IU/mL — ABNORMAL HIGH

## 2021-03-26 LAB — FLUORESCENT TREPONEMAL AB(FTA)-IGG-BLD: Fluorescent Treponemal ABS: REACTIVE — AB

## 2021-03-26 LAB — RPR: RPR Ser Ql: REACTIVE — AB

## 2021-05-03 ENCOUNTER — Other Ambulatory Visit: Payer: Self-pay

## 2021-05-03 ENCOUNTER — Ambulatory Visit (INDEPENDENT_AMBULATORY_CARE_PROVIDER_SITE_OTHER): Payer: Self-pay | Admitting: Infectious Diseases

## 2021-05-03 VITALS — BP 116/78 | HR 94 | Resp 16 | Ht 73.0 in | Wt 177.6 lb

## 2021-05-03 DIAGNOSIS — A539 Syphilis, unspecified: Secondary | ICD-10-CM

## 2021-05-03 DIAGNOSIS — B20 Human immunodeficiency virus [HIV] disease: Secondary | ICD-10-CM

## 2021-05-03 DIAGNOSIS — Z5900 Homelessness unspecified: Secondary | ICD-10-CM

## 2021-05-03 DIAGNOSIS — B182 Chronic viral hepatitis C: Secondary | ICD-10-CM

## 2021-05-03 DIAGNOSIS — F1520 Other stimulant dependence, uncomplicated: Secondary | ICD-10-CM

## 2021-05-03 NOTE — Progress Notes (Signed)
Name: [name]  DOB: Jul 21, 1985 MRN: [id number] PCP: Patient, No Pcp Per (Inactive)     Brief Narrative:  [name] is a 35 y.o. male with HIV diagnosed in Nov 2020.  CD4 nadir > 450 HIV Risk: sexual, injection drug use History of OIs: none Intake Labs: Hep B sAg (-), sAb (+), cAb (-); Hep A (-), Hep C (+, VL > 1 million 02/2021) Quantiferon (-) HLA B*5701 () G6PD: ()   Previous Regimens: Dovato - hated it, made him feel like he cold not breathe Biktarvy  Genotypes: 02/2021 : wildtype  Subjective:  CC: HIV follow up care  Needs resources for dental/housing.     HPI: Here today for HIV follow up care. [facility] 3 months ago and started on [place].   He feels like he had trouble breathing with Dovato. He does not feel like that was helpful for him at all. Feels that Artemus is better. Not sure how long exactly he has been taking this but maybe 4 weeks now. "It's all a blurr". Feels that it is the right pill though because he has no side effects to it.   Biggest barrier for his care seems to be lack of housing stability. He is facing criminal charges also and currently evading police. Not clear if he will be going to jail or rehab or continue current state.     Depression screen PHQ 2/9 05/12/2020  Decreased Interest 0  Down, Depressed, Hopeless 0  PHQ - 2 Score 0    Review of Systems  Constitutional:  Negative for chills and fever.  HENT:  Negative for tinnitus.   Eyes:  Negative for blurred vision and photophobia.  Respiratory:  Negative for cough and sputum production.   Cardiovascular:  Negative for chest pain.  Gastrointestinal:  Negative for diarrhea, nausea and vomiting.  Genitourinary:  Negative for dysuria.  Skin:  Negative for rash.  Neurological:  Negative for headaches.  Psychiatric/Behavioral:  Positive for substance abuse.    Past Medical History:  Diagnosis Date   ADHD (attention deficit hyperactivity disorder)    Chronic  hepatitis C without hepatic coma ([place]) 05/12/2020   Drug abuse (HCC)    meth, marijuana   HIV positive ([place])     Outpatient Medications Prior to Visit  Medication Sig Dispense Refill   acetaminophen (TYLENOL) 500 MG tablet Take 500-1,000 mg by mouth every 6 (six) hours as needed for mild pain.     bictegravir-emtricitabine-tenofovir AF (BIKTARVY) 50-200-25 MG TABS tablet Take 1 tablet by mouth daily. 30 tablet 2   ibuprofen (ADVIL) 200 MG tablet Take 600 mg by mouth every 6 (six) hours as needed for mild pain.     clindamycin (CLEOCIN) 300 MG capsule TAKE 2 CAPSULES (600 MG TOTAL) BY MOUTH 3 (THREE) TIMES DAILY FOR 7 DAYS. (Patient not taking: Reported on 05/03/2021) 42 capsule 0   naloxone (NARCAN) 2 MG/2ML injection For suspected opioid overdose, spray 1 mL in each nostril.  Repeat after 3 minutes if no or minimal response.     ondansetron (ZOFRAN ODT) 4 MG disintegrating tablet Take 1 tablet (4 mg total) by mouth every 8 (eight) hours as needed for up to 10 doses for nausea or vomiting. 10 tablet 0   No facility-administered medications prior to visit.     No Known Allergies  Social History   Tobacco Use   Smoking status: Every Day    Packs/day: 1.00    Years: 10.00  Pack years: 10.00    Types: Cigarettes   Smokeless tobacco: Never  Vaping Use   Vaping Use: Never used  Substance Use Topics   Alcohol use: Not Currently   Drug use: Yes    Types: Methamphetamines    Comment: last used yesterday    Family History  Problem Relation Age of Onset   Diabetes Mother    Hypertension Mother    Cancer Mother    Cancer Father    Diabetes Father    Hypertension Father     Social History   Substance and Sexual Activity  Sexual Activity Yes   Birth control/protection: None   Comment: offered condoms     Objective:   Vitals:   05/03/21 1445  BP: 116/78  Pulse: 94  Resp: 16  SpO2: 98%  Weight: 177 lb 9.6 oz (80.6 kg)  Height: '6\' 1"'  (1.854 m)   Body mass index is  23.43 kg/m.  Physical Exam Vitals reviewed.  Constitutional:      Appearance: He is well-developed.     Comments: Seated comfortably in chair during visit.   HENT:     Mouth/Throat:     Dentition: Normal dentition. No dental abscesses.  Cardiovascular:     Rate and Rhythm: Normal rate and regular rhythm.     Heart sounds: Normal heart sounds.  Pulmonary:     Effort: Pulmonary effort is normal.     Breath sounds: Normal breath sounds.  Abdominal:     General: There is no distension.     Palpations: Abdomen is soft.     Tenderness: There is no abdominal tenderness.  Lymphadenopathy:     Cervical: No cervical adenopathy.  Skin:    General: Skin is warm and dry.     Findings: No rash.  Neurological:     Mental Status: He is alert and oriented to person, place, and time.  Psychiatric:        Judgment: Judgment normal.     Comments: In good spirits today and engaged in care discussion.     Lab Results Lab Results  Component Value Date   WBC 5.4 03/16/2021   HGB 16.0 03/16/2021   HCT 46.8 03/16/2021   MCV 87.6 03/16/2021   PLT 283 03/16/2021    Lab Results  Component Value Date   CREATININE 1.29 (H) 03/16/2021   BUN 35 (H) 03/16/2021   NA 134 (L) 03/16/2021   K 3.9 03/16/2021   CL 94 (L) 03/16/2021   CO2 30 03/16/2021    Lab Results  Component Value Date   ALT 46 03/16/2021   AST 48 (H) 03/16/2021   ALKPHOS 63 01/29/2021   BILITOT 0.5 03/16/2021    Lab Results  Component Value Date   CHOL 115 10/09/2019   HDL 33 (L) 10/09/2019   LDLCALC 63 10/09/2019   TRIG 103 10/09/2019   CHOLHDL 3.5 10/09/2019   HIV 1 RNA Quant (copies/mL)  Date Value  03/16/2021 94,200 (H)  05/12/2020 371 (H)  01/13/2020 292,000 (H)   CD4 T Cell Abs (/uL)  Date Value  03/16/2021 600  05/12/2020 569  01/13/2020 403     Assessment & Plan:   Problem List Items Addressed This Visit       Unprioritized   Syphilis    Unfortunately after he left I saw he needed bicillin  injection for new syphilis infection concern (titer rose from 1:1 >> 1:8). Will plan for treatment next week with bicillin injections. He only needs 1  round for early latent infection.       Methamphetamine use disorder, severe, dependence ([place])    Has been "on the lamb" currently and unhoused. Passively looking at some rehab programs for meth addiction. He has never been able to quit for durable time frame. I am not sure he is totally ready to do so and expressed I think this is a large reason he is not successful taking his HIV medications.       Homeless    Referral to [facility] today. I think he needs more aggressive efforts and will try to coordinate intake with Mitch next Monday.       Relevant Orders   AMB REFERRAL TO [facility]   AMB REFERRAL TO COMMUNITY SERVICE AGENCY   HIV (human immunodeficiency virus infection) ([place])    Back on biktarvy once daily - he is not sure how long he has been taking it but estimates about 4 weeks. Feels like he misses 1-2 doses a week. Will check VL today for monitoring on medication. Genotype reviewed and should be enough to treat his infection with biktarvy alone.  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  He has hep c coinfection also we need to treat eventually.  RTC in 1 week to see if we can coordinate intake with Mitch to work on bridge counseling given 75% no show for [facility] appts and never suppressed viral load. He currently gets mail at [facility].       Relevant Orders   HIV-1 RNA quant-no reflex-bld   AMB REFERRAL TO COMMUNITY SERVICE AGENCY   AMB REFERRAL TO COMMUNITY SERVICE AGENCY   Chronic hepatitis C without hepatic coma ([place]) - Primary    Chronically infected. Will check genotype and fibrotest today. Unlikely he has severe damage from this but given hiv coinfection could accelerate fibrosis.  I talked with him that I would like to see him in a better place of stability to commit to regular medication  adherence.       Relevant Orders   Hepatitis C genotype   Liver Fibrosis, FibroTest-ActiTest    Janene Madeira, MSN, NP-C [facility] for Infectious [place] Pager: [phone] Office: [phone]  05/03/21  4:05 PM

## 2021-05-03 NOTE — Assessment & Plan Note (Signed)
Has been "on the lamb" currently and unhoused. Passively looking at some rehab programs for meth addiction. He has never been able to quit for durable time frame. I am not sure he is totally ready to do so and expressed I think this is a large reason he is not successful taking his HIV medications.

## 2021-05-03 NOTE — Assessment & Plan Note (Addendum)
Unfortunately after he left I saw he needed bicillin injection for new syphilis infection concern (titer rose from 1:1 >> 1:8). Will plan for treatment next week with bicillin injections. He only needs 1 round for early latent infection.

## 2021-05-03 NOTE — Patient Instructions (Signed)
Nice to meet you today   Please continue taking the Biktarvy every day. Try to not miss any doses as best as you can.   I will need some more blood work to help pick the right hepatitis C medication for you.   Please come back in 1 week so we can help you meet with Mitch and help to coordinate intake with him.

## 2021-05-03 NOTE — Assessment & Plan Note (Signed)
Chronically infected. Will check genotype and fibrotest today. Unlikely he has severe damage from this but given hiv coinfection could accelerate fibrosis.  I talked with him that I would like to see him in a better place of stability to commit to regular medication adherence.

## 2021-05-03 NOTE — Assessment & Plan Note (Signed)
Back on biktarvy once daily - he is not sure how long he has been taking it but estimates about 4 weeks. Feels like he misses 1-2 doses a week. Will check VL today for monitoring on medication. Genotype reviewed and should be enough to treat his infection with biktarvy alone.  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  He has hep c coinfection also we need to treat eventually.  RTC in 1 week to see if we can coordinate intake with Mitch to work on bridge counseling given 75% no show for [facility] appts and never suppressed viral load. He currently gets mail at [facility].

## 2021-05-03 NOTE — Assessment & Plan Note (Signed)
Referral to [facility] today. I think he needs more aggressive efforts and will try to coordinate intake with Mitch next Monday.

## 2021-05-07 LAB — LIVER FIBROSIS, FIBROTEST-ACTITEST
ALT: 34 U/L (ref 9–46)
Alpha-2-Macroglobulin: 196 mg/dL (ref 106–279)
Apolipoprotein A1: 132 mg/dL (ref 94–176)
Bilirubin: 0.5 mg/dL (ref 0.2–1.2)
Fibrosis Score: 0.54
GGT: 33 U/L (ref 3–90)
Haptoglobin: <8 mg/dL — ABNORMAL LOW (ref 43–212)
Necroinflammat ACT Score: 0.23
Reference ID: 4076175

## 2021-05-07 LAB — HEPATITIS C GENOTYPE

## 2021-05-07 LAB — HIV-1 RNA QUANT-NO REFLEX-BLD
HIV 1 RNA Quant: 44 Copies/mL — ABNORMAL HIGH
HIV-1 RNA Quant, Log: 1.64 Log cps/mL — ABNORMAL HIGH

## 2021-05-11 ENCOUNTER — Ambulatory Visit: Payer: Self-pay | Admitting: Infectious Diseases

## 2021-06-16 ENCOUNTER — Ambulatory Visit: Payer: Self-pay | Admitting: Family

## 2021-07-02 ENCOUNTER — Encounter (HOSPITAL_COMMUNITY): Payer: Self-pay

## 2021-07-02 ENCOUNTER — Emergency Department (HOSPITAL_COMMUNITY)
Admission: EM | Admit: 2021-07-02 | Discharge: 2021-07-02 | Disposition: A | Discharge status: Home / Self Care | Payer: Self-pay | Attending: Emergency Medicine | Admitting: Emergency Medicine

## 2021-07-02 ENCOUNTER — Other Ambulatory Visit: Payer: Self-pay | Admitting: Pharmacist

## 2021-07-02 DIAGNOSIS — Z5321 Procedure and treatment not carried out due to patient leaving prior to being seen by health care provider: Secondary | ICD-10-CM | POA: Insufficient documentation

## 2021-07-02 DIAGNOSIS — B20 Human immunodeficiency virus [HIV] disease: Secondary | ICD-10-CM

## 2021-07-02 DIAGNOSIS — R21 Rash and other nonspecific skin eruption: Secondary | ICD-10-CM | POA: Insufficient documentation

## 2021-07-02 DIAGNOSIS — L299 Pruritus, unspecified: Secondary | ICD-10-CM | POA: Insufficient documentation

## 2021-07-02 NOTE — ED Provider Notes (Signed)
Emergency Medicine Provider Triage Evaluation Note  [name] , a 35 y.o. male  was evaluated in triage.  Pt complains of rash to back after waking up at friends house.  Mostly on his right upper back.  Review of Systems  Positive: Rash, itching Negative: fever  Physical Exam  BP (!) 149/98    Pulse (!) 105    Temp 99.1 F (37.3 C) (Oral)    Resp 16    SpO2 99%  Gen:   Awake, no distress   Resp:  Normal effort  MSK:   Moves extremities without difficulty  Other:  Small rash noted to right upper back, signs of exoriation  Medical Decision Making  Medically screening exam initiated at 10:07 PM.  Appropriate orders placed.  [name] was informed that the remainder of the evaluation will be completed by another provider, this initial triage assessment does not replace that evaluation, and the importance of remaining in the ED until their evaluation is complete.     Garlon Hatchet, PA-C 07/02/21 2208    Bethann Berkshire, MD 07/02/21 [phone]

## 2021-07-02 NOTE — ED Triage Notes (Signed)
Pt states that he was sleeping at a friends house and then began itching on his back, has some redness to R shoulder

## 2021-07-02 NOTE — ED Notes (Signed)
Pt stated he didn't want to be treated, LWBS

## 2021-07-28 ENCOUNTER — Emergency Department (HOSPITAL_COMMUNITY)
Admission: EM | Admit: 2021-07-28 | Discharge: 2021-07-28 | Discharge status: Against Medical Advice | Payer: 59 | Source: Home / Self Care

## 2021-07-31 ENCOUNTER — Other Ambulatory Visit: Payer: Self-pay

## 2021-07-31 ENCOUNTER — Emergency Department (HOSPITAL_COMMUNITY)
Admission: EM | Admit: 2021-07-31 | Discharge: 2021-07-31 | Disposition: A | Discharge status: Home / Self Care | Payer: 59 | Attending: Student | Admitting: Student

## 2021-07-31 ENCOUNTER — Encounter (HOSPITAL_COMMUNITY): Payer: Self-pay | Admitting: Emergency Medicine

## 2021-07-31 DIAGNOSIS — M79671 Pain in right foot: Secondary | ICD-10-CM | POA: Insufficient documentation

## 2021-07-31 DIAGNOSIS — K029 Dental caries, unspecified: Secondary | ICD-10-CM | POA: Insufficient documentation

## 2021-07-31 DIAGNOSIS — M79672 Pain in left foot: Secondary | ICD-10-CM | POA: Insufficient documentation

## 2021-07-31 DIAGNOSIS — K0889 Other specified disorders of teeth and supporting structures: Secondary | ICD-10-CM

## 2021-07-31 MED ORDER — AMOXICILLIN-POT CLAVULANATE 875-125 MG PO TABS: 1.0000 | ORAL_TABLET | Freq: Two times a day (BID) | ORAL | 0 refills | Status: DC

## 2021-07-31 MED ORDER — LIDOCAINE VISCOUS HCL 2 % MT SOLN: 15.0000 mL | OROMUCOSAL | 0 refills | Status: DC | PRN

## 2021-07-31 NOTE — ED Triage Notes (Signed)
Patient reports bilateral feet pain onset yesterday , denies injury , patient added left lower molar pain .

## 2021-07-31 NOTE — Discharge Instructions (Addendum)
Take antibiotics as prescribed.  Take entire course, even if symptoms improve. Use Tylenol and ibuprofen help with pain.  Use viscous lidocaine to help with further pain control. Call the dentist listed below to set up a follow-up appointment.  If you are unable to get in with them, I recommend you reach out to your HIV clinic as they may be able to assist you with getting a dental appointment. Try and stay off your feet to help the muscles of your feet rest.  When you go from sitting to standing, do gentle stretches before you start walking. You may try orthotics to help with your foot pain. Return to the emergency room with any new, worsening, concerning symptoms

## 2021-07-31 NOTE — ED Provider Notes (Signed)
[facility] EMERGENCY DEPARTMENT Provider Note   CSN: [id number] Arrival date & time: 07/31/21  0157     History  Chief Complaint  Patient presents with   Dental Pain / Feet Pain     [name] is a 36 y.o. male presenting for evaluation of bilateral foot pain and dental pain.  Patient states he has had worsening bilateral foot pain since yesterday.  He often has foot pain due to being flat-footed and walking a lot.  He also reports left lower tooth pain.  He has had issues with this tooth before, has not followed up with a dentist recently.  He also reports he is having gum issues of the lower teeth.  He has a history of HIV for which he takes medication, states he takes no other medicines daily.  He has been taking Tylenol and ibuprofen without improvement of his symptoms.  No fevers or chills.  No difficulty opening his mouth or swallowing.  No fall, trauma, or injury.  HPI     Home Medications Prior to Admission medications   Medication Sig Start Date End Date Taking? Authorizing Provider  amoxicillin-clavulanate (AUGMENTIN) 875-125 MG tablet Take 1 tablet by mouth every 12 (twelve) hours. 07/31/21  Yes Chinaza Rooke, PA-C  lidocaine (XYLOCAINE) 2 % solution Use as directed 15 mLs in the mouth or throat as needed for mouth pain. 07/31/21  Yes Reyana Leisey, PA-C  acetaminophen (TYLENOL) 500 MG tablet Take 500-1,000 mg by mouth every 6 (six) hours as needed for mild pain.    [provider]  BIKTARVY 50-200-25 MG TABS tablet TAKE 1 TABLET BY MOUTH DAILY 07/05/21   Blanchard Kelch, NP  clindamycin (CLEOCIN) 300 MG capsule TAKE 2 CAPSULES (600 MG TOTAL) BY MOUTH 3 (THREE) TIMES DAILY FOR 7 DAYS. Patient not taking: Reported on 05/03/2021 10/16/20 10/16/21  Simmons-Robinson, Tawanna Cooler, MD  ibuprofen (ADVIL) 200 MG tablet Take 600 mg by mouth every 6 (six) hours as needed for mild pain.    [provider]  omeprazole (PRILOSEC) 20 MG capsule Take  1 capsule (20 mg total) by mouth daily. Patient not taking: Reported on 02/14/2019 06/24/18 02/14/19  Roxy Horseman, PA-C      Allergies    Patient has no known allergies.    Review of Systems   Review of Systems  HENT:  Positive for dental problem.   Musculoskeletal:  Positive for arthralgias.   Physical Exam Updated Vital Signs BP 139/83    Pulse 98    Temp 98.3 F (36.8 C) (Oral)    Resp 18    Ht 6\' 1"  (1.854 m)    Wt 85 kg    SpO2 99%    BMI 24.72 kg/m  Physical Exam Vitals and nursing note reviewed.  Constitutional:      General: He is not in acute distress.    Appearance: Normal appearance.     Comments: Resting in the bed in no acute distress  HENT:     Head: Normocephalic and atraumatic.     Mouth/Throat:     Dentition: Abnormal dentition. Dental tenderness, gingival swelling and dental caries present.      Comments: Overall poor dentition with multiple dental caries.  Tenderness palpation of left lower teeth.  Gum of the front lower teeth is receding and discolored, with mild tenderness. Eyes:     Conjunctiva/sclera: Conjunctivae normal.     Pupils: Pupils are equal, round, and reactive to light.  Cardiovascular:  Rate and Rhythm: Normal rate and regular rhythm.     Pulses: Normal pulses.  Pulmonary:     Effort: Pulmonary effort is normal. No respiratory distress.     Breath sounds: Normal breath sounds. No wheezing.     Comments: Speaking in full sentences.  Clear lung sounds in all fields. Abdominal:     General: There is no distension.     Palpations: Abdomen is soft. There is no mass.     Tenderness: There is no abdominal tenderness. There is no guarding or rebound.  Musculoskeletal:        General: Normal range of motion.     Cervical back: Normal range of motion and neck supple.  Feet:     Comments: Tenderness palpation of the arch of bilateral feet.  Pedal pulses 2+ bilaterally.  Good distal sensation and cap refill.  No wounds, blisters, or  lesions. Skin:    General: Skin is warm and dry.     Capillary Refill: Capillary refill takes less than 2 seconds.  Neurological:     Mental Status: He is alert and oriented to person, place, and time.  Psychiatric:        Mood and Affect: Mood and affect normal.        Speech: Speech normal.        Behavior: Behavior normal.    ED Results / Procedures / Treatments   Labs (all labs ordered are listed, but only abnormal results are displayed) Labs Reviewed - No data to display  EKG None  Radiology No results found.  Procedures Procedures    Medications Ordered in ED Medications - No data to display  ED Course/ Medical Decision Making/ A&P                           Medical Decision Making   This patient presents to the ED for concern of dental pain and foot pain.  This involves a number of treatment options, and is a complaint that carries with it a moderate risk of complications and morbidity.  The differential diagnosis includes dental infection, dental degenerative exposure, ludwigs or deep space infection, foot infection, trench foot, plantar fasciitis.   Co morbidities and Social determinants of Health:  Homelessness, hiv, drug use (meth)   Disposition:  After consideration of the diagnostic results and the patients response to treatment, I feel that the patent would benefit from outpatient management.  Will treat with antibiotics for dental infection.  Exam is not consistent with Ludwick's or deep space infection, do not feel patient needs further emergent imaging.  Viscous lidocaine given for pain control.  Pain of the feet is present on the soles of the feet, most likely muscular pain/plantar fasciitis.  No trauma or injury.  I do not feel x-rays would be beneficial as I doubt fractures.  No signs of infection.  Discussed use of orthotics.  Encourage follow-up with a dentist.  At this time, patient appears safe for discharge.  Return precautions given.  Patient  states he understands and agrees to plan  Final Clinical Impression(s) / ED Diagnoses Final diagnoses:  Pain, dental  Bilateral foot pain    Rx / DC Orders ED Discharge Orders          Ordered    amoxicillin-clavulanate (AUGMENTIN) 875-125 MG tablet  Every 12 hours        07/31/21 0237    lidocaine (XYLOCAINE) 2 % solution  As needed  07/31/21 0237              Alveria Apley, PA-C 07/31/21 0248    Sabas Sous, MD 07/31/21 [phone]

## 2021-08-16 ENCOUNTER — Other Ambulatory Visit: Payer: Self-pay | Admitting: Infectious Diseases

## 2021-08-16 DIAGNOSIS — B20 Human immunodeficiency virus [HIV] disease: Secondary | ICD-10-CM

## 2021-08-17 ENCOUNTER — Telehealth: Payer: Self-pay

## 2021-08-17 NOTE — Telephone Encounter (Signed)
Attempted  call patient to schedule appt for labs, office visit, ADAP renewal. Not able to reach patient at this time. Refills for Biktarvy sent to refills. Juanita Laster, RMA

## 2021-08-18 ENCOUNTER — Emergency Department (HOSPITAL_COMMUNITY): Admission: EM | Admit: 2021-08-18 | Discharge: 2021-08-18 | Discharge status: Against Medical Advice | Payer: 59

## 2021-08-18 ENCOUNTER — Ambulatory Visit: Payer: 59 | Admitting: Family

## 2021-08-19 ENCOUNTER — Encounter (HOSPITAL_COMMUNITY): Payer: Self-pay | Admitting: Emergency Medicine

## 2021-08-19 ENCOUNTER — Emergency Department (HOSPITAL_COMMUNITY)
Admission: EM | Admit: 2021-08-19 | Discharge: 2021-08-20 | Disposition: A | Discharge status: Home / Self Care | Payer: No Typology Code available for payment source | Attending: Physician Assistant | Admitting: Physician Assistant

## 2021-08-19 ENCOUNTER — Other Ambulatory Visit: Payer: Self-pay

## 2021-08-19 DIAGNOSIS — K0889 Other specified disorders of teeth and supporting structures: Secondary | ICD-10-CM | POA: Diagnosis present

## 2021-08-19 NOTE — ED Triage Notes (Signed)
Patient reports right upper/dental pain onset this week .

## 2021-08-20 MED ORDER — AMOXICILLIN-POT CLAVULANATE 875-125 MG PO TABS: 1.0000 | ORAL_TABLET | Freq: Two times a day (BID) | ORAL | 0 refills | Status: AC

## 2021-08-20 NOTE — ED Provider Notes (Signed)
[name] EMERGENCY DEPARTMENT Provider Note   CSN: [id number] Arrival date & time: 08/19/21  2317     History  No chief complaint on file.   [name] is a 36 y.o. male.  HPI  36 year old male presents to the emergency department today for evaluation of dental pain.  He is complaining of pain to the right upper mouth and right lower mouth.  He states that he was seen recently in the emergency department and was given a prescription for antibiotics however he filled the prescription late and has only been taking it once daily rather than twice daily.  He has not had any fevers but he does report facial swelling that has not improved since taking the antibiotic.  Home Medications Prior to Admission medications   Medication Sig Start Date End Date Taking? Authorizing Provider  amoxicillin-clavulanate (AUGMENTIN) 875-125 MG tablet Take 1 tablet by mouth 2 (two) times daily for 7 days. 08/20/21 08/27/21 Yes Vue Pavon S, PA-C  acetaminophen (TYLENOL) 500 MG tablet Take 500-1,000 mg by mouth every 6 (six) hours as needed for mild pain.    [provider]  amoxicillin-clavulanate (AUGMENTIN) 875-125 MG tablet Take 1 tablet by mouth every 12 (twelve) hours. 07/31/21   Caccavale, Sophia, PA-C  BIKTARVY 50-200-25 MG TABS tablet TAKE 1 TABLET BY MOUTH DAILY 08/17/21   Blanchard Kelch, NP  clindamycin (CLEOCIN) 300 MG capsule TAKE 2 CAPSULES (600 MG TOTAL) BY MOUTH 3 (THREE) TIMES DAILY FOR 7 DAYS. Patient not taking: Reported on 05/03/2021 10/16/20 10/16/21  Simmons-Robinson, Tawanna Cooler, MD  ibuprofen (ADVIL) 200 MG tablet Take 600 mg by mouth every 6 (six) hours as needed for mild pain.    [provider]  lidocaine (XYLOCAINE) 2 % solution Use as directed 15 mLs in the mouth or throat as needed for mouth pain. 07/31/21   Caccavale, Sophia, PA-C  omeprazole (PRILOSEC) 20 MG capsule Take 1 capsule (20 mg total) by mouth daily. Patient not taking: Reported on  02/14/2019 06/24/18 02/14/19  Roxy Horseman, PA-C      Allergies    Patient has no known allergies.    Review of Systems   Review of Systems See HPI for pertinent positives or negatives.   Physical Exam Updated Vital Signs BP (!) 156/93 (BP Location: Right Arm)    Pulse 77    Temp 97.9 F (36.6 C) (Oral)    Resp 20    SpO2 97%  Physical Exam Constitutional:      General: He is not in acute distress.    Appearance: He is well-developed.  HENT:     Mouth/Throat:     Comments: Multiple missing teeth and dental caries. TTP along the upper gumline without fluctuance or obvious abscess. No sublingual or submandibular swelling Eyes:     Conjunctiva/sclera: Conjunctivae normal.  Cardiovascular:     Rate and Rhythm: Normal rate and regular rhythm.  Pulmonary:     Effort: Pulmonary effort is normal.     Breath sounds: Normal breath sounds.  Skin:    General: Skin is warm and dry.  Neurological:     Mental Status: He is alert and oriented to person, place, and time.    ED Results / Procedures / Treatments   Labs (all labs ordered are listed, but only abnormal results are displayed) Labs Reviewed - No data to display  EKG None  Radiology No results found.  Procedures Procedures    Medications Ordered in ED Medications - No data  to display  ED Course/ Medical Decision Making/ A&P                           Medical Decision Making  Patient with toothache.  No gross abscess.  Exam unconcerning for Ludwig's angina or spread of infection.  Will treat with augmentin. Pt was not taking this medication correctly and therefore I do not consider his presentation as a failure of outpatient antibiotics. I feel that it is appropriate to give rx for additional abx since his infection is only partially treated. Urged patient to follow-up with dentist.  Advised on return precautions.   Final Clinical Impression(s) / ED Diagnoses Final diagnoses:  Pain, dental    Rx / DC  Orders ED Discharge Orders          Ordered    amoxicillin-clavulanate (AUGMENTIN) 875-125 MG tablet  2 times daily        08/20/21 [address], Levorn Oleski S, PA-C 08/20/21 0006    Zadie Rhine, MD 08/20/21 [phone]

## 2021-08-20 NOTE — Discharge Instructions (Addendum)
You were given a prescription for antibiotics. Please take the antibiotic prescription fully.   Please follow up with your primary care provider within 5-7 days for re-evaluation of your symptoms. If you do not have a primary care provider, information for a healthcare clinic has been provided for you to make arrangements for follow up care. Please return to the emergency department for any new or worsening symptoms.  

## 2021-10-19 ENCOUNTER — Telehealth: Payer: Self-pay

## 2021-10-19 NOTE — Telephone Encounter (Signed)
Received release of records for patient from [facility] ?Phone-[phone]; Fax [phone] ? ?[facility] made aware, provider made aware. ?Valarie Cones ? ?

## 2022-03-30 ENCOUNTER — Ambulatory Visit: Payer: Commercial Managed Care - HMO | Admitting: Family

## 2022-04-05 ENCOUNTER — Ambulatory Visit: Payer: Self-pay | Admitting: Infectious Diseases

## 2022-04-19 NOTE — Progress Notes (Signed)
Brief Narrative   Patient ID: [name], male    DOB: 1985-10-10, 36 y.o.   MRN: [id number]  [name] is a 36 y/o male diagnosed with HIV disease in June 2020 with risk factor of drug use and sexual contact. Entered care in June 2021. Initial viral load was 1.47 million with CD4 count 588. Genosure with associated mutations of E35D, A71V,  V179I, I178L, Q207E with no noted medication resistant mutations. No history of opportunistic infection. [id number] negative. ART history with Dovato and now Biktarvy.   Subjective:    Chief Complaint  Patient presents with   Follow-up    Interested in switching Biktarvy to something else Mental health concerns - scheduled appointment with counselor  Dental form     HPI:  [name] is a 36 y.o. male with HIV disease and chronic Hepatitis C last seen on 05/03/21 by Paul Alberts, NP with good adherence and tolerance to Biktarvy. Viral load was 44 with CD4 count 600. Also noted to have a positive syphilis titer of 1:8 that was up from 1:1 previously. Hepatitis C RNA level of 1.81 million with Genotype 1a and fibrosis score of F2. Here today for follow up.  [name] was released from incarceration about 3 weeks ago. Has been taking Biktarvy with a few missed doses since being released. Similar to Dovato, feels shortness of breath, fatigue and decreased exercise tolerance with Biktarvy and would like to change medication. Otherwise feeling well today with no new concerns/complaints. Continues to use methamphetamine with most recent usage being IV and smoking earlier today. No currently employed and does not have any current income. Condoms and STD testing offered.   Denies fevers, chills, night sweats, headaches, changes in vision, neck pain/stiffness, nausea, diarrhea, vomiting, lesions or rashes.  No Known Allergies    Outpatient Medications Prior to Visit  Medication Sig Dispense Refill   acetaminophen (TYLENOL) 500 MG tablet Take 500-1,000 mg by mouth  every 6 (six) hours as needed for mild pain.     ibuprofen (ADVIL) 200 MG tablet Take 600 mg by mouth every 6 (six) hours as needed for mild pain.     BIKTARVY 50-200-25 MG TABS tablet TAKE 1 TABLET BY MOUTH DAILY 30 tablet 0   amoxicillin-clavulanate (AUGMENTIN) 875-125 MG tablet Take 1 tablet by mouth every 12 (twelve) hours. (Patient not taking: Reported on 04/20/2022) 14 tablet 0   lidocaine (XYLOCAINE) 2 % solution Use as directed 15 mLs in the mouth or throat as needed for mouth pain. (Patient not taking: Reported on 04/20/2022) 100 mL 0   No facility-administered medications prior to visit.     Past Medical History:  Diagnosis Date   ADHD (attention deficit hyperactivity disorder)    Chronic hepatitis C without hepatic coma (HCC) 05/12/2020   Drug abuse (HCC)    meth, marijuana   HIV positive (HCC)      Past Surgical History:  Procedure Laterality Date   ORIF MANDIBULAR FRACTURE Right 10/16/2020   Procedure: OPEN REDUCTION INTERNAL FIXATION (ORIF) SUBCONDYLAR FRACTURE WITH REPAIR OF 3CM CHIN LACERATION;  Surgeon: Lovena Neighbours, MD;  Location: [facility] OR;  Service: Oral Surgery;  Laterality: Right;      Review of Systems  Constitutional:  Negative for appetite change, chills, fatigue, fever and unexpected weight change.  Eyes:  Negative for visual disturbance.  Respiratory:  Negative for cough, chest tightness, shortness of breath and wheezing.   Cardiovascular:  Negative for chest pain and leg swelling.  Gastrointestinal:  Negative for abdominal pain, constipation, diarrhea, nausea and vomiting.  Genitourinary:  Negative for dysuria, flank pain, frequency, genital sores, hematuria and urgency.  Skin:  Negative for rash.  Allergic/Immunologic: Negative for immunocompromised state.  Neurological:  Negative for dizziness and headaches.      Objective:    BP 119/76   Pulse 85   Temp 98 F (36.7 C) (Oral)   SpO2 97%  Nursing note and vital signs reviewed.  Physical  Exam Constitutional:      General: He is not in acute distress.    Appearance: He is well-developed.  Eyes:     Conjunctiva/sclera: Conjunctivae normal.  Cardiovascular:     Rate and Rhythm: Normal rate and regular rhythm.     Heart sounds: Normal heart sounds. No murmur heard.    No friction rub. No gallop.  Pulmonary:     Effort: Pulmonary effort is normal. No respiratory distress.     Breath sounds: Normal breath sounds. No wheezing or rales.  Chest:     Chest wall: No tenderness.  Abdominal:     General: Bowel sounds are normal.     Palpations: Abdomen is soft.     Tenderness: There is no abdominal tenderness.  Musculoskeletal:     Cervical back: Neck supple.  Lymphadenopathy:     Cervical: No cervical adenopathy.  Skin:    General: Skin is warm and dry.     Findings: No rash.  Neurological:     Mental Status: He is alert and oriented to person, place, and time.  Psychiatric:        Behavior: Behavior normal.        Thought Content: Thought content normal.        Judgment: Judgment normal.         04/20/2022    2:22 PM 05/12/2020   10:25 AM 01/13/2020    8:51 AM  Depression screen PHQ 2/9  Decreased Interest 1 0 0  Down, Depressed, Hopeless 1 0 1  PHQ - 2 Score 2 0 1  Altered sleeping 0    Tired, decreased energy 3    Change in appetite 1    Feeling bad or failure about yourself  0    Trouble concentrating 3    Moving slowly or fidgety/restless 1    Suicidal thoughts 0    PHQ-9 Score 10    Difficult doing work/chores Somewhat difficult         Assessment & Plan:    Patient Active Problem List   Diagnosis Date Noted   Healthcare maintenance 04/20/2022   Homeless 05/03/2021   Syphilis 05/03/2021   Closed fracture of body of mandible (HCC)    Syncope and collapse    Fall 10/15/2020   Chronic hepatitis C without hepatic coma (HCC) 05/12/2020   Methamphetamine intoxication (HCC) 12/20/2019   AKI (acute kidney injury) (HCC) 12/20/2019   Elevated  transaminase level 12/20/2019   Elevated CK    Methamphetamine use disorder, severe, dependence (HCC) 07/10/2019   MDD (major depressive disorder), recurrent severe, 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 Will need new Hepatitis C RNA level if he is wishing to complete treatment. I have my concerns about his ability to follow up and follow through with treatment  at this time.       Relevant Medications   Darunavir-Cobicistat-Emtricitabine-Tenofovir Alafenamide (SYMTUZA) 800-150-200-10 MG TABS   Other Relevant Orders   Hepatitis C RNA quantitative     Other   HIV (human immunodeficiency virus infection) (HCC) - Primary    [name] likely has poorly controlled virus with less than optimal adherence to [facility] with reported symptoms of shortness of breath. Suspect this is related to his continued methamphetamine use as opposed to side effects of medication as neither Dovato nor Biktarvy have respiratory side effects. Qualifies for ICAP and met with financial assistance today. Attempted to check lab work however [name] left the office after having to go to he bathroom. Sample of Symtuza provided. Plan of follow up in 1 month or sooner if needed with lab work on the same day.       Relevant Medications   Darunavir-Cobicistat-Emtricitabine-Tenofovir Alafenamide (SYMTUZA) 800-150-200-10 MG TABS   Other Relevant Orders   AMB REFERRAL TO [facility]   Comprehensive metabolic panel   HIV RNA, RTPCR W/R GT (RTI, PI,INT)   RPR   T-helper cell (CD4)- ([facility] clinic only)   Methamphetamine use disorder, severe, dependence (HCC)    [name] continues to use methamphetamine with the most recent use earlier today. Suspect this may be the cause of his shortness of breath that he  experiences. This will continue to be a barrier as evidence by his leaving prior to completion of blood work today. Encouraged cessation.       Syphilis    Previously treated with RPR positive titer of 1:8 without confirmatory testing for successful treatment. No current symptoms. Check RPR.       Relevant Medications   Darunavir-Cobicistat-Emtricitabine-Tenofovir Alafenamide (SYMTUZA) 800-150-200-10 MG TABS   Healthcare maintenance    Discussed importance of safe sexual practice and condom use. Condoms and STD testing offered.  Declines vaccinations. Due for routine dental care.         I have discontinued [name]'s amoxicillin-clavulanate, lidocaine, and Biktarvy. I am also having him start on Symtuza. Additionally, I am having him maintain his ibuprofen and acetaminophen.   Meds ordered this encounter  Medications   Darunavir-Cobicistat-Emtricitabine-Tenofovir Alafenamide (SYMTUZA) 800-150-200-10 MG TABS    Sig: Take 1 tablet by mouth daily with breakfast.    Dispense:  30 tablet    Refill:  2    Order Specific Question:   Supervising Provider    Answer:   [name] [[id number]]     Follow-up: Return in about 1 month (around 05/21/2022), or if symptoms worsen or fail to improve.   Paul Eke, MSN, FNP-C Nurse Practitioner [facility] Main number: [phone]

## 2022-04-20 ENCOUNTER — Encounter: Payer: Self-pay | Admitting: Family

## 2022-04-20 ENCOUNTER — Other Ambulatory Visit: Payer: Self-pay

## 2022-04-20 ENCOUNTER — Other Ambulatory Visit (HOSPITAL_COMMUNITY): Payer: Self-pay

## 2022-04-20 ENCOUNTER — Ambulatory Visit (INDEPENDENT_AMBULATORY_CARE_PROVIDER_SITE_OTHER): Payer: Commercial Managed Care - HMO | Admitting: Family

## 2022-04-20 VITALS — BP 119/76 | HR 85 | Temp 98.0°F

## 2022-04-20 DIAGNOSIS — B182 Chronic viral hepatitis C: Secondary | ICD-10-CM

## 2022-04-20 DIAGNOSIS — B20 Human immunodeficiency virus [HIV] disease: Secondary | ICD-10-CM | POA: Diagnosis not present

## 2022-04-20 DIAGNOSIS — F1520 Other stimulant dependence, uncomplicated: Secondary | ICD-10-CM

## 2022-04-20 DIAGNOSIS — A539 Syphilis, unspecified: Secondary | ICD-10-CM | POA: Diagnosis not present

## 2022-04-20 DIAGNOSIS — Z0000 Encounter for general adult medical examination without abnormal findings: Secondary | ICD-10-CM

## 2022-04-20 MED ORDER — SYMTUZA 800-150-200-10 MG PO TABS: 1.0000 | ORAL_TABLET | Freq: Every day | ORAL | 2 refills | Status: DC

## 2022-04-20 NOTE — Patient Instructions (Addendum)
Nice to see you.  We will check your lab work today.  Continue to take your medication daily as prescribed.  Plan for follow up in 1 months or sooner if needed with lab work on the same day.  Have a great day and stay safe!  Dental appointments [phone]

## 2022-04-20 NOTE — Assessment & Plan Note (Signed)
Previously treated with RPR positive titer of 1:8 without confirmatory testing for successful treatment. No current symptoms. Check RPR.

## 2022-04-20 NOTE — Assessment & Plan Note (Signed)
[name] has untreated chronic hepatitis C. Will need new Hepatitis C RNA level if he is wishing to complete treatment. I have my concerns about his ability to follow up and follow through with treatment at this time.

## 2022-04-20 NOTE — Assessment & Plan Note (Signed)
[name] likely has poorly controlled virus with less than optimal adherence to [facility] with reported symptoms of shortness of breath. Suspect this is related to his continued methamphetamine use as opposed to side effects of medication as neither Dovato nor Biktarvy have respiratory side effects. Qualifies for ICAP and met with financial assistance today. Attempted to check lab work however [place] left the office after having to go to he bathroom. Sample of [place] provided. Plan of follow up in 1 month or sooner if needed with lab work on the same day.

## 2022-04-20 NOTE — Assessment & Plan Note (Signed)
[name] continues to use methamphetamine with the most recent use earlier today. Suspect this may be the cause of his shortness of breath that he experiences. This will continue to be a barrier as evidence by his leaving prior to completion of blood work today. Encouraged cessation.

## 2022-04-20 NOTE — Assessment & Plan Note (Signed)
   Discussed importance of safe sexual practice and condom use. Condoms and STD testing offered.   Declines vaccinations.  Due for routine dental care.

## 2022-04-21 ENCOUNTER — Other Ambulatory Visit: Payer: Self-pay | Admitting: Pharmacist

## 2022-04-21 DIAGNOSIS — B20 Human immunodeficiency virus [HIV] disease: Secondary | ICD-10-CM

## 2022-04-21 MED ORDER — SYMTUZA 800-150-200-10 MG PO TABS: 1.0000 | ORAL_TABLET | Freq: Every day | ORAL | 0 refills | Status: AC

## 2022-04-21 NOTE — Progress Notes (Signed)
Medication Samples have been provided to the patient.  Drug name: Symtuza        Strength: 800/150/200/10 mg Qty: 30  Tablets (1 bottles) LOT: [id number]   Exp.Date: 4/25  Dosing instructions: Take one tablet by mouth once daily with food  The patient has been instructed regarding the correct time, dose, and frequency of taking this medication, including desired effects and most common side effects.   Jemari Hallum, PharmD, CPP, BCIDP Clinical Pharmacist Practitioner Infectious Diseases Clinical Pharmacist [facility]  

## 2022-05-17 ENCOUNTER — Ambulatory Visit: Payer: Commercial Managed Care - HMO

## 2022-05-17 ENCOUNTER — Ambulatory Visit: Payer: Commercial Managed Care - HMO | Admitting: Family

## 2022-06-07 ENCOUNTER — Telehealth: Payer: Self-pay

## 2022-06-07 ENCOUNTER — Ambulatory Visit: Payer: Commercial Managed Care - HMO | Admitting: Internal Medicine

## 2022-06-07 NOTE — Telephone Encounter (Signed)
Called patient to reschedule missed appointment. Voicemail at only phone number listed was for a business, self-identified as an individual other than the patient. Did not leave a message.  Wyvonne Lenz, RN

## 2022-06-13 ENCOUNTER — Emergency Department (HOSPITAL_COMMUNITY)
Admission: EM | Admit: 2022-06-13 | Discharge: 2022-06-13 | Discharge status: Against Medical Advice | Payer: Commercial Managed Care - HMO | Attending: Emergency Medicine | Admitting: Emergency Medicine

## 2022-06-13 ENCOUNTER — Other Ambulatory Visit: Payer: Self-pay

## 2022-06-13 ENCOUNTER — Encounter (HOSPITAL_COMMUNITY): Payer: Self-pay | Admitting: *Deleted

## 2022-06-13 DIAGNOSIS — M79672 Pain in left foot: Secondary | ICD-10-CM | POA: Insufficient documentation

## 2022-06-13 DIAGNOSIS — M79671 Pain in right foot: Secondary | ICD-10-CM | POA: Insufficient documentation

## 2022-06-13 DIAGNOSIS — Z5321 Procedure and treatment not carried out due to patient leaving prior to being seen by health care provider: Secondary | ICD-10-CM | POA: Insufficient documentation

## 2022-06-13 NOTE — ED Provider Triage Note (Signed)
Emergency Medicine Provider Triage Evaluation Note  Wm Sahagun , a 36 y.o. male  was evaluated in triage.  Pt complains of bilateral foot pain.  Hx flat feet and has been walking a lot.  Denies new rash or open wounds to the feet.  States he has different shoes but they are not helping.  Review of Systems  Positive: Foot pain Negative: fever  Physical Exam  BP (!) 144/108   Pulse 96   Temp 97.6 F (36.4 C)   Resp 16   SpO2 99%  Gen:   Awake, no distress   Resp:  Normal effort  MSK:   Moves extremities without difficulty  Other:  Flat feet, no open wounds/sores noted to the feet, no rash, pulses intact BLE  Medical Decision Making  Medically screening exam initiated at 3:20 AM.  Appropriate orders placed.  Loring Liskey was informed that the remainder of the evaluation will be completed by another provider, this initial triage assessment does not replace that evaluation, and the importance of remaining in the ED until their evaluation is complete.  Bilateral foot pain.  Has hx flat feet and walking more than usual.   Garlon Hatchet, PA-C 06/13/22 0321

## 2022-06-13 NOTE — ED Triage Notes (Signed)
The pt is here for bi-lateral feet pain for all his life  he's homeless

## 2022-06-30 ENCOUNTER — Ambulatory Visit: Payer: Commercial Managed Care - HMO | Admitting: Physician Assistant

## 2022-07-07 ENCOUNTER — Ambulatory Visit: Payer: Commercial Managed Care - HMO | Admitting: Infectious Diseases

## 2022-08-01 ENCOUNTER — Other Ambulatory Visit: Payer: Self-pay | Admitting: Infectious Diseases

## 2022-08-01 ENCOUNTER — Telehealth: Payer: Self-pay

## 2022-08-01 DIAGNOSIS — B20 Human immunodeficiency virus [HIV] disease: Secondary | ICD-10-CM

## 2022-08-01 NOTE — Telephone Encounter (Signed)
Pt called office and left VM stating that he in need of a f/u appt ASAP due to being out medication for 4 mo. Pt also stated that he feels as he could fall out at any moment. Pt has no showed 4 prev appts, but was with diff providers. Verified w/ Pharm team if we could see him this week. Tried to call pt back on # that was left in his VM of [phone], but no VM box is set up and no MyChart is set to send MyChart msg. Will attempt to try again.

## 2022-08-03 ENCOUNTER — Encounter (HOSPITAL_COMMUNITY): Payer: Self-pay | Admitting: Emergency Medicine

## 2022-08-03 ENCOUNTER — Emergency Department (HOSPITAL_COMMUNITY)
Admission: EM | Admit: 2022-08-03 | Discharge: 2022-08-03 | Disposition: A | Discharge status: Home / Self Care | Payer: Medicaid Other | Attending: Emergency Medicine | Admitting: Emergency Medicine

## 2022-08-03 ENCOUNTER — Emergency Department (HOSPITAL_BASED_OUTPATIENT_CLINIC_OR_DEPARTMENT_OTHER)
Admit: 2022-08-03 | Discharge: 2022-08-03 | Disposition: A | Discharge status: Home / Self Care | Payer: Medicaid Other | Attending: Emergency Medicine | Admitting: Emergency Medicine

## 2022-08-03 ENCOUNTER — Emergency Department (HOSPITAL_COMMUNITY): Payer: Medicaid Other

## 2022-08-03 ENCOUNTER — Other Ambulatory Visit: Payer: Self-pay

## 2022-08-03 DIAGNOSIS — L0291 Cutaneous abscess, unspecified: Secondary | ICD-10-CM | POA: Diagnosis not present

## 2022-08-03 DIAGNOSIS — Z21 Asymptomatic human immunodeficiency virus [HIV] infection status: Secondary | ICD-10-CM | POA: Insufficient documentation

## 2022-08-03 DIAGNOSIS — M79602 Pain in left arm: Secondary | ICD-10-CM | POA: Diagnosis not present

## 2022-08-03 DIAGNOSIS — M7989 Other specified soft tissue disorders: Secondary | ICD-10-CM | POA: Diagnosis present

## 2022-08-03 LAB — CBC WITH DIFFERENTIAL/PLATELET
Abs Immature Granulocytes: 0.01 10*3/uL (ref 0.00–0.07)
Basophils Absolute: 0.1 10*3/uL (ref 0.0–0.1)
Basophils Relative: 1 %
Eosinophils Absolute: 0.6 10*3/uL — ABNORMAL HIGH (ref 0.0–0.5)
Eosinophils Relative: 10 %
HCT: 47.2 % (ref 39.0–52.0)
Hemoglobin: 15.9 g/dL (ref 13.0–17.0)
Immature Granulocytes: 0 %
Lymphocytes Relative: 40 %
Lymphs Abs: 2.5 10*3/uL (ref 0.7–4.0)
MCH: 30.6 pg (ref 26.0–34.0)
MCHC: 33.7 g/dL (ref 30.0–36.0)
MCV: 90.9 fL (ref 80.0–100.0)
Monocytes Absolute: 0.5 10*3/uL (ref 0.1–1.0)
Monocytes Relative: 9 %
Neutro Abs: 2.5 10*3/uL (ref 1.7–7.7)
Neutrophils Relative %: 40 %
Platelets: 245 10*3/uL (ref 150–400)
RBC: 5.19 MIL/uL (ref 4.22–5.81)
RDW: 12.5 % (ref 11.5–15.5)
WBC: 6.2 10*3/uL (ref 4.0–10.5)
nRBC: 0 % (ref 0.0–0.2)

## 2022-08-03 LAB — COMPREHENSIVE METABOLIC PANEL
ALT: 27 U/L (ref 0–44)
AST: 31 U/L (ref 15–41)
Albumin: 4 g/dL (ref 3.5–5.0)
Alkaline Phosphatase: 71 U/L (ref 38–126)
Anion gap: 8 (ref 5–15)
BUN: 21 mg/dL — ABNORMAL HIGH (ref 6–20)
CO2: 29 mmol/L (ref 22–32)
Calcium: 9.3 mg/dL (ref 8.9–10.3)
Chloride: 99 mmol/L (ref 98–111)
Creatinine, Ser: 0.95 mg/dL (ref 0.61–1.24)
GFR, Estimated: >60 mL/min (ref >60)
Glucose, Bld: 77 mg/dL (ref 70–99)
Potassium: 3.6 mmol/L (ref 3.5–5.1)
Sodium: 136 mmol/L (ref 135–145)
Total Bilirubin: 0.5 mg/dL (ref 0.3–1.2)
Total Protein: 7.7 g/dL (ref 6.5–8.1)

## 2022-08-03 LAB — LACTIC ACID, PLASMA: Lactic Acid, Venous: 2 mmol/L (ref 0.5–1.9)

## 2022-08-03 MED ORDER — DOXYCYCLINE HYCLATE 100 MG PO TABS
100.0000 mg | ORAL_TABLET | Freq: Once | ORAL | Status: AC
  Administered 2022-08-03: 100 mg via ORAL
  Filled 2022-08-03: qty 1

## 2022-08-03 MED ORDER — DOXYCYCLINE HYCLATE 100 MG PO CAPS: 100.0000 mg | ORAL_CAPSULE | Freq: Two times a day (BID) | ORAL | 0 refills | Status: DC

## 2022-08-03 MED ORDER — DOXYCYCLINE HYCLATE 100 MG PO CAPS: 100.0000 mg | ORAL_CAPSULE | Freq: Two times a day (BID) | ORAL | 0 refills | Status: AC

## 2022-08-03 NOTE — Discharge Instructions (Addendum)
Please follow-up with the infectious disease doctor  Your ultrasound did not show signs of blood clot in your left arm, or abscess or deep skin infection.

## 2022-08-03 NOTE — Progress Notes (Addendum)
TOC CSW spoke with pt.  Pt had questions about applying for disability.  CSW provided pt with information on where to apply.  CSW inquired about anything else she could assist with.  Pt did not have any concerns.  Kalese Ensz Tarpley-Carter, MSW, LCSW-A Pronouns:  She/Her/Hers [facility] Clinical Social Worker Direct Number:  [phone] [email or web address]

## 2022-08-03 NOTE — ED Triage Notes (Signed)
Pt reports he "did a shot in the dark and now I have an abscess."  Pt uses Meth daily.  Pt is a poor historian, does not know when first appeared. "I'm a little high."  Last used 3 hours ago. Pt has a large abscess on his left upper arm

## 2022-08-03 NOTE — ED Notes (Signed)
All discharge instructions reviewed with patient including follow up care and prescriptions. Patient verbalized understanding of same and had no other questions. Patient stable and ambulatory at time of discharge.  

## 2022-08-03 NOTE — ED Provider Notes (Signed)
[place] EMERGENCY DEPARTMENT Provider Note   CSN: [id number] Arrival date & time: 08/03/22  0626     History  Chief Complaint  Patient presents with   Abscess    Kamran Coker is a 37 y.o. male w/ hx of HIV, not on antiviral medications, IVDA, presenting to ED with pain in his left bicep.  Reports he has injected into his left brachial vein and A/C region before.  He states that he uses meth frequently, daily.  He noted painful swelling in his left bicep but does not know how long this has been ongoing for -- perhaps days or weeks.  Denies fevers/chills.  He reports he has not taken any medications in several months.  He will not clarify the reason why, but asks repeatedly to see a Education officer, museum.  He has not seen ID in several months for his HIV.  Last visit per records was 04/20/22 with Mauricio Po FNP who noted patient has chronic hepatitis C and HIV poorly controlled, but on Symtuza (prescribed), which he is not currently taking.  Patient also had history of syphillis successfully treated in the past.  HPI     Home Medications Prior to Admission medications   Medication Sig Start Date End Date Taking? Authorizing Provider  acetaminophen (TYLENOL) 500 MG tablet Take 500-1,000 mg by mouth every 6 (six) hours as needed for mild pain.    [provider]  Darunavir-Cobicistat-Emtricitabine-Tenofovir Alafenamide (SYMTUZA) 800-150-200-10 MG TABS Take 1 tablet by mouth daily with breakfast. 04/20/22   Golden Circle, FNP  doxycycline (VIBRAMYCIN) 100 MG capsule Take 1 capsule (100 mg total) by mouth 2 (two) times daily for 7 days. 08/03/22 08/10/22  Elgie Congo, MD  ibuprofen (ADVIL) 200 MG tablet Take 600 mg by mouth every 6 (six) hours as needed for mild pain.    [provider]  omeprazole (PRILOSEC) 20 MG capsule Take 1 capsule (20 mg total) by mouth daily. Patient not taking: Reported on 02/14/2019 06/24/18 02/14/19  Montine Circle, PA-C       Allergies    Patient has no known allergies.    Review of Systems   Review of Systems  Physical Exam Updated Vital Signs BP (!) 157/85 (BP Location: Right Arm)   Pulse (!) 101   Temp 98.5 F (36.9 C) (Oral)   Resp 16   SpO2 99%  Physical Exam Constitutional:      General: He is not in acute distress. HENT:     Head: Normocephalic and atraumatic.  Eyes:     Conjunctiva/sclera: Conjunctivae normal.     Pupils: Pupils are equal, round, and reactive to light.  Cardiovascular:     Rate and Rhythm: Normal rate and regular rhythm.  Pulmonary:     Effort: Pulmonary effort is normal. No respiratory distress.  Abdominal:     General: There is no distension.     Tenderness: There is no abdominal tenderness.  Musculoskeletal:     Comments: Tenderness of left bicep tendon without fluctuance or overlying erythema Swelling left bicep (minor) in comparison to right Full ROM of the left upper arm  Skin:    General: Skin is warm and dry.  Neurological:     General: No focal deficit present.     Mental Status: He is alert. Mental status is at baseline.  Psychiatric:        Mood and Affect: Mood normal.        Behavior: Behavior normal.  ED Results / Procedures / Treatments   Labs (all labs ordered are listed, but only abnormal results are displayed) Labs Reviewed  COMPREHENSIVE METABOLIC PANEL - Abnormal; Notable for the following components:      Result Value   BUN 21 (*)    All other components within normal limits  LACTIC ACID, PLASMA - Abnormal; Notable for the following components:   Lactic Acid, Venous 2.0 (*)    All other components within normal limits  CBC WITH DIFFERENTIAL/PLATELET - Abnormal; Notable for the following components:   Eosinophils Absolute 0.6 (*)    All other components within normal limits  CULTURE, BLOOD (ROUTINE X 2)    EKG None  Radiology VAS [place] UPPER EXTREMITY VENOUS DUPLEX  Result Date: 08/03/2022 UPPER VENOUS STUDY  Patient Name:   AFSHIN CHRYSTAL  Date of Exam:   08/03/2022 Medical Rec #: [id number]  Accession #:    [id number] Date of Birth: 02/25/1986  Patient Gender: M Patient Age:   29 years Exam Location:  [facility] Procedure:      VAS [place] UPPER EXTREMITY VENOUS DUPLEX Referring Phys: Abbye Lao --------------------------------------------------------------------------------  Indications: Abscess Risk Factors: None identified. Comparison Study: No prior studies. Performing Technologist: Chanda Busing RVT  Examination Guidelines: A complete evaluation includes B-mode imaging, spectral Doppler, color Doppler, and power Doppler as needed of all accessible portions of each vessel. Bilateral testing is considered an integral part of a complete examination. Limited examinations for reoccurring indications may be performed as noted.  Right Findings: +----------+------------+---------+-----------+----------+-------+ RIGHT     CompressiblePhasicitySpontaneousPropertiesSummary +----------+------------+---------+-----------+----------+-------+ Subclavian    Full       Yes       Yes                      +----------+------------+---------+-----------+----------+-------+  Left Findings: +----------+------------+---------+-----------+----------+-------+ LEFT      CompressiblePhasicitySpontaneousPropertiesSummary +----------+------------+---------+-----------+----------+-------+ IJV           Full       Yes       Yes                      +----------+------------+---------+-----------+----------+-------+ Subclavian    Full       Yes       Yes                      +----------+------------+---------+-----------+----------+-------+ Axillary      Full       Yes       Yes                      +----------+------------+---------+-----------+----------+-------+ Brachial      Full       Yes       Yes                      +----------+------------+---------+-----------+----------+-------+ Radial        Full                                           +----------+------------+---------+-----------+----------+-------+ Ulnar         Full                                          +----------+------------+---------+-----------+----------+-------+  Cephalic      Full                                          +----------+------------+---------+-----------+----------+-------+ Basilic       Full                                          +----------+------------+---------+-----------+----------+-------+  Summary:  Right: No evidence of thrombosis in the subclavian.  Left: No evidence of deep vein thrombosis in the upper extremity. No evidence of superficial vein thrombosis in the upper extremity.  *See table(s) above for measurements and observations.  Diagnosing physician: Harold Barban MD Electronically signed by Harold Barban MD on 08/03/2022 at 8:31:49 PM.    Final     Procedures Procedures    Medications Ordered in ED Medications  doxycycline (VIBRA-TABS) tablet 100 mg (100 mg Oral Given 08/03/22 2108)    ED Course/ Medical Decision Making/ A&P Clinical Course as of 08/03/22 2123  Wed Aug 03, 2022  1830 No DVT [MT]    Clinical Course User Index [MT] Wyvonnia Dusky, MD                             Medical Decision Making Amount and/or Complexity of Data Reviewed Radiology: ordered.  Risk Prescription drug management.   Left bicep pain and swelling Ddx includes tendinitis vs abscess vs other  Soft tissue ultrasound ordered and personally  reviewed and interpreted, showing no acute abnormal findings - no DVT/abscess   Labs reviewed personally, notable for WBC wnl; lactate 2.0 (nonspecific); blood cultures were ordered from triage, however the patient has no symptoms of sepsis and I have a low suspicion for acute bacteremia.  He is aware of risks of IVDA for serious infection.  Resources and counselling provided for detox centers.  TOC was consulted at the patient's request -  resources provided  No indication for hospitalization at this time.  Started on doxycycline x 1 week for tendinitis or cellulitis coverage given localized IV drug injections  I do not see evidence of abscess or surgical emergency at this time.           Final Clinical Impression(s) / ED Diagnoses Final diagnoses:  Left arm pain    Rx / DC Orders ED Discharge Orders          Ordered    doxycycline (VIBRAMYCIN) 100 MG capsule  2 times daily,   Status:  Discontinued        08/03/22 2058    doxycycline (VIBRAMYCIN) 100 MG capsule  2 times daily        08/03/22 2106              Wyvonnia Dusky, MD 08/03/22 2124

## 2022-08-03 NOTE — Progress Notes (Signed)
Left upper extremity venous duplex has been completed. Preliminary results can be found in CV Proc through chart review.  Results were given to Dr. Langston Masker.  08/03/22 6:26 PM [name] RVT

## 2022-08-05 ENCOUNTER — Encounter: Payer: Self-pay | Admitting: Physician Assistant

## 2022-08-05 ENCOUNTER — Ambulatory Visit (INDEPENDENT_AMBULATORY_CARE_PROVIDER_SITE_OTHER): Payer: Medicaid Other | Admitting: Physician Assistant

## 2022-08-05 ENCOUNTER — Ambulatory Visit (INDEPENDENT_AMBULATORY_CARE_PROVIDER_SITE_OTHER): Payer: Medicaid Other | Admitting: Pharmacist

## 2022-08-05 ENCOUNTER — Other Ambulatory Visit: Payer: Self-pay

## 2022-08-05 VITALS — BP 137/87 | HR 97 | Temp 97.9°F | Wt 194.0 lb

## 2022-08-05 DIAGNOSIS — M60022 Infective myositis, left upper arm: Secondary | ICD-10-CM | POA: Diagnosis not present

## 2022-08-05 DIAGNOSIS — B20 Human immunodeficiency virus [HIV] disease: Secondary | ICD-10-CM | POA: Diagnosis not present

## 2022-08-05 DIAGNOSIS — M79602 Pain in left arm: Secondary | ICD-10-CM | POA: Diagnosis present

## 2022-08-05 DIAGNOSIS — F1990 Other psychoactive substance use, unspecified, uncomplicated: Secondary | ICD-10-CM | POA: Diagnosis not present

## 2022-08-05 DIAGNOSIS — Z113 Encounter for screening for infections with a predominantly sexual mode of transmission: Secondary | ICD-10-CM | POA: Diagnosis not present

## 2022-08-05 MED ORDER — SYMTUZA 800-150-200-10 MG PO TABS: 1.0000 | ORAL_TABLET | Freq: Every day | ORAL | 1 refills | Status: DC

## 2022-08-05 NOTE — Progress Notes (Signed)
HPI: [name] is a 37 y.o. male who presents to the [facility] for HIV follow-up.  Patient Active Problem List   Diagnosis Date Noted   Healthcare maintenance 04/20/2022   Homeless 05/03/2021   Syphilis 05/03/2021   Closed fracture of body of mandible ([place])    Syncope and collapse    Fall 10/15/2020   Chronic hepatitis C without hepatic coma ([place]) 05/12/2020   Methamphetamine intoxication ([place]) 12/20/2019   AKI (acute kidney injury) ([place]) 12/20/2019   Elevated transaminase level 12/20/2019   Elevated CK    Methamphetamine use disorder, severe, dependence ([place]) 07/10/2019   MDD (major depressive disorder), recurrent severe, without psychosis ([place]) 07/09/2019   HIV (human immunodeficiency virus infection) ([place]) 06/05/2019    Patient's Medications  New Prescriptions   DARUNAVIR-COBICISTAT-EMTRICITABINE-TENOFOVIR ALAFENAMIDE (SYMTUZA) 800-150-200-10 MG TABS    Take 1 tablet by mouth daily with breakfast.  Previous Medications   ACETAMINOPHEN (TYLENOL) 500 MG TABLET    Take 500-1,000 mg by mouth every 6 (six) hours as needed for mild pain.   DARUNAVIR-COBICISTAT-EMTRICITABINE-TENOFOVIR ALAFENAMIDE (SYMTUZA) 800-150-200-10 MG TABS    Take 1 tablet by mouth daily with breakfast.   DOXYCYCLINE (VIBRAMYCIN) 100 MG CAPSULE    Take 1 capsule (100 mg total) by mouth 2 (two) times daily for 7 days.   IBUPROFEN (ADVIL) 200 MG TABLET    Take 600 mg by mouth every 6 (six) hours as needed for mild pain.  Modified Medications   No medications on file  Discontinued Medications   No medications on file    Allergies: No Known Allergies  Past Medical History: Past Medical History:  Diagnosis Date   ADHD (attention deficit hyperactivity disorder)    Chronic hepatitis C without hepatic coma ([place]) 05/12/2020   Drug abuse (HCC)    meth, marijuana   HIV positive ([place])     Social History: Social History   Socioeconomic History   Marital status: Single    Spouse name: Not on  file   Number of children: Not on file   Years of education: Not on file   Highest education level: Not on file  Occupational History   Not on file  Tobacco Use   Smoking status: Every Day    Packs/day: 1.00    Years: 10.00    Total pack years: 10.00    Types: Cigarettes   Smokeless tobacco: Never  Vaping Use   Vaping Use: Never used  Substance and Sexual Activity   Alcohol use: Not Currently   Drug use: Yes    Types: Methamphetamines    Comment: last used 04/20/22 (IV and smoked)   Sexual activity: Yes    Birth control/protection: None    Comment: offered condoms  Other Topics Concern   Not on file  Social History Narrative   Not on file   Social Determinants of Health   Financial Resource Strain: Not on file  Food Insecurity: Not on file  Transportation Needs: Not on file  Physical Activity: Not on file  Stress: Not on file  Social Connections: Not on file    Labs: Lab Results  Component Value Date   HIV1RNAQUANT 44 (H) 05/03/2021   HIV1RNAQUANT 94,200 (H) 03/16/2021   HIV1RNAQUANT 371 (H) 05/12/2020   CD4TABS 600 03/16/2021   CD4TABS 569 05/12/2020   CD4TABS 403 01/13/2020    RPR and STI Lab Results  Component Value Date   LABRPR REACTIVE (A) 03/16/2021   LABRPR Reactive (A) 10/15/2020   LABRPR Reactive (  A) 07/30/2020   LABRPR REACTIVE (A) 05/12/2020   LABRPR REACTIVE (A) 01/13/2020   RPRTITER 1:8 (H) 03/16/2021   RPRTITER 1:2 (H) 05/12/2020   RPRTITER 1:2 (H) 01/13/2020   RPRTITER 1:4 (H) 10/09/2019   RPRTITER 1:32 (A) 01/26/2014    STI Results GC GC CT CT  Latest Ref Rng & Units  NEGATIVE  NEGATIVE  10/15/2020  8:43 PM Negative   Negative    05/12/2020 10:42 AM Negative   Negative    01/13/2020  9:13 AM Negative    Negative    Negative   Negative    Positive    Negative    10/09/2019 10:24 AM Negative   Negative    02/24/2019 12:00 AM Negative   Negative    06/06/2018 12:00 AM Negative   Negative    10/14/2017 12:00 AM Negative    Negative    01/26/2014  1:45 PM  NEGATIVE   NEGATIVE   07/23/2010  6:11 PM   NEGATIVE (NOTE)  Testing performed using the BD ProbeTec Qx Chlamydia trachomatis and Neisseria gonorrhea amplified DNA assay.  Performed at:  [facility] [address], [place] [address]               [id number]    12/09/2009  1:10 AM   NEGATIVE (NOTE)  Testing performed using the BD Probetec ET Chlamydia trachomatis and Neisseria gonorrhea amplified DNA assay.    03/08/2007  3:27 AM   NEGATIVE (NOTE)  Testing performed using the BD Probetec ET Chlamydia trachomatis and Neisseria gonorrhea amplified DNA assay.      Hepatitis B Lab Results  Component Value Date   HEPBSAB REACTIVE (A) 10/09/2019   HEPBSAG NON-REACTIVE 10/09/2019   HEPBCAB NON-REACTIVE 10/09/2019   Hepatitis C Lab Results  Component Value Date   HEPCAB REACTIVE (A) 10/09/2019   HCVRNAPCRQN 1,810,000 (H) 03/16/2021   Hepatitis A Lab Results  Component Value Date   HAV NON-REACTIVE 10/09/2019   Lipids: Lab Results  Component Value Date   CHOL 115 10/09/2019   TRIG 103 10/09/2019   HDL 33 (L) 10/09/2019   CHOLHDL 3.5 10/09/2019   VLDL 31 07/10/2019   LDLCALC 63 10/09/2019    Current HIV Regimen: None since October 2023  Assessment: [name] presents to clinic today for HIV follow-up. He states he never started taking Symtuza when [place] prescribed it in October and did not know he had a new medication to try. He asked multiple times how long it takes for someone to develop complications or "go out" with HIV if they do not take medications. Discussed that this is individualized for each patient and can take months to years. Reviewed that HIV can lead to inflammation and other complications that may or may not be reversible and that it can suppress his immune system leading to higher susceptible to infections that may have a detrimental, irreversible  impact on his health. Reviewed that he needs to take Symtuza longer than one week as it may take a few weeks to adjust in his system. He states he will pick it up at [facility] on [address] today. Will check HIV RNA along with RPR and lipid panel. CMP and CBC were recently checked  at his ED visit on 08/03/22.  Vaccines were not discussed at this visit. He met with Tresa Endo today to discuss ongoing arm pain from recently "shooting up". He will follow-up with Tammy Sours in 1 month.  Plan: Check HIV RNA, RPR, and lipid panel Start Symtuza Follow-up with Tammy Sours on 09/08/22  Margarite Gouge, PharmD, CPP, BCIDP, AAHIVP Clinical Pharmacist Practitioner Infectious Diseases Clinical Pharmacist [facility] 08/05/2022, 11:37 AM

## 2022-08-05 NOTE — Progress Notes (Addendum)
Subjective:    Patient ID: [name], male    DOB: Jul 29, 1985, 37 y.o.   MRN: [id number]  Chief Complaint  Patient presents with   Follow-up    Concerns for left arm after shooting drugs in vein in the dark. Has not picked up antibiotics prescribed by ED      HPI:  [name] is a 37 y.o. male presenting today for left upper arm tenderness x 1 week after "shooting up in the dark".  Pain began 24 hours after injection. Went to ED 08/03/22 [place] of effected area showed no abscess or DVT.  Prescribed doxy, however he has not picked this medication up due to cost $4.00. He denies redness at injection site, streaking, discharge, lesion.  Pain is worse with lifting with left arm at bicep, though range of motion is limited due to pain. Denies fever, n/v/d/c. He has been using clean needles from needle bank for his drug use.    No Known Allergies    Outpatient Medications Prior to Visit  Medication Sig Dispense Refill   acetaminophen (TYLENOL) 500 MG tablet Take 500-1,000 mg by mouth every 6 (six) hours as needed for mild pain.     Darunavir-Cobicistat-Emtricitabine-Tenofovir Alafenamide (SYMTUZA) 800-150-200-10 MG TABS Take 1 tablet by mouth daily with breakfast. 30 tablet 2   Darunavir-Cobicistat-Emtricitabine-Tenofovir Alafenamide (SYMTUZA) 800-150-200-10 MG TABS Take 1 tablet by mouth daily with breakfast. 30 tablet 1   doxycycline (VIBRAMYCIN) 100 MG capsule Take 1 capsule (100 mg total) by mouth 2 (two) times daily for 7 days. 14 capsule 0   ibuprofen (ADVIL) 200 MG tablet Take 600 mg by mouth every 6 (six) hours as needed for mild pain.     No facility-administered medications prior to visit.     Past Medical History:  Diagnosis Date   ADHD (attention deficit hyperactivity disorder)    Chronic hepatitis C without hepatic coma ([employer]) 05/12/2020   Drug abuse (HCC)    meth, marijuana   HIV positive ([place])      Past Surgical History:  Procedure Laterality Date   ORIF MANDIBULAR  FRACTURE Right 10/16/2020   Procedure: OPEN REDUCTION INTERNAL FIXATION (ORIF) SUBCONDYLAR FRACTURE WITH REPAIR OF 3CM CHIN LACERATION;  Surgeon: Ronal Fear, MD;  Location: [facility];  Service: Oral Surgery;  Laterality: Right;       Review of Systems  Constitutional:  Negative for chills, fatigue and fever.  HENT: Negative.    Respiratory: Negative.    Cardiovascular: Negative.   Musculoskeletal:  Positive for myalgias (left bicep). Negative for joint swelling.  Skin:  Negative for rash and wound.  Hematological:  Does not bruise/bleed easily.  Psychiatric/Behavioral:  Positive for sleep disturbance. Negative for agitation, behavioral problems, dysphoric mood and hallucinations.       Objective:    BP 137/87   Pulse 97   Temp 97.9 F (36.6 C) (Oral)   Wt 194 lb (88 kg)   SpO2 99%   BMI 25.60 kg/m  Nursing note and vital signs reviewed.  Physical Exam Vitals reviewed.  Constitutional:      Appearance: Normal appearance. He is normal weight.  HENT:     Head: Normocephalic and atraumatic.  Eyes:     Extraocular Movements: Extraocular movements intact.     Conjunctiva/sclera: Conjunctivae normal.     Pupils: Pupils are equal, round, and reactive to light.  Cardiovascular:     Rate and Rhythm: Normal rate and regular rhythm.  Pulmonary:     Effort: Pulmonary  effort is normal.     Breath sounds: Normal breath sounds.  Musculoskeletal:       Arms:     Comments: Left bicep appears swollen, warm, no erythema, lesion.  Full active ROM with flexion/ext, pronation and suppination. Passive FROM pain with bicep flexion against resistance. Wrist and neck exam unremarkable.  Skin:    General: Skin is warm.     Findings: No erythema, lesion or rash.  Neurological:     General: No focal deficit present.     Mental Status: He is alert and oriented to person, place, and time.  Psychiatric:        Mood and Affect: Mood normal.        Behavior: Behavior normal.         Thought Content: Thought content normal.        Judgment: Judgment normal.         04/20/2022    2:22 PM 05/12/2020   10:25 AM 01/13/2020    8:51 AM  Depression screen PHQ 2/9  Decreased Interest 1 0 0  Down, Depressed, Hopeless 1 0 1  PHQ - 2 Score 2 0 1  Altered sleeping 0    Tired, decreased energy 3    Change in appetite 1    Feeling bad or failure about yourself  0    Trouble concentrating 3    Moving slowly or fidgety/restless 1    Suicidal thoughts 0    PHQ-9 Score 10    Difficult doing work/chores Somewhat difficult         Assessment & Plan:  Reviewed ED visit 08/03/22-US left UE impression: myositis, with intramuscular edema and developing infectious /inflammatory process and edema. Provided compression wrap with cool pack, bottle of ibuprofen 200 mg, take 3 tabs every 6 hours for swelling Doxycycline to be picked up for $4 [facility]-reconnected-barriers IVDU, housing will discuss with Terrence Dupont Continue using clean needles from needle bank  Patient Active Problem List   Diagnosis Date Noted   Healthcare maintenance 04/20/2022   Homeless 05/03/2021   Syphilis 05/03/2021   Closed fracture of body of mandible ([place])    Syncope and collapse    Fall 10/15/2020   Chronic hepatitis C without hepatic coma ([place]) 05/12/2020   Methamphetamine intoxication ([place]) 12/20/2019   AKI (acute kidney injury) ([place]) 12/20/2019   Elevated transaminase level 12/20/2019   Elevated CK    Methamphetamine use disorder, severe, dependence ([place]) 07/10/2019   MDD (major depressive disorder), recurrent severe, without psychosis ([place]) 07/09/2019   HIV (human immunodeficiency virus infection) ([place]) 06/05/2019     Problem List Items Addressed This Visit       Other   HIV (human immunodeficiency virus infection) ([place])   Relevant Orders   AMB REFERRAL TO [facility]   Other Visit Diagnoses     Arm pain, musculoskeletal, left    -  Primary   IVDU (intravenous drug user)        Relevant Orders   AMB REFERRAL TO [facility]   Infective myositis of left upper arm            I am having Cindee Salt Sewell maintain his ibuprofen, acetaminophen, Symtuza, doxycycline, and Symtuza.   No orders of the defined types were placed in this encounter.    Follow-up: Return if symptoms worsen or fail to improve.

## 2022-08-05 NOTE — Patient Instructions (Addendum)
Keep cold pack and compression with ace wrap Pick up doxycycline  If symptoms change or worsen return to clinic Continue using clean needles Ibuprofen 600 mg every 6 hours Appears muscle is swollen and inflamed, no evidence of phlebitis at injection site

## 2022-08-08 LAB — RPR TITER: RPR Titer: 1:2 {titer} — ABNORMAL HIGH

## 2022-08-08 LAB — T PALLIDUM AB: T Pallidum Abs: POSITIVE — AB

## 2022-08-08 LAB — LIPID PANEL
Cholesterol: 137 mg/dL (ref <200)
HDL: 40 mg/dL (ref >=40)
LDL Cholesterol (Calc): 71 mg/dL (calc)
Non-HDL Cholesterol (Calc): 97 mg/dL (calc) (ref <130)
Total CHOL/HDL Ratio: 3.4 (calc) (ref <5.0)
Triglycerides: 185 mg/dL — ABNORMAL HIGH (ref <150)

## 2022-08-08 LAB — CULTURE, BLOOD (ROUTINE X 2)
Culture: NO GROWTH
Special Requests: ADEQUATE

## 2022-08-08 LAB — HIV-1 RNA QUANT-NO REFLEX-BLD
HIV 1 RNA Quant: 6090 Copies/mL — ABNORMAL HIGH
HIV-1 RNA Quant, Log: 3.78 Log cps/mL — ABNORMAL HIGH

## 2022-08-08 LAB — RPR: RPR Ser Ql: REACTIVE — AB

## 2022-08-08 NOTE — Progress Notes (Signed)
FYI - has been off of medication - [name]

## 2022-08-18 ENCOUNTER — Emergency Department (HOSPITAL_COMMUNITY)
Admission: EM | Admit: 2022-08-18 | Discharge: 2022-08-18 | Disposition: A | Discharge status: Home / Self Care | Payer: Medicaid Other

## 2022-08-26 ENCOUNTER — Emergency Department (HOSPITAL_COMMUNITY)
Admission: EM | Admit: 2022-08-26 | Discharge: 2022-08-27 | Discharge status: Against Medical Advice | Payer: Medicaid Other | Attending: Emergency Medicine | Admitting: Emergency Medicine

## 2022-08-26 ENCOUNTER — Other Ambulatory Visit: Payer: Self-pay

## 2022-08-26 ENCOUNTER — Encounter (HOSPITAL_COMMUNITY): Payer: Self-pay | Admitting: Emergency Medicine

## 2022-08-26 DIAGNOSIS — M79671 Pain in right foot: Secondary | ICD-10-CM | POA: Diagnosis present

## 2022-08-26 DIAGNOSIS — M79672 Pain in left foot: Secondary | ICD-10-CM | POA: Insufficient documentation

## 2022-08-26 DIAGNOSIS — Z5900 Homelessness unspecified: Secondary | ICD-10-CM | POA: Diagnosis not present

## 2022-08-26 DIAGNOSIS — Z5321 Procedure and treatment not carried out due to patient leaving prior to being seen by health care provider: Secondary | ICD-10-CM | POA: Insufficient documentation

## 2022-08-26 NOTE — ED Notes (Signed)
Denies need for pain medicine. Pt to lobby at this time

## 2022-08-26 NOTE — ED Triage Notes (Signed)
Pt c/o bilateral feet pain. Endorses cold exposure, lots of walking, and homelessness. Pt ambulatory in triage. No injury/trauma.

## 2022-08-26 NOTE — ED Notes (Signed)
Pt called for room, no response

## 2022-09-08 ENCOUNTER — Ambulatory Visit: Payer: Medicaid Other | Admitting: Family

## 2022-09-11 ENCOUNTER — Encounter (HOSPITAL_COMMUNITY): Payer: Self-pay | Admitting: Emergency Medicine

## 2022-09-11 ENCOUNTER — Other Ambulatory Visit: Payer: Self-pay

## 2022-09-11 ENCOUNTER — Emergency Department (HOSPITAL_COMMUNITY)
Admission: EM | Admit: 2022-09-11 | Discharge: 2022-09-11 | Disposition: A | Discharge status: Home / Self Care | Payer: Medicaid Other | Attending: Emergency Medicine | Admitting: Emergency Medicine

## 2022-09-11 DIAGNOSIS — Z21 Asymptomatic human immunodeficiency virus [HIV] infection status: Secondary | ICD-10-CM | POA: Diagnosis not present

## 2022-09-11 DIAGNOSIS — Z5902 Unsheltered homelessness: Secondary | ICD-10-CM | POA: Diagnosis not present

## 2022-09-11 DIAGNOSIS — M79671 Pain in right foot: Secondary | ICD-10-CM | POA: Diagnosis present

## 2022-09-11 DIAGNOSIS — M2141 Flat foot [pes planus] (acquired), right foot: Secondary | ICD-10-CM | POA: Diagnosis not present

## 2022-09-11 DIAGNOSIS — M2142 Flat foot [pes planus] (acquired), left foot: Secondary | ICD-10-CM | POA: Insufficient documentation

## 2022-09-11 DIAGNOSIS — Z5900 Homelessness unspecified: Secondary | ICD-10-CM

## 2022-09-11 NOTE — ED Provider Notes (Signed)
[facility] Provider Note   CSN: [id number] Arrival date & time: 09/11/22  [id number]     History  Chief Complaint  Patient presents with   feet pain    [name] is a 37 y.o. male.  HPI     This is a 37 year old male who presents with bilateral foot pain.  He is currently homeless.  He states that he has been walking all night and has significant pain in his feet.  He is wearing new shoes.  Denies that his feet are wet.  Has not noted any lesions.  Denies any injury.  Home Medications Prior to Admission medications   Medication Sig Start Date End Date Taking? Authorizing Provider  acetaminophen (TYLENOL) 500 MG tablet Take 500-1,000 mg by mouth every 6 (six) hours as needed for mild pain.    [provider]  Darunavir-Cobicistat-Emtricitabine-Tenofovir Alafenamide [facility]) 800-150-200-10 MG TABS Take 1 tablet by mouth daily with breakfast. 04/20/22   Golden Circle, FNP  Darunavir-Cobicistat-Emtricitabine-Tenofovir Alafenamide (SYMTUZA) 800-150-200-10 MG TABS Take 1 tablet by mouth daily with breakfast. Patient not taking: Reported on 08/26/2022 08/05/22   Esmond Plants, RPH-CPP  omeprazole (PRILOSEC) 20 MG capsule Take 1 capsule (20 mg total) by mouth daily. Patient not taking: Reported on 02/14/2019 06/24/18 02/14/19  Montine Circle, PA-C      Allergies    Patient has no known allergies.    Review of Systems   Review of Systems  Musculoskeletal:        Foot pain  All other systems reviewed and are negative.   Physical Exam Updated Vital Signs BP (!) 136/98 (BP Location: Right Arm)   Pulse 98   Temp (!) 97.5 F (36.4 C) (Oral)   Resp 16   Ht 1.854 m ('6\' 1"'$ )   Wt 81.6 kg   SpO2 98%   BMI 23.75 kg/m  Physical Exam Vitals and nursing note reviewed.  Constitutional:      Appearance: He is well-developed. He is not ill-appearing.  HENT:     Head: Normocephalic and atraumatic.  Eyes:     Pupils: Pupils are  equal, round, and reactive to light.  Cardiovascular:     Rate and Rhythm: Normal rate and regular rhythm.  Pulmonary:     Effort: Pulmonary effort is normal. No respiratory distress.  Abdominal:     Palpations: Abdomen is soft.     Tenderness: There is no abdominal tenderness.  Musculoskeletal:     Cervical back: Neck supple.     Comments: Focused examination of the bilateral feet, patient with notable pes planus, no lesions or ulcerations noted, feet appear well-perfused with 2+ DP pulses bilaterally, no deformities noted  Lymphadenopathy:     Cervical: No cervical adenopathy.  Skin:    General: Skin is warm and dry.  Neurological:     Mental Status: He is alert and oriented to person, place, and time.  Psychiatric:        Mood and Affect: Mood normal.     ED Results / Procedures / Treatments   Labs (all labs ordered are listed, but only abnormal results are displayed) Labs Reviewed - No data to display  EKG None  Radiology No results found.  Procedures Procedures    Medications Ordered in ED Medications - No data to display  ED Course/ Medical Decision Making/ A&P  Medical Decision Making  This patient presents to the ED for concern of foot pain, this involves an extensive number of treatment options, and is a complaint that carries with it a high risk of complications and morbidity.  I considered the following differential and admission for this acute, potentially life threatening condition.  The differential diagnosis includes pes planus, injury, overexertion  MDM:    This is a 37 year old male who presents with bilateral foot pain.  He is nontoxic and vital signs are reassuring.  Foot exam is normal with exception of notable pes planus.  He also reports wearing new shoes.  He is currently homeless.  Feel that his foot pain is likely related to both his flatfeet and excessive walking.  Discussed with him wearing good fitting shoes.  He  will also be provided with shelter resources.  (Labs, imaging, consults)  Labs: I Ordered, and personally interpreted labs.  The pertinent results include: None  Imaging Studies ordered: I ordered imaging studies including none I independently visualized and interpreted imaging. I agree with the radiologist interpretation  Additional history obtained from chart review.  External records from outside source obtained and reviewed including prior evaluations  Cardiac Monitoring: The patient was not maintained on a cardiac monitor.  If on the cardiac monitor, I personally viewed and interpreted the cardiac monitored which showed an underlying rhythm of: N/A  Reevaluation: After the interventions noted above, I reevaluated the patient and found that they have :improved  Social Determinants of Health:  homeless  Disposition: Discharge  Co morbidities that complicate the patient evaluation  Past Medical History:  Diagnosis Date   ADHD (attention deficit hyperactivity disorder)    Chronic hepatitis C without hepatic coma ([place]) 05/12/2020   Drug abuse ([place])    meth, marijuana   HIV positive ([place])      Medicines No orders of the defined types were placed in this encounter.   I have reviewed the patients home medicines and have made adjustments as needed  Problem List / ED Course: Problem List Items Addressed This Visit       Other   Homeless   Other Visit Diagnoses     Pes planus of both feet    -  Primary                   Final Clinical Impression(s) / ED Diagnoses Final diagnoses:  Pes planus of both feet  Homeless    Rx / DC Orders ED Discharge Orders     None         Inara Dike, Barbette Hair, MD 09/11/22 [phone]

## 2022-09-11 NOTE — Discharge Instructions (Signed)
You were seen today for pain in your feet.  This is likely related to being flat-footed and walking for prolonged period of time.  Make sure that you are wearing good fitting shoes.  See resources provided for homeless shelters.

## 2022-09-11 NOTE — ED Triage Notes (Signed)
Pt c/o bilateral foot pain.  States he has been walking all night and is cold.

## 2022-09-16 ENCOUNTER — Ambulatory Visit: Payer: Medicaid Other | Admitting: Family

## 2022-10-05 ENCOUNTER — Ambulatory Visit: Payer: Medicaid Other | Admitting: Family

## 2022-10-10 ENCOUNTER — Other Ambulatory Visit: Payer: Self-pay

## 2022-10-10 ENCOUNTER — Ambulatory Visit (INDEPENDENT_AMBULATORY_CARE_PROVIDER_SITE_OTHER): Payer: Medicaid Other | Admitting: Family

## 2022-10-10 ENCOUNTER — Encounter: Payer: Self-pay | Admitting: Family

## 2022-10-10 VITALS — BP 138/86 | HR 95 | Temp 97.9°F | Resp 16 | Ht 73.0 in | Wt 183.0 lb

## 2022-10-10 DIAGNOSIS — F1520 Other stimulant dependence, uncomplicated: Secondary | ICD-10-CM | POA: Diagnosis not present

## 2022-10-10 DIAGNOSIS — Z0000 Encounter for general adult medical examination without abnormal findings: Secondary | ICD-10-CM | POA: Diagnosis not present

## 2022-10-10 DIAGNOSIS — A539 Syphilis, unspecified: Secondary | ICD-10-CM

## 2022-10-10 DIAGNOSIS — Z21 Asymptomatic human immunodeficiency virus [HIV] infection status: Secondary | ICD-10-CM

## 2022-10-10 DIAGNOSIS — B182 Chronic viral hepatitis C: Secondary | ICD-10-CM

## 2022-10-10 MED ORDER — SYMTUZA 800-150-200-10 MG PO TABS: 1.0000 | ORAL_TABLET | Freq: Every day | ORAL | 3 refills | Status: DC

## 2022-10-10 NOTE — Patient Instructions (Addendum)
Nice to see you.  We will check your lab work today.  Please call [facility]) to schedule/follow up on your dental care at [phone]  Continue to take your medication daily as prescribed.  Refills have been sent to the pharmacy.  Plan for follow up in 1 months or sooner if needed with lab work on the same day.  Have a great day and stay safe!

## 2022-10-10 NOTE — Assessment & Plan Note (Signed)
Previous titer down to 1:2 from 1:8. Recheck RPR.

## 2022-10-10 NOTE — Assessment & Plan Note (Signed)
[name] has Genotype 1a chronic Hepatitis C with risk factor of drug use. Currently asymptomatic and treatment naive. At high risk for re-infection with continued drug use. Check Hepatitis C and Hepatitis B lab work today. Treatment pending lab work results with Raeanne Gathers.

## 2022-10-10 NOTE — Assessment & Plan Note (Signed)
[name] has poorly controlled virus with less than optimal adherence and good tolerance to Symtuza. Reviewed previous lab work and discussed importance of taking medication on a daily basis to reduce risk of disease progression and complications in the future. Reviewed U=U. Introduced [place] however he is not an ideal candidate at this time. Check lab work include Genotype. Has active Medicaid. Continue current dose of Symtuza. Plan for follow up in 1 month or sooner if needed.

## 2022-10-10 NOTE — Assessment & Plan Note (Signed)
Larnce continues to use methamphetamines regularly which unfortunately is contributing to his poorly controlled virus. Appears to have some insight about rehabilitation but is in the pre-contemplation stage of change and not ready to quit at this time. Will ensure resources are available and will connect with [facility] for assistance.

## 2022-10-10 NOTE — Assessment & Plan Note (Signed)
Discussed importance of safe sexual practice and condom use. Condoms and STD testing offered.  Dental contact provided in AVS. Unfortunately he has missed several appointments.

## 2022-10-10 NOTE — Progress Notes (Signed)
Brief Narrative   Patient ID: [name], male    DOB: [birth year], 37 y.o.   MRN: [id number]  Mr. [name] is a 37 y/o male diagnosed with HIV disease in June 2020 with risk factor of drug use and sexual contact. Entered care in June 2021. Initial viral load was 1.47 million with CD4 count 588. Genosure with associated mutations of E35D, A71V, V179I, I178L, Q207E with no noted medication resistant mutations. No history of opportunistic infection. [id number] negative. ART history with [facility] and now [place].   Subjective:    Chief Complaint  Patient presents with   Follow-up    B20     HPI:  [name] is a 38 y.o. male with HIV disease last seen by Alfonse Spruce, PharmD, CPP on 08/05/22 with poorly controlled virus having not started previous [place]. Viral load was 6090 with previous CD4 count 600. RPR titer was down to 1:2 from 1:8. Has missed/rescheduled several appointments. Here today for follow up.  [name] has been out of medication for at least the last 3 weeks when he completed the previous samples and "forgot" his medication was at the pharmacy. No adverse side effects when taking the medication and missed 1-2 doses every few weeks. Has concern about his teeth and would like to see the dentist. Curious about long acting injectable medication and getting connected to resources. Continues to use methamphetamine. Condoms and STD testing offered.   Denies fevers, chills, night sweats, headaches, changes in vision, neck pain/stiffness, nausea, diarrhea, vomiting, lesions or rashes.  No Known Allergies    Outpatient Medications Prior to Visit  Medication Sig Dispense Refill   acetaminophen (TYLENOL) 500 MG tablet Take 500-1,000 mg by mouth every 6 (six) hours as needed for mild pain.     Darunavir-Cobicistat-Emtricitabine-Tenofovir Alafenamide (SYMTUZA) 800-150-200-10 MG TABS Take 1 tablet by mouth daily with breakfast. (Patient not taking: Reported on 10/10/2022) 30 tablet 2    Darunavir-Cobicistat-Emtricitabine-Tenofovir Alafenamide (SYMTUZA) 800-150-200-10 MG TABS Take 1 tablet by mouth daily with breakfast. (Patient not taking: Reported on 08/26/2022) 30 tablet 1   No facility-administered medications prior to visit.     Past Medical History:  Diagnosis Date   ADHD (attention deficit hyperactivity disorder)    Chronic hepatitis C without hepatic coma ([place]) 05/12/2020   Drug abuse (HCC)    meth, marijuana   HIV positive ([place])      Past Surgical History:  Procedure Laterality Date   ORIF MANDIBULAR FRACTURE Right 10/16/2020   Procedure: OPEN REDUCTION INTERNAL FIXATION (ORIF) SUBCONDYLAR FRACTURE WITH REPAIR OF 3CM CHIN LACERATION;  Surgeon: Ronal Fear, MD;  Location: [place];  Service: Oral Surgery;  Laterality: Right;      Review of Systems  Constitutional:  Negative for appetite change, chills, fatigue, fever and unexpected weight change.  Eyes:  Negative for visual disturbance.  Respiratory:  Negative for cough, chest tightness, shortness of breath and wheezing.   Cardiovascular:  Negative for chest pain and leg swelling.  Gastrointestinal:  Negative for abdominal pain, constipation, diarrhea, nausea and vomiting.  Genitourinary:  Negative for dysuria, flank pain, frequency, genital sores, hematuria and urgency.  Skin:  Negative for rash.  Allergic/Immunologic: Negative for immunocompromised state.  Neurological:  Negative for dizziness and headaches.      Objective:    Resp 16   Ht 6\' 1"  (1.854 m)   Wt 183 lb (83 kg)   BMI 24.14 kg/m  Nursing note and vital signs reviewed.  Physical Exam Constitutional:  General: He is not in acute distress.    Appearance: He is well-developed.  Eyes:     Conjunctiva/sclera: Conjunctivae normal.  Cardiovascular:     Rate and Rhythm: Normal rate and regular rhythm.     Heart sounds: Normal heart sounds. No murmur heard.    No friction rub. No gallop.  Pulmonary:     Effort: Pulmonary  effort is normal. No respiratory distress.     Breath sounds: Normal breath sounds. No wheezing or rales.  Chest:     Chest wall: No tenderness.  Abdominal:     General: Bowel sounds are normal.     Palpations: Abdomen is soft.     Tenderness: There is no abdominal tenderness.  Musculoskeletal:     Cervical back: Neck supple.  Lymphadenopathy:     Cervical: No cervical adenopathy.  Skin:    General: Skin is warm and dry.     Findings: No rash.  Neurological:     Mental Status: He is alert and oriented to person, place, and time.  Psychiatric:        Behavior: Behavior normal.        Thought Content: Thought content normal.        Judgment: Judgment normal.         04/20/2022    2:22 PM 05/12/2020   10:25 AM 01/13/2020    8:51 AM  Depression screen PHQ 2/9  Decreased Interest 1 0 0  Down, Depressed, Hopeless 1 0 1  PHQ - 2 Score 2 0 1  Altered sleeping 0    Tired, decreased energy 3    Change in appetite 1    Feeling bad or failure about yourself  0    Trouble concentrating 3    Moving slowly or fidgety/restless 1    Suicidal thoughts 0    PHQ-9 Score 10    Difficult doing work/chores Somewhat difficult         Assessment & Plan:    Patient Active Problem List   Diagnosis Date Noted   Healthcare maintenance 04/20/2022   Homeless 05/03/2021   Syphilis 05/03/2021   Closed fracture of body of mandible ([place])    Syncope and collapse    Fall 10/15/2020   Chronic hepatitis C without hepatic coma ([place]) 05/12/2020   Methamphetamine intoxication ([place]) 12/20/2019   AKI (acute kidney injury) ([place]) 12/20/2019   Elevated transaminase level 12/20/2019   Elevated CK    Methamphetamine use disorder, severe, dependence ([place]) 07/10/2019   MDD (major depressive disorder), recurrent severe, 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   No orders of the defined types were placed in this encounter.    Follow-up: No follow-ups on file.   Terri Piedra, MSN, FNP-C Nurse Practitioner [facility] [place] number: [phone]

## 2022-10-11 LAB — T-HELPER CELL (CD4) - (RCID CLINIC ONLY)
CD4 % Helper T Cell: 47 % (ref 33–65)
CD4 T Cell Abs: 755 /uL (ref 400–1790)

## 2022-10-21 ENCOUNTER — Emergency Department (HOSPITAL_COMMUNITY)
Admission: EM | Admit: 2022-10-21 | Discharge: 2022-10-21 | Discharge status: Against Medical Advice | Payer: Medicaid Other | Attending: Emergency Medicine | Admitting: Emergency Medicine

## 2022-10-21 DIAGNOSIS — R220 Localized swelling, mass and lump, head: Secondary | ICD-10-CM | POA: Diagnosis not present

## 2022-10-21 DIAGNOSIS — Z5321 Procedure and treatment not carried out due to patient leaving prior to being seen by health care provider: Secondary | ICD-10-CM | POA: Diagnosis not present

## 2022-10-21 NOTE — ED Provider Triage Note (Cosign Needed)
Emergency Medicine Provider Triage Evaluation Note Patient was not evaluated as he had eloped from triage before being seen.   Netta Corrigan, PA-C 10/21/22 1527

## 2022-10-21 NOTE — ED Triage Notes (Addendum)
Pt c/o left mandible pain and swelling. Denies fever, chills. Unknown symptom onset. Pt reports current meth use.   Pt eloped during triage before completion.

## 2022-10-21 NOTE — ED Notes (Signed)
Pt called for room, no response. Notified by triage staff that pt stated he was leaving.

## 2022-10-23 LAB — BASIC METABOLIC PANEL WITH GFR
BUN: 12 mg/dL (ref 7–25)
CO2: 28 mmol/L (ref 20–32)
Calcium: 9.6 mg/dL (ref 8.6–10.3)
Chloride: 104 mmol/L (ref 98–110)
Creat: 1.1 mg/dL (ref 0.60–1.26)
Glucose, Bld: 87 mg/dL (ref 65–99)
Potassium: 3.6 mmol/L (ref 3.5–5.3)
Sodium: 140 mmol/L (ref 135–146)
eGFR: 89 mL/min/{1.73_m2} (ref >=60)

## 2022-10-23 LAB — HIV-1 INTEGRASE GENOTYPE

## 2022-10-23 LAB — HIV RNA, RTPCR W/R GT (RTI, PI,INT)
HIV 1 RNA Quant: 508 copies/mL — ABNORMAL HIGH
HIV-1 RNA Quant, Log: 2.71 Log copies/mL — ABNORMAL HIGH

## 2022-10-23 LAB — HIV-1 GENOTYPE: HIV-1 Genotype: DETECTED — AB

## 2022-10-23 LAB — HEPATIC FUNCTION PANEL
AG Ratio: 1.4 (calc) (ref 1.0–2.5)
ALT: 14 U/L (ref 9–46)
AST: 23 U/L (ref 10–40)
Albumin: 4.6 g/dL (ref 3.6–5.1)
Alkaline phosphatase (APISO): 61 U/L (ref 36–130)
Bilirubin, Direct: 0.1 mg/dL (ref 0.0–0.2)
Globulin: 3.2 g/dL (calc) (ref 1.9–3.7)
Indirect Bilirubin: 0.6 mg/dL (calc) (ref 0.2–1.2)
Total Bilirubin: 0.7 mg/dL (ref 0.2–1.2)
Total Protein: 7.8 g/dL (ref 6.1–8.1)

## 2022-10-23 LAB — LIVER FIBROSIS, FIBROTEST-ACTITEST
ALT: 14 U/L (ref 9–46)
Alpha-2-Macroglobulin: 238 mg/dL (ref 106–279)
Apolipoprotein A1: 130 mg/dL (ref 94–176)
Bilirubin: 0.6 mg/dL (ref 0.2–1.2)
Fibrosis Score: 0.6
GGT: 15 U/L (ref 3–90)
Haptoglobin: <8 mg/dL — ABNORMAL LOW (ref 43–212)
Necroinflammat ACT Score: 0.07
Reference ID: 4845545

## 2022-10-23 LAB — HEPATITIS C RNA QUANTITATIVE
HCV Quantitative Log: <1.18 log IU/mL
HCV RNA, PCR, QN: <15 IU/mL

## 2022-10-23 LAB — CBC
HCT: 45.7 % (ref 38.5–50.0)
Hemoglobin: 15.6 g/dL (ref 13.2–17.1)
MCH: 30.1 pg (ref 27.0–33.0)
MCHC: 34.1 g/dL (ref 32.0–36.0)
MCV: 88.1 fL (ref 80.0–100.0)
MPV: 8.8 fL (ref 7.5–12.5)
Platelets: 306 10*3/uL (ref 140–400)
RBC: 5.19 10*6/uL (ref 4.20–5.80)
RDW: 14.3 % (ref 11.0–15.0)
WBC: 7.1 10*3/uL (ref 3.8–10.8)

## 2022-10-23 LAB — HEPATITIS B SURFACE ANTIGEN: Hepatitis B Surface Ag: NONREACTIVE

## 2022-10-23 LAB — PROTIME-INR
INR: 1.1
Prothrombin Time: 11.6 s — ABNORMAL HIGH (ref 9.0–11.5)

## 2022-10-23 LAB — T PALLIDUM AB: T Pallidum Abs: POSITIVE — AB

## 2022-10-23 LAB — RPR TITER: RPR Titer: 1:2 {titer} — ABNORMAL HIGH

## 2022-10-23 LAB — HEPATITIS B SURFACE ANTIBODY, QUANTITATIVE: Hep B S AB Quant (Post): >1000 m[IU]/mL (ref >=10)

## 2022-10-23 LAB — RPR: RPR Ser Ql: REACTIVE — AB

## 2022-11-09 ENCOUNTER — Ambulatory Visit: Payer: Medicaid Other | Admitting: Family

## 2022-12-06 ENCOUNTER — Ambulatory Visit: Payer: 59 | Admitting: Family

## 2022-12-18 ENCOUNTER — Emergency Department (HOSPITAL_COMMUNITY): Admission: EM | Admit: 2022-12-18 | Discharge: 2022-12-18 | Discharge status: Against Medical Advice | Payer: 59

## 2022-12-18 NOTE — ED Notes (Signed)
Pt not answering named called 3xs no answer

## 2023-01-24 ENCOUNTER — Ambulatory Visit: Payer: 59 | Admitting: Family

## 2023-01-25 ENCOUNTER — Telehealth: Payer: Self-pay

## 2023-01-25 NOTE — Telephone Encounter (Signed)
Detectable Viral Load Intervention   Most recent VL:  HIV 1 RNA Quant  Date Value Ref Range Status  10/10/2022 508 (H) copies/mL Final  08/05/2022 6,090 (H) Copies/mL Final  05/03/2021 44 (H) Copies/mL Final    Last Clinic Visit: 10/10/22  Current ART regimen: Symtuza  Appointment status: patient does not have future appointment scheduled   Medication last dispensed (per chart review):   Dispensed Days Supply Quantity Provider Pharmacy  [facility] TABLETS 12/16/2022 30 30 each Veryl Speak, FNP [facility] #...  SYMTUZA TABLETS 10/10/2022 30 30 each Veryl Speak, FNP [facility] DRUG STORE #...  SYMTUZA TABLETS 08/10/2022 30 30 each Jennette Kettle, RPH-CPP [facility] DRUG STORE #...     Interventions   Called patient to discuss medication adherence and possible barriers to care.   Number is not in service.   Sandie Ano, RN

## 2023-02-01 ENCOUNTER — Emergency Department (HOSPITAL_COMMUNITY)
Admission: EM | Admit: 2023-02-01 | Discharge: 2023-02-01 | Disposition: A | Discharge status: Home / Self Care | Payer: Medicaid Other | Attending: Emergency Medicine | Admitting: Emergency Medicine

## 2023-02-01 ENCOUNTER — Emergency Department (HOSPITAL_COMMUNITY): Payer: Medicaid Other

## 2023-02-01 DIAGNOSIS — L03116 Cellulitis of left lower limb: Secondary | ICD-10-CM | POA: Diagnosis not present

## 2023-02-01 DIAGNOSIS — R21 Rash and other nonspecific skin eruption: Secondary | ICD-10-CM | POA: Diagnosis not present

## 2023-02-01 DIAGNOSIS — R600 Localized edema: Secondary | ICD-10-CM | POA: Diagnosis not present

## 2023-02-01 LAB — CBC WITH DIFFERENTIAL/PLATELET
Abs Immature Granulocytes: 0.03 10*3/uL (ref 0.00–0.07)
Basophils Absolute: 0 10*3/uL (ref 0.0–0.1)
Basophils Relative: 0 %
Eosinophils Absolute: 0.1 10*3/uL (ref 0.0–0.5)
Eosinophils Relative: 1 %
HCT: 44.7 % (ref 39.0–52.0)
Hemoglobin: 15.3 g/dL (ref 13.0–17.0)
Immature Granulocytes: 0 %
Lymphocytes Relative: 21 %
Lymphs Abs: 2 10*3/uL (ref 0.7–4.0)
MCH: 31.4 pg (ref 26.0–34.0)
MCHC: 34.2 g/dL (ref 30.0–36.0)
MCV: 91.8 fL (ref 80.0–100.0)
Monocytes Absolute: 1.1 10*3/uL — ABNORMAL HIGH (ref 0.1–1.0)
Monocytes Relative: 12 %
Neutro Abs: 6.2 10*3/uL (ref 1.7–7.7)
Neutrophils Relative %: 66 %
Platelets: 254 10*3/uL (ref 150–400)
RBC: 4.87 MIL/uL (ref 4.22–5.81)
RDW: 13.9 % (ref 11.5–15.5)
WBC: 9.5 10*3/uL (ref 4.0–10.5)
nRBC: 0 % (ref 0.0–0.2)

## 2023-02-01 LAB — COMPREHENSIVE METABOLIC PANEL
ALT: 15 U/L (ref 0–44)
AST: 16 U/L (ref 15–41)
Albumin: 3.9 g/dL (ref 3.5–5.0)
Alkaline Phosphatase: 57 U/L (ref 38–126)
Anion gap: 10 (ref 5–15)
BUN: 12 mg/dL (ref 6–20)
CO2: 26 mmol/L (ref 22–32)
Calcium: 9.1 mg/dL (ref 8.9–10.3)
Chloride: 97 mmol/L — ABNORMAL LOW (ref 98–111)
Creatinine, Ser: 1.02 mg/dL (ref 0.61–1.24)
GFR, Estimated: >60 mL/min (ref >60)
Glucose, Bld: 67 mg/dL — ABNORMAL LOW (ref 70–99)
Potassium: 3.7 mmol/L (ref 3.5–5.1)
Sodium: 133 mmol/L — ABNORMAL LOW (ref 135–145)
Total Bilirubin: 0.7 mg/dL (ref 0.3–1.2)
Total Protein: 8.1 g/dL (ref 6.5–8.1)

## 2023-02-01 MED ORDER — CEPHALEXIN 250 MG PO CAPS
500.0000 mg | ORAL_CAPSULE | Freq: Once | ORAL | Status: AC
  Administered 2023-02-01: 500 mg via ORAL
  Filled 2023-02-01: qty 2

## 2023-02-01 MED ORDER — CEPHALEXIN 500 MG PO CAPS: 500.0000 mg | ORAL_CAPSULE | Freq: Four times a day (QID) | ORAL | 0 refills | Status: DC

## 2023-02-01 NOTE — ED Provider Triage Note (Signed)
Emergency Medicine Provider Triage Evaluation Note  [name] , a 37 y.o. male  was evaluated in triage.  Pt complains of left leg pain and rash over the anterior aspect of his left lower leg.  Rash started this morning.  He notes chills but no fever.  He sleeps outside in a tent, does not know if anything may have bitten him.  No trauma to the area.  Review of Systems  Positive: As above Negative: As above  Physical Exam  BP 117/68 (BP Location: Right Arm)   Pulse 86   Temp 97.8 F (36.6 C) (Oral)   Resp 17   SpO2 100%  Gen:   Awake, no distress   Resp:  Normal effort  MSK:   Moves extremities without difficulty  Other:  Erythema of the left lower extremity diffusely  Dorsalis pedis and posterior tibialis 2+ bilaterally  Medical Decision Making  Medically screening exam initiated at 5:53 PM.  Appropriate orders placed.  [name] was informed that the remainder of the evaluation will be completed by another provider, this initial triage assessment does not replace that evaluation, and the importance of remaining in the ED until their evaluation is complete.     Arabella Merles, PA-C 02/01/23 1805

## 2023-02-01 NOTE — Discharge Instructions (Addendum)
Evaluation today revealed that you likely have cellulitis in your leg.  This is an infection of the skin.  I am starting you on Keflex which is an antibiotic.  Please take the entire course and follow-up with your ID medical provider.  If the redness or rash seems to spread, you develop a fever, or your leg becomes more painful or if you develop chest pain or shortness of breath or any other concern please return emerged part for further evaluation.  Also at this time cannot definitively rule out a clot in your leg.  Submitted an ambulatory ultrasound to be done tomorrow morning.  You will be contacted to staff to set up that appointment for your ultrasound in the morning.

## 2023-02-01 NOTE — ED Triage Notes (Signed)
Pt states that since yesterday he has had painful red rash to LLE. Pt reports homelessness and unsure if he was exposed to poisonous plant. Area does not itch per pt and has no drainage.

## 2023-02-01 NOTE — ED Provider Notes (Addendum)
[place] EMERGENCY DEPARTMENT AT [facility] Provider Note   CSN: [id number] Arrival date & time: 02/01/23  1639     History  Chief Complaint  Patient presents with   Rash   HPI [name] is a 37 y.o. male with HIV and chronic hepatitis C presenting for rash. Noticed it yesterday.  Its located on the left lower extremity about the left shin and radiates around the leg and down close to the ankle. States it is hot to touch, red and swollen.  Denies chest pain shortness of breath. Not currently taking his Biktarvy.  Patient also reports that he is homeless and concerned he was exposed to "a poisonous plant".  States the area does not itch but tender to touch.   Rash      Home Medications Prior to Admission medications   Medication Sig Start Date End Date Taking? Authorizing Provider  cephALEXin (KEFLEX) 500 MG capsule Take 1 capsule (500 mg total) by mouth 4 (four) times daily. 02/01/23  Yes Gareth Eagle, PA-C  acetaminophen (TYLENOL) 500 MG tablet Take 500-1,000 mg by mouth every 6 (six) hours as needed for mild pain.    [provider]  Darunavir-Cobicistat-Emtricitabine-Tenofovir Alafenamide (SYMTUZA) 800-150-200-10 MG TABS Take 1 tablet by mouth daily with breakfast. 10/10/22   Veryl Speak, FNP  omeprazole (PRILOSEC) 20 MG capsule Take 1 capsule (20 mg total) by mouth daily. Patient not taking: Reported on 02/14/2019 06/24/18 02/14/19  Roxy Horseman, PA-C      Allergies    Patient has no known allergies.    Review of Systems   Review of Systems  Skin:  Positive for rash.    Physical Exam   Vitals:   02/01/23 1704 02/01/23 2129  BP: 117/68 118/75  Pulse: 86 87  Resp: 17 19  Temp: 97.8 F (36.6 C) 98.5 F (36.9 C)  SpO2: 100% 100%    CONSTITUTIONAL:  well-appearing, NAD NEURO:  Alert and oriented x 3, CN 3-12 grossly intact EYES:  eyes equal and reactive ENT/NECK:  Supple, no stridor  CARDIO:  regular rate and rhythm, appears  well-perfused  PULM:  No respiratory distress, CTAB GI/GU:  non-distended, soft MSK/SPINE:  No gross deformities, no edema, moves all extremities  SKIN:  rash on LLE, erythematous and edematous, and is hot to touch     *Additional and/or pertinent findings included in MDM below   ED Results / Procedures / Treatments   Labs (all labs ordered are listed, but only abnormal results are displayed) Labs Reviewed  CBC WITH DIFFERENTIAL/PLATELET - Abnormal; Notable for the following components:      Result Value   Monocytes Absolute 1.1 (*)    All other components within normal limits  COMPREHENSIVE METABOLIC PANEL - Abnormal; Notable for the following components:   Sodium 133 (*)    Chloride 97 (*)    Glucose, Bld 67 (*)    All other components within normal limits  T-HELPER CELLS (CD4) COUNT (NOT AT [facility])    EKG None  Radiology DG Tibia/Fibula Left  Result Date: 02/01/2023 CLINICAL DATA:  Left midshaft tibial edema EXAM: LEFT TIBIA AND FIBULA - 2 VIEW COMPARISON:  None Available. FINDINGS: There is no evidence of fracture or other focal bone lesions. No focal soft tissue abnormality or foreign body. IMPRESSION: Negative. Electronically Signed   By: Wiliam Ke M.D.   On: 02/01/2023 19:06    Procedures Procedures    Medications Ordered in ED Medications  cephALEXin (KEFLEX)  capsule 500 mg (has no administration in time range)    ED Course/ Medical Decision Making/ A&P                             Medical Decision Making Amount and/or Complexity of Data Reviewed Labs: ordered.  Risk Prescription drug management.   37 year old well-appearing male presenting for lower extremity rash.  Exam notable for rash in the left lower extremity. See pictures above.  DDx includes cellulitis, DVT, PE, sepsis. Symptoms and clinical findings most consistent with cellulitis. Cannot definitively rule out DVT at this time.  Patient remains clinically well-appearing, hemodynamically  stable and in no acute distress.  Treating what is likely a cellulitic rash with Keflex. Submitted ambulatory DVT study for tomorrow. Advised to follow-up with ID as patient is currently noncompliant with his HIV medications. Discussed pertinent return precautions. Vital stable throughout encounter.  Discharged home in good condition.   Final Clinical Impression(s) / ED Diagnoses Final diagnoses:  Cellulitis of left lower extremity    Rx / DC Orders ED Discharge Orders          Ordered    cephALEXin (KEFLEX) 500 MG capsule  4 times daily        02/01/23 2313    LE Venous       Comments: IMPORTANT PATIENT INSTRUCTIONS:  You have been scheduled for an Outpatient Vascular Study at [facility].    If tomorrow is a Saturday, Sunday or holiday, please go to the [facility] Emergency Department Registration Desk at 11 am tomorrow morning and tell them you are there for a vascular study.   If tomorrow is a weekday (Monday-Friday), please go to [facility] Entrance C, [facility] Clinic Registration at 11 am and tell them you are there for a vascular study.   02/01/23 2315              Gareth Eagle, PA-C 02/01/23 2316    Gareth Eagle, PA-C 02/01/23 2317    Loetta Rough, MD 02/03/23 1046

## 2023-02-02 LAB — T-HELPER CELLS (CD4) COUNT (NOT AT ARMC)
CD4 % Helper T Cell: 35 % (ref 33–65)
CD4 T Cell Abs: 550 /uL (ref 400–1790)

## 2023-02-06 ENCOUNTER — Emergency Department (HOSPITAL_COMMUNITY): Payer: 59

## 2023-02-06 ENCOUNTER — Emergency Department (HOSPITAL_COMMUNITY)
Admission: EM | Admit: 2023-02-06 | Discharge: 2023-02-07 | Discharge status: Against Medical Advice | Payer: 59 | Attending: Student | Admitting: Student

## 2023-02-06 ENCOUNTER — Encounter (HOSPITAL_COMMUNITY): Payer: Self-pay

## 2023-02-06 ENCOUNTER — Other Ambulatory Visit: Payer: Self-pay

## 2023-02-06 DIAGNOSIS — R2242 Localized swelling, mass and lump, left lower limb: Secondary | ICD-10-CM | POA: Diagnosis not present

## 2023-02-06 DIAGNOSIS — Z5321 Procedure and treatment not carried out due to patient leaving prior to being seen by health care provider: Secondary | ICD-10-CM | POA: Insufficient documentation

## 2023-02-06 DIAGNOSIS — R0689 Other abnormalities of breathing: Secondary | ICD-10-CM | POA: Diagnosis not present

## 2023-02-06 LAB — CBC WITH DIFFERENTIAL/PLATELET
Abs Immature Granulocytes: 0.03 10*3/uL (ref 0.00–0.07)
Basophils Absolute: 0 10*3/uL (ref 0.0–0.1)
Basophils Relative: 1 %
Eosinophils Absolute: 0.3 10*3/uL (ref 0.0–0.5)
Eosinophils Relative: 5 %
HCT: 42.7 % (ref 39.0–52.0)
Hemoglobin: 14.7 g/dL (ref 13.0–17.0)
Immature Granulocytes: 1 %
Lymphocytes Relative: 44 %
Lymphs Abs: 2.3 10*3/uL (ref 0.7–4.0)
MCH: 30.9 pg (ref 26.0–34.0)
MCHC: 34.4 g/dL (ref 30.0–36.0)
MCV: 89.9 fL (ref 80.0–100.0)
Monocytes Absolute: 0.5 10*3/uL (ref 0.1–1.0)
Monocytes Relative: 10 %
Neutro Abs: 2 10*3/uL (ref 1.7–7.7)
Neutrophils Relative %: 39 %
Platelets: 345 10*3/uL (ref 150–400)
RBC: 4.75 MIL/uL (ref 4.22–5.81)
RDW: 13.3 % (ref 11.5–15.5)
WBC: 5.2 10*3/uL (ref 4.0–10.5)
nRBC: 0 % (ref 0.0–0.2)

## 2023-02-06 LAB — COMPREHENSIVE METABOLIC PANEL
ALT: 15 U/L (ref 0–44)
AST: 17 U/L (ref 15–41)
Albumin: 3.7 g/dL (ref 3.5–5.0)
Alkaline Phosphatase: 59 U/L (ref 38–126)
Anion gap: 9 (ref 5–15)
BUN: 9 mg/dL (ref 6–20)
CO2: 29 mmol/L (ref 22–32)
Calcium: 9.4 mg/dL (ref 8.9–10.3)
Chloride: 101 mmol/L (ref 98–111)
Creatinine, Ser: 0.93 mg/dL (ref 0.61–1.24)
GFR, Estimated: >60 mL/min (ref >60)
Glucose, Bld: 89 mg/dL (ref 70–99)
Potassium: 3.3 mmol/L — ABNORMAL LOW (ref 3.5–5.1)
Sodium: 139 mmol/L (ref 135–145)
Total Bilirubin: 0.5 mg/dL (ref 0.3–1.2)
Total Protein: 7.6 g/dL (ref 6.5–8.1)

## 2023-02-06 LAB — I-STAT CG4 LACTIC ACID, ED: Lactic Acid, Venous: 1.3 mmol/L (ref 0.5–1.9)

## 2023-02-06 NOTE — ED Triage Notes (Signed)
Pt arrived via POV c/o LLE swelling. LLE noted to be erythemic, hot to touch, and edematous. Pt stated that it started about a week ago. 10/10 pain scale.

## 2023-02-07 LAB — URINALYSIS, ROUTINE W REFLEX MICROSCOPIC
Bacteria, UA: NONE SEEN
Bilirubin Urine: NEGATIVE
Glucose, UA: NEGATIVE mg/dL
Hgb urine dipstick: NEGATIVE
Ketones, ur: NEGATIVE mg/dL
Leukocytes,Ua: NEGATIVE
Nitrite: NEGATIVE
Protein, ur: 30 mg/dL — AB
Specific Gravity, Urine: 1.026 (ref 1.005–1.030)
pH: 6 (ref 5.0–8.0)

## 2023-02-07 NOTE — ED Notes (Signed)
Pt did not answer for room, moved otf.

## 2023-02-23 ENCOUNTER — Encounter (HOSPITAL_COMMUNITY): Payer: Self-pay

## 2023-02-23 ENCOUNTER — Emergency Department (HOSPITAL_COMMUNITY)
Admission: EM | Admit: 2023-02-23 | Discharge: 2023-02-24 | Discharge status: Against Medical Advice | Payer: 59 | Attending: Emergency Medicine | Admitting: Emergency Medicine

## 2023-02-23 DIAGNOSIS — L298 Other pruritus: Secondary | ICD-10-CM | POA: Insufficient documentation

## 2023-02-23 DIAGNOSIS — Z5321 Procedure and treatment not carried out due to patient leaving prior to being seen by health care provider: Secondary | ICD-10-CM | POA: Insufficient documentation

## 2023-02-23 NOTE — ED Triage Notes (Signed)
Pt states that the has had itching in his ankles bilaterally, was unwilling to get off his phone to help in triage, he was asked other questions as far as description of the itching and areas of concern in which he still was unhelpful and continued to only stay on his phone texting.

## 2023-02-24 NOTE — ED Notes (Signed)
Pt was being taken back to hall bed and then he turned around and stated that he does not want to be seen

## 2023-02-26 ENCOUNTER — Encounter (HOSPITAL_COMMUNITY): Payer: Self-pay

## 2023-02-26 ENCOUNTER — Emergency Department (HOSPITAL_COMMUNITY)
Admission: EM | Admit: 2023-02-26 | Discharge: 2023-02-26 | Disposition: A | Discharge status: Home / Self Care | Payer: 59 | Source: Home / Self Care | Attending: Emergency Medicine | Admitting: Emergency Medicine

## 2023-02-26 ENCOUNTER — Emergency Department (HOSPITAL_COMMUNITY)
Admission: EM | Admit: 2023-02-26 | Discharge: 2023-02-26 | Disposition: A | Discharge status: Home / Self Care | Payer: 59 | Attending: Student | Admitting: Student

## 2023-02-26 ENCOUNTER — Other Ambulatory Visit: Payer: Self-pay

## 2023-02-26 DIAGNOSIS — L03116 Cellulitis of left lower limb: Secondary | ICD-10-CM | POA: Insufficient documentation

## 2023-02-26 DIAGNOSIS — R21 Rash and other nonspecific skin eruption: Secondary | ICD-10-CM | POA: Diagnosis not present

## 2023-02-26 DIAGNOSIS — Z21 Asymptomatic human immunodeficiency virus [HIV] infection status: Secondary | ICD-10-CM | POA: Insufficient documentation

## 2023-02-26 DIAGNOSIS — F17210 Nicotine dependence, cigarettes, uncomplicated: Secondary | ICD-10-CM | POA: Insufficient documentation

## 2023-02-26 DIAGNOSIS — F1520 Other stimulant dependence, uncomplicated: Secondary | ICD-10-CM | POA: Insufficient documentation

## 2023-02-26 DIAGNOSIS — L03115 Cellulitis of right lower limb: Secondary | ICD-10-CM | POA: Diagnosis not present

## 2023-02-26 DIAGNOSIS — B20 Human immunodeficiency virus [HIV] disease: Secondary | ICD-10-CM | POA: Insufficient documentation

## 2023-02-26 DIAGNOSIS — L0390 Cellulitis, unspecified: Secondary | ICD-10-CM | POA: Insufficient documentation

## 2023-02-26 LAB — COMPREHENSIVE METABOLIC PANEL
ALT: 17 U/L (ref 0–44)
AST: 21 U/L (ref 15–41)
Albumin: 4.1 g/dL (ref 3.5–5.0)
Alkaline Phosphatase: 64 U/L (ref 38–126)
Anion gap: 12 (ref 5–15)
BUN: 15 mg/dL (ref 6–20)
CO2: 25 mmol/L (ref 22–32)
Calcium: 9.2 mg/dL (ref 8.9–10.3)
Chloride: 101 mmol/L (ref 98–111)
Creatinine, Ser: 1.08 mg/dL (ref 0.61–1.24)
GFR, Estimated: >60 mL/min (ref >60)
Glucose, Bld: 103 mg/dL — ABNORMAL HIGH (ref 70–99)
Potassium: 2.9 mmol/L — ABNORMAL LOW (ref 3.5–5.1)
Sodium: 138 mmol/L (ref 135–145)
Total Bilirubin: 0.4 mg/dL (ref 0.3–1.2)
Total Protein: 7.8 g/dL (ref 6.5–8.1)

## 2023-02-26 LAB — CBC WITH DIFFERENTIAL/PLATELET
Abs Immature Granulocytes: 0.01 10*3/uL (ref 0.00–0.07)
Basophils Absolute: 0 10*3/uL (ref 0.0–0.1)
Basophils Relative: 1 %
Eosinophils Absolute: 0.3 10*3/uL (ref 0.0–0.5)
Eosinophils Relative: 5 %
HCT: 42.4 % (ref 39.0–52.0)
Hemoglobin: 14.3 g/dL (ref 13.0–17.0)
Immature Granulocytes: 0 %
Lymphocytes Relative: 42 %
Lymphs Abs: 2 10*3/uL (ref 0.7–4.0)
MCH: 30.5 pg (ref 26.0–34.0)
MCHC: 33.7 g/dL (ref 30.0–36.0)
MCV: 90.4 fL (ref 80.0–100.0)
Monocytes Absolute: 0.5 10*3/uL (ref 0.1–1.0)
Monocytes Relative: 10 %
Neutro Abs: 2 10*3/uL (ref 1.7–7.7)
Neutrophils Relative %: 42 %
Platelets: 251 10*3/uL (ref 150–400)
RBC: 4.69 MIL/uL (ref 4.22–5.81)
RDW: 13.2 % (ref 11.5–15.5)
WBC: 4.7 10*3/uL (ref 4.0–10.5)
nRBC: 0 % (ref 0.0–0.2)

## 2023-02-26 MED ORDER — SULFAMETHOXAZOLE-TRIMETHOPRIM 800-160 MG PO TABS
1.0000 | ORAL_TABLET | Freq: Once | ORAL | Status: AC
  Administered 2023-02-26: 1 via ORAL
  Filled 2023-02-26: qty 1

## 2023-02-26 MED ORDER — DOXYCYCLINE HYCLATE 100 MG PO TABS: 100.0000 mg | ORAL_TABLET | Freq: Once | ORAL | Status: DC

## 2023-02-26 MED ORDER — SULFAMETHOXAZOLE-TRIMETHOPRIM 800-160 MG PO TABS
1.0000 | ORAL_TABLET | Freq: Two times a day (BID) | ORAL | 0 refills | Status: DC
  Filled 2023-02-26: qty 14, 7d supply, fill #0

## 2023-02-26 MED ORDER — SULFAMETHOXAZOLE-TRIMETHOPRIM 800-160 MG PO TABS: 1.0000 | ORAL_TABLET | Freq: Two times a day (BID) | ORAL | 0 refills | Status: DC

## 2023-02-26 MED ORDER — DIPHENHYDRAMINE HCL 25 MG PO CAPS
25.0000 mg | ORAL_CAPSULE | Freq: Once | ORAL | Status: AC
  Administered 2023-02-26: 25 mg via ORAL
  Filled 2023-02-26: qty 1

## 2023-02-26 MED ORDER — CEFADROXIL 500 MG PO CAPS: 500.0000 mg | ORAL_CAPSULE | Freq: Two times a day (BID) | ORAL | 0 refills | Status: DC

## 2023-02-26 MED ORDER — MAGNESIUM OXIDE -MG SUPPLEMENT 400 (240 MG) MG PO TABS
800.0000 mg | ORAL_TABLET | Freq: Once | ORAL | Status: AC
  Administered 2023-02-26: 800 mg via ORAL
  Filled 2023-02-26: qty 2

## 2023-02-26 MED ORDER — CEFADROXIL 500 MG PO CAPS
500.0000 mg | ORAL_CAPSULE | Freq: Two times a day (BID) | ORAL | Status: DC
  Administered 2023-02-26: 500 mg via ORAL
  Filled 2023-02-26: qty 1

## 2023-02-26 MED ORDER — POTASSIUM CHLORIDE CRYS ER 20 MEQ PO TBCR
40.0000 meq | EXTENDED_RELEASE_TABLET | Freq: Once | ORAL | Status: AC
  Administered 2023-02-26: 40 meq via ORAL
  Filled 2023-02-26: qty 2

## 2023-02-26 MED ORDER — HYDROCORTISONE 1 % EX CREA
TOPICAL_CREAM | Freq: Once | CUTANEOUS | Status: AC
  Filled 2023-02-26: qty 28

## 2023-02-26 MED ORDER — DIPHENHYDRAMINE HCL 25 MG PO TABS
25.0000 mg | ORAL_TABLET | Freq: Four times a day (QID) | ORAL | 0 refills | Status: AC | PRN
  Filled 2023-02-26: qty 20, 5d supply, fill #0

## 2023-02-26 MED ORDER — CEFADROXIL 500 MG PO CAPS
500.0000 mg | ORAL_CAPSULE | Freq: Once | ORAL | Status: AC
  Administered 2023-02-26: 500 mg via ORAL
  Filled 2023-02-26: qty 1

## 2023-02-26 MED ORDER — CEFADROXIL 500 MG PO CAPS
500.0000 mg | ORAL_CAPSULE | Freq: Two times a day (BID) | ORAL | 0 refills | Status: DC
  Filled 2023-02-26: qty 14, 7d supply, fill #0

## 2023-02-26 NOTE — ED Provider Notes (Signed)
[place] EMERGENCY DEPARTMENT AT [facility] Provider Note   CSN: [id number] Arrival date & time: 02/26/23  2117     History  Chief Complaint  Patient presents with   Rash    Aveyon Nurse is a 37 y.o. male history of HIV noncompliant with his meds, chronic hepatitis C, here presenting with rash.  Patient started having rash to bilateral legs for several weeks now.  Patient was initially prescribed Keflex but did not really take it.  Patient also continues to use IV drugs.  Patient was seen yesterday for lower extremity rash.  He was prescribed Duricef and Bactrim but did not fill his prescription.  Patient states that the rash is itchy.  Patient denies any fevers.  The history is provided by the patient.       Home Medications Prior to Admission medications   Medication Sig Start Date End Date Taking? Authorizing Provider  acetaminophen (TYLENOL) 500 MG tablet Take 500-1,000 mg by mouth every 6 (six) hours as needed for mild pain.    [provider]  cefadroxil (DURICEF) 500 MG capsule Take 1 capsule (500 mg total) by mouth 2 (two) times daily for 7 days. 02/26/23 03/05/23  Kommor, [place], MD  Darunavir-Cobicistat-Emtricitabine-Tenofovir Alafenamide [facility]) 800-150-200-10 MG TABS Take 1 tablet by mouth daily with breakfast. 10/10/22   Veryl Speak, FNP  sulfamethoxazole-trimethoprim (BACTRIM DS) 800-160 MG tablet Take 1 tablet by mouth 2 (two) times daily for 7 days. 02/26/23 03/05/23  Kommor, Madison, MD  omeprazole (PRILOSEC) 20 MG capsule Take 1 capsule (20 mg total) by mouth daily. Patient not taking: Reported on 02/14/2019 06/24/18 02/14/19  Roxy Horseman, PA-C      Allergies    Patient has no known allergies.    Review of Systems   Review of Systems  Skin:  Positive for rash.  All other systems reviewed and are negative.   Physical Exam Updated Vital Signs BP (!) 137/92 (BP Location: Left Arm)   Pulse 81   Temp 98.1 F (36.7 C) (Oral)   Resp  18   SpO2 100%  Physical Exam Vitals and nursing note reviewed.  Constitutional:      Appearance: Normal appearance.  HENT:     Head: Normocephalic.     Nose: Nose normal.     Mouth/Throat:     Mouth: Mucous membranes are moist.  Eyes:     Extraocular Movements: Extraocular movements intact.     Pupils: Pupils are equal, round, and reactive to light.  Cardiovascular:     Rate and Rhythm: Normal rate and regular rhythm.     Pulses: Normal pulses.     Heart sounds: Normal heart sounds.  Pulmonary:     Effort: Pulmonary effort is normal.     Breath sounds: Normal breath sounds.  Abdominal:     General: Abdomen is flat.     Palpations: Abdomen is soft.  Musculoskeletal:        General: Normal range of motion.     Cervical back: Normal range of motion and neck supple.  Skin:    Comments: Redness to bilateral lower extremity.  Patient has several areas of pruritic rash.  No subcutaneous air.  Neurological:     General: No focal deficit present.     Mental Status: He is alert and oriented to person, place, and time.  Psychiatric:        Mood and Affect: Mood normal.        Behavior: Behavior normal.  ED Results / Procedures / Treatments   Labs (all labs ordered are listed, but only abnormal results are displayed) Labs Reviewed - No data to display  EKG None  Radiology No results found.  Procedures Procedures    Medications Ordered in ED Medications  cefadroxil (DURICEF) capsule 500 mg (has no administration in time range)  sulfamethoxazole-trimethoprim (BACTRIM DS) 800-160 MG per tablet 1 tablet (has no administration in time range)  diphenhydrAMINE (BENADRYL) capsule 25 mg (has no administration in time range)    ED Course/ Medical Decision Making/ A&P                                 Medical Decision Making Bedford Milles is a 37 y.o. male here presenting with lower extremity redness.  I am unclear if he had allergic reaction versus cellulitis.  Patient just  had labs drawn yesterday.  His potassium was slightly low and was supplemented.  Patient did not fill his antibiotics.  Patient was prescribed Duricef and Bactrim.  I messaged the Child psychotherapist and sent the patient's medication to the transition of care pharmacy.   Problems Addressed: Bilateral lower leg cellulitis: acute illness or injury  Risk Prescription drug management.    Final Clinical Impression(s) / ED Diagnoses Final diagnoses:  None    Rx / DC Orders ED Discharge Orders     None         Charlynne Pander, MD 02/26/23 2305

## 2023-02-26 NOTE — ED Notes (Signed)
Pt verbalized understanding of discharge instructions. Pt ambulated from ed with steady gait.  

## 2023-02-26 NOTE — Discharge Instructions (Signed)
I have sent your prescription to the transition of care pharmacy.  I have also messaged the social worker to see if you will qualify for medication assistance.  Please call there tomorrow to pick up your prescriptions  You received a dose of antibiotics and Benadryl in the ED today.   Follow-up with your doctor  Return to ER if you have worse cellulitis or fever

## 2023-02-26 NOTE — ED Triage Notes (Signed)
Pt arrived via POV c/o rash that itches and is hot to touch on BLE, below the knees. Area noted to be bright red in color.

## 2023-02-26 NOTE — ED Provider Notes (Signed)
[place] EMERGENCY DEPARTMENT AT [facility] Provider Note  CSN: [id number] Arrival date & time: 02/26/23 1696  Chief Complaint(s) Rash  HPI [name] is a 37 y.o. male with PMH ADHD, chronic hepatitis C, HIV, polysubstance abuse who presents emergency room for evaluation of lower extremity rash.  Patient seen on 02/01/2023 with a similar complaint and ultimately diagnosed with cellulitis.  He states that symptoms began this morning with erythema of bilateral lower extremities right significantly greater than left.  He states that his legs are pruritic and he has a mild amount of pain.  Denies fever, chest pain, shortness of breath, Donnell pain, nausea, vomiting or other systemic symptoms.  Patient does endorse meth use last night and does have a history of IV drug use.   Past Medical History Past Medical History:  Diagnosis Date   ADHD (attention deficit hyperactivity disorder)    Chronic hepatitis C without hepatic coma (HCC) 05/12/2020   Drug abuse (HCC)    meth, marijuana   HIV positive (HCC)    Patient Active Problem List   Diagnosis Date Noted   Healthcare maintenance 04/20/2022   Homeless 05/03/2021   Syphilis 05/03/2021   Closed fracture of body of mandible (HCC)    Syncope and collapse    Fall 10/15/2020   Chronic hepatitis C without hepatic coma (HCC) 05/12/2020   Methamphetamine intoxication (HCC) 12/20/2019   AKI (acute kidney injury) (HCC) 12/20/2019   Elevated transaminase level 12/20/2019   Elevated CK    Methamphetamine use disorder, severe, dependence (HCC) 07/10/2019   MDD (major depressive disorder), recurrent severe, 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 Authorizing Provider  acetaminophen (TYLENOL) 500 MG tablet Take 500-1,000 mg by mouth every 6 (six) hours as needed for mild pain.     [provider]  cephALEXin (KEFLEX) 500 MG capsule Take 1 capsule (500 mg total) by mouth 4 (four) times daily. 02/01/23   Gareth Eagle, PA-C  Darunavir-Cobicistat-Emtricitabine-Tenofovir Alafenamide [facility]) 800-150-200-10 MG TABS Take 1 tablet by mouth daily with breakfast. 10/10/22   Veryl Speak, FNP  omeprazole (PRILOSEC) 20 MG capsule Take 1 capsule (20 mg total) by mouth daily. Patient not taking: Reported on 02/14/2019 06/24/18 02/14/19  Roxy Horseman, PA-C                                                                                                                                    Past Surgical History Past Surgical History:  Procedure Laterality Date   ORIF MANDIBULAR FRACTURE Right 10/16/2020   Procedure: OPEN REDUCTION INTERNAL FIXATION (ORIF) SUBCONDYLAR FRACTURE WITH REPAIR OF 3CM CHIN LACERATION;  Surgeon: Lovena Neighbours, MD;  Location: [facility] OR;  Service: Oral Surgery;  Laterality: Right;   Family History Family History  Problem Relation Age of Onset  Diabetes Mother    Hypertension Mother    Cancer Mother    Cancer Father    Diabetes Father    Hypertension Father     Social History Social History   Tobacco Use   Smoking status: Every Day    Current packs/day: 1.00    Average packs/day: 1 pack/day for 10.0 years (10.0 ttl pk-yrs)    Types: Cigarettes   Smokeless tobacco: Never  Vaping Use   Vaping status: Never Used  Substance Use Topics   Alcohol use: Not Currently   Drug use: Yes    Types: Methamphetamines   Allergies Patient has no known allergies.  Review of Systems Review of Systems  Skin:  Positive for rash.    Physical Exam Vital Signs  I have reviewed the triage vital signs BP (!) 136/94 (BP Location: Right Arm)   Pulse (!) 105   Temp 98.1 F (36.7 C) (Oral)   Resp 18   Ht 6\' 1"  (1.854 m)   Wt 81.6 kg   SpO2 97%   BMI 23.73 kg/m   Physical Exam Constitutional:      General: He is not in acute distress.     Appearance: Normal appearance.  HENT:     Head: Normocephalic and atraumatic.     Nose: No congestion or rhinorrhea.  Eyes:     General:        Right eye: No discharge.        Left eye: No discharge.     Extraocular Movements: Extraocular movements intact.     Pupils: Pupils are equal, round, and reactive to light.  Cardiovascular:     Rate and Rhythm: Normal rate and regular rhythm.     Heart sounds: No murmur heard. Pulmonary:     Effort: No respiratory distress.     Breath sounds: No wheezing or rales.  Abdominal:     General: There is no distension.     Tenderness: There is no abdominal tenderness.  Musculoskeletal:        General: Normal range of motion.     Cervical back: Normal range of motion.  Skin:    General: Skin is warm and dry.     Findings: Erythema and rash present.  Neurological:     General: No focal deficit present.     Mental Status: He is alert.     ED Results and Treatments Labs (all labs ordered are listed, but only abnormal results are displayed) Labs Reviewed  COMPREHENSIVE METABOLIC PANEL - Abnormal; Notable for the following components:      Result Value   Potassium 2.9 (*)    Glucose, Bld 103 (*)    All other components within normal limits  CBC WITH DIFFERENTIAL/PLATELET                                                                                                                          Radiology No results found.  Pertinent labs & imaging  results that were available during my care of the patient were reviewed by me and considered in my medical decision making (see MDM for details).  Medications Ordered in ED Medications  potassium chloride SA (KLOR-CON M) CR tablet 40 mEq (has no administration in time range)  magnesium oxide (MAG-OX) tablet 800 mg (has no administration in time range)                                                                                                                                      Procedures Procedures  (including critical care time)  Medical Decision Making / ED Course   This patient presents to the ED for concern of rash, this involves an extensive number of treatment options, and is a complaint that carries with it a high risk of complications and morbidity.  The differential diagnosis includes cellulitis, stasis dermatitis, urticaria, exposure, DVT  MDM: Patient seen emergency room for evaluation of lower extremity rash.  Physical exam with bilateral lower extremity erythema right significant greater than left.  Erythema extends over the shin and stops at the knee on the anterior surface only on the right raising higher suspicion for cellulitis.  I have lower suspicion for DVT as the erythema is not circumferential or associated with any swelling.  Laboratory evaluation without significant leukocytosis, potassium 2.9 secondary to decreased p.o. intake.  Potassium repleted in the ER.  Given his history of IV drug use we will expand antibiotic coverage and patient will be given Duricef and Bactrim to cover for MRSA and strep.  Given hydrocortisone cream for the itching.  At this time he does not meet inpatient criteria for admission he is safe for discharge with outpatient follow-up.  No murmur heard on exam and I have lower suspicion for endocarditis today given no fever or leukocytosis.   Additional history obtained:  -External records from outside source obtained and reviewed including: Chart review including previous notes, labs, imaging, consultation notes   Lab Tests: -I ordered, reviewed, and interpreted labs.   The pertinent results include:   Labs Reviewed  COMPREHENSIVE METABOLIC PANEL - Abnormal; Notable for the following components:      Result Value   Potassium 2.9 (*)    Glucose, Bld 103 (*)    All other components within normal limits  CBC WITH DIFFERENTIAL/PLATELET    Medicines ordered and prescription drug management: Meds ordered this  encounter  Medications   potassium chloride SA (KLOR-CON M) CR tablet 40 mEq   magnesium oxide (MAG-OX) tablet 800 mg    -I have reviewed the patients home medicines and have made adjustments as needed  Critical interventions none     Social Determinants of Health:  Factors impacting patients care include: homesless, IVDU   Reevaluation: After the interventions noted above, I reevaluated the patient and found that they have :improved  Co morbidities that complicate the patient evaluation  Past Medical History:  Diagnosis Date   ADHD (attention deficit hyperactivity disorder)    Chronic hepatitis C without hepatic coma (HCC) 05/12/2020   Drug abuse (HCC)    meth, marijuana   HIV positive (HCC)       Dispostion: I considered admission for this patient, but at this time he does not meet inpatient criteria for admission he is safe for discharge with outpatient follow-up     Final Clinical Impression(s) / ED Diagnoses Final diagnoses:  None     @PCDICTATION @    Glendora Score, MD 02/26/23 [phone]

## 2023-02-26 NOTE — ED Triage Notes (Signed)
Pt presents via POV c/o lower extremity rash. Reports seen yesterday at [facility] for same. Reports has spread to left leg. Denies taking benadryl at home.

## 2023-02-27 ENCOUNTER — Other Ambulatory Visit (HOSPITAL_COMMUNITY): Payer: Self-pay

## 2023-02-28 ENCOUNTER — Ambulatory Visit (INDEPENDENT_AMBULATORY_CARE_PROVIDER_SITE_OTHER): Payer: 59 | Admitting: Pharmacist

## 2023-02-28 ENCOUNTER — Other Ambulatory Visit: Payer: Self-pay

## 2023-02-28 ENCOUNTER — Other Ambulatory Visit (HOSPITAL_COMMUNITY): Payer: Self-pay

## 2023-02-28 DIAGNOSIS — Z21 Asymptomatic human immunodeficiency virus [HIV] infection status: Secondary | ICD-10-CM

## 2023-02-28 DIAGNOSIS — B182 Chronic viral hepatitis C: Secondary | ICD-10-CM

## 2023-02-28 DIAGNOSIS — B20 Human immunodeficiency virus [HIV] disease: Secondary | ICD-10-CM | POA: Diagnosis not present

## 2023-02-28 DIAGNOSIS — Z113 Encounter for screening for infections with a predominantly sexual mode of transmission: Secondary | ICD-10-CM | POA: Diagnosis not present

## 2023-02-28 MED ORDER — SULFAMETHOXAZOLE-TRIMETHOPRIM 800-160 MG PO TABS: 1.0000 | ORAL_TABLET | Freq: Two times a day (BID) | ORAL | 0 refills | Status: AC

## 2023-02-28 MED ORDER — SYMTUZA 800-150-200-10 MG PO TABS: 1.0000 | ORAL_TABLET | Freq: Every day | ORAL | 3 refills | Status: DC

## 2023-02-28 MED ORDER — CEFADROXIL 500 MG PO CAPS: 500.0000 mg | ORAL_CAPSULE | Freq: Two times a day (BID) | ORAL | 0 refills | Status: AC

## 2023-02-28 NOTE — Progress Notes (Signed)
02/28/2023  HPI: [name] is a 37 y.o. male who presents to the [facility] pharmacy clinic for HIV follow-up.  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   CEFADROXIL (DURICEF) 500 MG CAPSULE    Take 1 capsule (500 mg total) by mouth 2 (two) times daily for 7 days.   DARUNAVIR-COBICISTAT-EMTRICITABINE-TENOFOVIR ALAFENAMIDE (SYMTUZA) 800-150-200-10 MG TABS    Take 1 tablet by mouth daily with breakfast.   DIPHENHYDRAMINE (BENADRYL) 25 MG TABLET    Take 1 tablet (25 mg total) by mouth every 6 (six) hours as needed.   SULFAMETHOXAZOLE-TRIMETHOPRIM (BACTRIM DS) 800-160 MG TABLET    Take 1 tablet by mouth 2 (two) times daily for 7 days.  Modified Medications   No medications on file  Discontinued Medications   No medications on file    Labs: Lab Results  Component Value Date   HIV1RNAQUANT 508 (H) 10/10/2022   HIV1RNAQUANT 6,090 (H) 08/05/2022   HIV1RNAQUANT 44 (H) 05/03/2021   CD4TABS 550 02/01/2023   CD4TABS 755 10/10/2022   CD4TABS 600 03/16/2021    RPR and STI Lab Results  Component Value Date   LABRPR REACTIVE (A) 10/10/2022   LABRPR REACTIVE (A) 08/05/2022   LABRPR  REACTIVE (A) 03/16/2021   LABRPR Reactive (A) 10/15/2020   LABRPR Reactive (A) 07/30/2020   RPRTITER 1:2 (H) 10/10/2022   RPRTITER 1:2 (H) 08/05/2022   RPRTITER 1:8 (H) 03/16/2021   RPRTITER 1:2 (H) 05/12/2020   RPRTITER 1:2 (H) 01/13/2020    STI Results GC GC CT CT  Latest Ref Rng & Units  NEGATIVE  NEGATIVE  10/15/2020  8:43 PM Negative   Negative    05/12/2020 10:42 AM Negative   Negative    01/13/2020  9:13 AM Negative    Negative    Negative   Negative    Positive    Negative    10/09/2019 10:24 AM Negative   Negative    02/24/2019 12:00 AM Negative   Negative    06/06/2018 12:00 AM Negative   Negative    10/14/2017 12:00 AM Negative   Negative    01/26/2014  1:45 PM  NEGATIVE   NEGATIVE   07/23/2010  6:11 PM   NEGATIVE (NOTE)  Testing performed using the BD ProbeTec Qx Chlamydia trachomatis and Neisseria gonorrhea amplified DNA assay.  Performed at:  [facility]               [address]                [place], [place] [address]  [id number]    12/09/2009  1:10 AM   NEGATIVE (NOTE)  Testing performed using the BD Probetec ET Chlamydia trachomatis and Neisseria gonorrhea amplified DNA assay.    03/08/2007  3:27 AM   NEGATIVE (NOTE)  Testing performed using the BD Probetec ET Chlamydia trachomatis and Neisseria gonorrhea amplified DNA assay.      Hepatitis B Lab Results  Component Value Date   HEPBSAB REACTIVE (A) 10/09/2019   HEPBSAG NON-REACTIVE 10/10/2022   HEPBCAB NON-REACTIVE 10/09/2019   Hepatitis C Lab Results  Component Value Date   HEPCAB REACTIVE (A) 10/09/2019   HCVRNAPCRQN <15 10/10/2022   Hepatitis A Lab Results  Component Value Date   HAV NON-REACTIVE 10/09/2019   Lipids: Lab Results  Component Value Date   CHOL 137 08/05/2022   TRIG 185 (H) 08/05/2022   HDL 40 08/05/2022   CHOLHDL 3.4 08/05/2022   VLDL 31 07/10/2019   LDLCALC 71 08/05/2022    Current HIV  Regimen: Symtuza  Assessment: [name] presents today for HIV treatment follow-up. He has missed his three prior appointments for follow-up and has been to the ED 7 times since his last appointment. He reports that he has been off of his Symtuza for about 1 and a half months. He also reports poor adherence when he was taking the medication and was missing about 2 doses per week. He endorses good tolerance to Symtuza when previously taking it. Last viral load was 508 on 10/10/22 and last CD4 count was 550 on 02/01/23. Symtuza was reinitiated today. He was provided with 1 full bottle of samples today. The importance of adherence and following up with appointments was stressed.   He was seen in the ED on 8/11 for bilateral lower extremity cellulitis and was prescribed cefadroxil and Bactrim. He reports that he was not able to fill these prescriptions, so he was not able to initiate treatment.   [name] reports multiple social challenges. He currently does not have stable housing, reliable transportation and no working phone. We discussed utilizing [facility] as the most accessible pharmacy and will connect him with [facility] for further resource management.   Plan: - Obtain HIV RNA viral load with genotype reflex, RPR, BMP and hepatitis C RNA viral load today  - Reinitiate Symtuza today - Follow-up appointment has been scheduled for 9/10 at 1:45 pm with Cassie - Contact with any questions or concerns   [name], PharmD PGY1 Pharmacy Resident 02/28/2023 2:46 PM

## 2023-03-11 ENCOUNTER — Emergency Department (HOSPITAL_COMMUNITY)
Admission: EM | Admit: 2023-03-11 | Discharge: 2023-03-11 | Disposition: A | Discharge status: Home / Self Care | Payer: 59 | Attending: Emergency Medicine | Admitting: Emergency Medicine

## 2023-03-11 ENCOUNTER — Other Ambulatory Visit: Payer: Self-pay

## 2023-03-11 ENCOUNTER — Emergency Department (HOSPITAL_COMMUNITY): Payer: 59

## 2023-03-11 DIAGNOSIS — N433 Hydrocele, unspecified: Secondary | ICD-10-CM | POA: Diagnosis not present

## 2023-03-11 DIAGNOSIS — Z21 Asymptomatic human immunodeficiency virus [HIV] infection status: Secondary | ICD-10-CM | POA: Insufficient documentation

## 2023-03-11 DIAGNOSIS — N5089 Other specified disorders of the male genital organs: Secondary | ICD-10-CM | POA: Insufficient documentation

## 2023-03-11 NOTE — ED Provider Notes (Signed)
[place] EMERGENCY DEPARTMENT AT [facility] Provider Note   CSN: [id number] Arrival date & time: 03/11/23  0530     History  Chief Complaint  Patient presents with   Left Testicle Lump    [name] is a 37 y.o. male.  37 year old male with a history of drug abuse, HIV, chronic hepatitis C presents to the emergency department for evaluation of left-sided scrotal mass.  He states that he noticed a bump on his left testicle 1 year ago.  He subjectively feels it has been increasing in size over the past 6 months.  There is no associated pain.  Denies scrotal swelling or color change, penile discharge, fevers, difficulty voiding.  Despite his multiple ED visits in the past 6 months, he has not felt it prudent to raise this concern during prior encounters.  The history is provided by the patient. No language interpreter was used.       Home Medications Prior to Admission medications   Medication Sig Start Date End Date Taking? Authorizing Provider  acetaminophen (TYLENOL) 500 MG tablet Take 500-1,000 mg by mouth every 6 (six) hours as needed for mild pain.    [provider]  Darunavir-Cobicistat-Emtricitabine-Tenofovir Alafenamide (SYMTUZA) 800-150-200-10 MG TABS Take 1 tablet by mouth daily with breakfast. 02/28/23   Kuppelweiser, Cassie L, RPH-CPP  diphenhydrAMINE (BENADRYL) 25 MG tablet Take 1 tablet (25 mg total) by mouth every 6 (six) hours as needed. 02/26/23   Charlynne Pander, MD  omeprazole (PRILOSEC) 20 MG capsule Take 1 capsule (20 mg total) by mouth daily. Patient not taking: Reported on 02/14/2019 06/24/18 02/14/19  Roxy Horseman, PA-C      Allergies    Patient has no known allergies.    Review of Systems   Review of Systems Ten systems reviewed and are negative for acute change, except as noted in the HPI.    Physical Exam Updated Vital Signs BP (!) 138/92 (BP Location: Right Arm)   Pulse 73   Temp 97.7 F (36.5 C) (Oral)   Resp 16    SpO2 99%   Physical Exam Vitals and nursing note reviewed. Exam conducted with a chaperone present.  Constitutional:      General: He is not in acute distress.    Appearance: He is well-developed. He is not diaphoretic.     Comments: Patient sitting on the bed eating a bagel with cream cheese  HENT:     Head: Normocephalic and atraumatic.  Eyes:     General: No scleral icterus.    Conjunctiva/sclera: Conjunctivae normal.  Pulmonary:     Effort: Pulmonary effort is normal. No respiratory distress.  Genitourinary:    Comments: Exam chaperoned by RN.  There is a nontender masslike structure to the posterior aspect of the left testicle.  No associated erythema, heat to touch, scrotal edema.  No crepitus.  Penis normal. Musculoskeletal:        General: Normal range of motion.     Cervical back: Normal range of motion.  Skin:    General: Skin is warm and dry.     Coloration: Skin is not pale.     Findings: No erythema or rash.  Neurological:     Mental Status: He is alert and oriented to person, place, and time.     Coordination: Coordination normal.  Psychiatric:        Behavior: Behavior normal.     ED Results / Procedures / Treatments   Labs (all labs ordered are  listed, but only abnormal results are displayed) Labs Reviewed - No data to display  EKG None  Radiology No results found.  Procedures Procedures    Medications Ordered in ED Medications - No data to display  ED Course/ Medical Decision Making/ A&P Clinical Course as of 03/11/23 0631  Sat Mar 11, 2023  0630 Lump on left testacle for a year. Dispo per imaging [JR]    Clinical Course User Index [JR] Gareth Eagle, PA-C                                 Medical Decision Making Amount and/or Complexity of Data Reviewed Radiology: ordered.   This patient presents to the ED for concern of testicular mass, this involves an extensive number of treatment options, and is a complaint that carries with it a  high risk of complications and morbidity.  The differential diagnosis includes cancer vs hydrocele vs epididymitis/orchitis vs varicocele    Co morbidities that complicate the patient evaluation  HIV Polysubstance abuse   Imaging Studies ordered:  I ordered imaging studies including US Scrotum  Imaging pending at shift change   Cardiac Monitoring:  The patient was maintained on a cardiac monitor.  I personally viewed and interpreted the cardiac monitored which showed an underlying rhythm of: NSR   Medicines ordered and prescription drug management:  I have reviewed the patients home medicines and have made adjustments as needed   Test Considered:  UA   Problem List / ED Course:  Chronic complaint with reassuring exam. US scrotum pending. Dispo per imaging.   Reevaluation:  After the interventions noted above, I reevaluated the patient and found that they have :stayed the same   Social Determinants of Health:  Homeless/housing instability   Dispostion:  Care signed out to [place], [place] at shift change.         Final Clinical Impression(s) / ED Diagnoses Final diagnoses:  None    Rx / DC Orders ED Discharge Orders     None         Antony Madura, PA-C 03/11/23 0631    Gilda Crease, MD 03/11/23 2330

## 2023-03-11 NOTE — ED Provider Notes (Signed)
Accepted handoff at shift change from [facility]. Please see prior provider note for more detail.   Briefly: Patient is 37 y.o. presenting for testicular mass that he noticed 1 year ago.  History of drug abuse, HIV chronic hepatitis C.  DDX: concern for  cancer vs hydrocele vs epididymitis/orchitis vs varicocele   Plan: Scrotal ultrasound pending.  Dispo per imaging.   Physical Exam  BP (!) 138/92 (BP Location: Right Arm)   Pulse 73   Temp 97.7 F (36.5 C) (Oral)   Resp 16   SpO2 99%   Physical Exam  Procedures  Procedures  ED Course / MDM   Clinical Course as of 03/11/23 0656  Sat Mar 11, 2023  0630 Lump on left testicle for a year. Increasing in size, no pain. Dispo per imaging. [JR]    Clinical Course User Index [JR] Gareth Eagle, PA-C   Medical Decision Making Amount and/or Complexity of Data Reviewed Radiology: ordered.    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 Recommendation was referral to urology given if other risk factors for testicular carcinoma. Patient does have HIV.  Thought it was appropriate for him to follow-up with urology.  Vitals stable. Discussed findings of ultrasound with patient.  Discharge.    Gareth Eagle, PA-C 03/11/23 1610    Gilda Crease, MD 03/11/23 2330

## 2023-03-11 NOTE — Discharge Instructions (Signed)
Evaluation today for your testicular mass was overall reassuring.  Ultrasound was negative for mass but did show microlithiasis recommend you follow-up with urology.  Their contact information is in your discharge summary.

## 2023-03-11 NOTE — ED Triage Notes (Signed)
Patient reports lump at left testicle onset last year .

## 2023-03-11 NOTE — ED Notes (Signed)
Patient transported to Ultrasound 

## 2023-03-16 ENCOUNTER — Telehealth: Payer: Self-pay

## 2023-03-16 NOTE — Telephone Encounter (Signed)
Patient walked into clinic today requesting to review recent lab results. States that he would like to know if labs would be enough for him to apply for government assistance. Is also asking how HIV Genotype turned out.  Per Carver Fila, FNP most recent Genotype shows the same resistance that his genotype did in March. The Symtuza should be fine to continue with the most important part to continue taking medications. Viral load is uncontrolled, but CD4 is in normal range. During discussion patient verbalized that he was curious to know if he stopped taking his medicine and allowed his CD4 to drop if he would get approved for disability. Informed patient that in [place] HIV is not considered a disability and that he should take his medication as prescribed.  Verbalized understanding. Understands provider will discuss labs in more depth at upcoming appointment. Encouraged patient to see [facility] for resources and assistance.  Juanita Laster, RMA

## 2023-03-28 ENCOUNTER — Ambulatory Visit: Payer: 59 | Admitting: Pharmacist

## 2023-03-28 ENCOUNTER — Ambulatory Visit: Payer: 59 | Admitting: Family

## 2023-04-03 ENCOUNTER — Other Ambulatory Visit: Payer: Self-pay | Admitting: Pharmacist

## 2023-04-04 ENCOUNTER — Telehealth: Payer: Self-pay

## 2023-04-04 NOTE — Telephone Encounter (Signed)
Detectable Viral Load Intervention (DVL)  Most recent VL:  HIV 1 RNA Quant  Date Value Ref Range Status  02/28/2023 100,000 (H) copies/mL Final  10/10/2022 508 (H) copies/mL Final  08/05/2022 6,090 (H) Copies/mL Final    Last Clinic Visit: 02/28/23  Current ART regimen: Symtuza  Appointment status: patient does not have future appointment scheduled   Medication last dispensed (per chart review):   Medication Adherence  Not able to assess  Barriers to Care   Not able to assess    Interventions   Called patient to discuss medication adherence and possible barriers to care, not able to reach him.  Juanita Laster, RMA

## 2023-04-30 DIAGNOSIS — N492 Inflammatory disorders of scrotum: Secondary | ICD-10-CM | POA: Diagnosis not present

## 2023-05-02 ENCOUNTER — Other Ambulatory Visit: Payer: Self-pay

## 2023-05-02 ENCOUNTER — Emergency Department (HOSPITAL_COMMUNITY)
Admission: EM | Admit: 2023-05-02 | Discharge: 2023-05-02 | Discharge status: Against Medical Advice | Payer: 59 | Attending: Emergency Medicine | Admitting: Emergency Medicine

## 2023-05-02 ENCOUNTER — Emergency Department (HOSPITAL_COMMUNITY): Payer: 59

## 2023-05-02 ENCOUNTER — Encounter (HOSPITAL_COMMUNITY): Payer: Self-pay

## 2023-05-02 DIAGNOSIS — N509 Disorder of male genital organs, unspecified: Secondary | ICD-10-CM | POA: Diagnosis not present

## 2023-05-02 DIAGNOSIS — N5089 Other specified disorders of the male genital organs: Secondary | ICD-10-CM

## 2023-05-02 DIAGNOSIS — N50812 Left testicular pain: Secondary | ICD-10-CM | POA: Insufficient documentation

## 2023-05-02 DIAGNOSIS — I10 Essential (primary) hypertension: Secondary | ICD-10-CM | POA: Insufficient documentation

## 2023-05-02 DIAGNOSIS — Z21 Asymptomatic human immunodeficiency virus [HIV] infection status: Secondary | ICD-10-CM | POA: Diagnosis not present

## 2023-05-02 NOTE — Discharge Instructions (Addendum)
You have been seen today for your complaint of left testicle swelling. You left before your workup was complete. This can be very dangerous. Your imaging is still pending Follow up with: Dr. Liliane Shi. He is a urologist. Call to schedule a follow up visit. Please seek immediate medical care if you develop any of the following symptoms: Develop a lot of pain or your pain becomes worse. Have chills. Have a high fever. At this time there does not appear to be the presence of an emergent medical condition, however there is always the potential for conditions to change. Please read and follow the below instructions.  Do not take your medicine if  develop an itchy rash, swelling in your mouth or lips, or difficulty breathing; call 911 and seek immediate emergency medical attention if this occurs.  You may review your lab tests and imaging results in their entirety on your MyChart account.  Please discuss all results of fully with your primary care provider and other specialist at your follow-up visit.  Note: Portions of this text may have been transcribed using voice recognition software. Every effort was made to ensure accuracy; however, inadvertent computerized transcription errors may still be present.

## 2023-05-02 NOTE — ED Notes (Signed)
Encouraged pt once again to use the urinal. Pt inquiring again while we are getting urine. Advised him again that we are checking for infection. Urinal remains at bedside. Pt has been eating and drinking fluids while in the ED. Provided privacy to urinate.

## 2023-05-02 NOTE — ED Notes (Signed)
U/S tech at bedside at this time performing exam.

## 2023-05-02 NOTE — ED Provider Notes (Signed)
[place] EMERGENCY DEPARTMENT AT [facility] Provider Note   CSN: [id number] Arrival date & time: 05/02/23  0149     History  Chief Complaint  Patient presents with   Testicle Pain    [name] is a 37 y.o. male.  With history of HIV, depression, polysubstance abuse, ADHD presenting to the ED for evaluation of left testicle mass.  He states he has had this for a few months now.  He has been seen prior and was encouraged to follow-up with a specialist but states he has never gotten around to it.  He states since his last presentation, the mass has doubled in size.  It is localized to the left testicle.  No overlying rashes or lesions that he is aware of.  No testicle pain, dysuria, frequency, urgency, penile discharge, pubic rashes or lesions.   Testicle Pain       Home Medications Prior to Admission medications   Medication Sig Start Date End Date Taking? Authorizing Provider  acetaminophen (TYLENOL) 500 MG tablet Take 500-1,000 mg by mouth every 6 (six) hours as needed for mild pain.    [provider]  Darunavir-Cobicistat-Emtricitabine-Tenofovir Alafenamide (SYMTUZA) 800-150-200-10 MG TABS Take 1 tablet by mouth daily with breakfast. 02/28/23   Kuppelweiser, Cassie L, RPH-CPP  diphenhydrAMINE (BENADRYL) 25 MG tablet Take 1 tablet (25 mg total) by mouth every 6 (six) hours as needed. 02/26/23   Charlynne Pander, MD  omeprazole (PRILOSEC) 20 MG capsule Take 1 capsule (20 mg total) by mouth daily. Patient not taking: Reported on 02/14/2019 06/24/18 02/14/19  Roxy Horseman, PA-C      Allergies    Patient has no known allergies.    Review of Systems   Review of Systems  Genitourinary:  Positive for scrotal swelling.    Physical Exam Updated Vital Signs BP 139/89 (BP Location: Right Arm)   Pulse 90   Temp 97.7 F (36.5 C)   Resp 18   Ht 6\' 1"  (1.854 m)   Wt 81.2 kg   SpO2 100%   BMI 23.62 kg/m  Physical Exam Vitals and nursing note reviewed.  Exam conducted with a chaperone present Presenter, broadcasting, RN).  Constitutional:      General: He is not in acute distress.    Appearance: Normal appearance. He is normal weight. He is not ill-appearing.  HENT:     Head: Normocephalic and atraumatic.  Pulmonary:     Effort: Pulmonary effort is normal. No respiratory distress.  Abdominal:     General: Abdomen is flat.  Genitourinary:    Comments: No significant swelling or masses appreciated of the left testicle when compared contralaterally.  No overlying skin changes of the scrotum.  No TTP.  No penile or pubic rashes or lesions.  No penile discharge or bleeding. Musculoskeletal:        General: Normal range of motion.     Cervical back: Neck supple.  Skin:    General: Skin is warm and dry.  Neurological:     Mental Status: He is alert and oriented to person, place, and time.  Psychiatric:        Mood and Affect: Mood normal.        Behavior: Behavior normal.     ED Results / Procedures / Treatments   Labs (all labs ordered are listed, but only abnormal results are displayed) Labs Reviewed - No data to display  EKG None  Radiology No results found.  Procedures Procedures    Medications  Ordered in ED Medications - No data to display  ED Course/ Medical Decision Making/ A&P                                 Medical Decision Making Amount and/or Complexity of Data Reviewed Labs: ordered. Radiology: ordered.   This patient presents to the ED for concern of left testicle swelling, this involves an extensive number of treatment options, and is a complaint that carries with it a high risk of complications and morbidity.  The differential diagnosis of Emergent testicle pain includes but is not limited to Cellulitis, Fournier's gangrene, epididymitis, orchitis, testicular torsion, appendage torsion, trauma, indirect hernia.   My initial workup includes urinalysis, [place]  Additional history obtained from: Nursing notes from this  visit. Previous records within EMR system ER visit on 03/11/2023 for similar  I ordered, reviewed and interpreted labs which include: Urinalysis.  Patient declined  I ordered imaging studies including scrotal ultrasound I independently visualized and interpreted imaging which showed pending when patient eloped  Afebrile, hemodynamically stable.  37 year old male presenting to the ED for evaluation of left testicular swelling.  He states he has had a mass on the left testicle for quite some time now.  He states since his evaluation approximately 2 months ago the mass has doubled in size.  On my exam, I do not appreciate any significant mass or swelling of the left testicle.  I ordered an ultrasound of the scrotum to fully evaluate this.  Patient eloped from the department prior to this being resulted.  He also declined to provide a urine specimen here in the emergency department.  I was unable to speak with the patient prior to him eloping.  Overall I have low suspicion for acute emergent abnormalities, however I was unable to discuss findings with patient.  Note: Portions of this report may have been transcribed using voice recognition software. Every effort was made to ensure accuracy; however, inadvertent computerized transcription errors may still be present.        Final Clinical Impression(s) / ED Diagnoses Final diagnoses:  None    Rx / DC Orders ED Discharge Orders     None         Mora Bellman 05/02/23 6045    Dione Booze, MD 05/02/23 [phone]

## 2023-05-02 NOTE — ED Triage Notes (Signed)
Pt complaining of a lump on the left testicle that he said is double in size. Has not made any of his follow up appointments.

## 2023-05-02 NOTE — ED Notes (Signed)
Chaperoned while PA performed GU exam.

## 2023-05-02 NOTE — ED Notes (Signed)
Pt noted gathering his belongings to walk out of the department. Pt states he was leaving and wouldn't provide any other information to staff. PA made aware.

## 2023-05-12 ENCOUNTER — Other Ambulatory Visit: Payer: Self-pay

## 2023-05-12 ENCOUNTER — Telehealth: Payer: Self-pay

## 2023-05-12 ENCOUNTER — Other Ambulatory Visit (HOSPITAL_COMMUNITY): Payer: Self-pay

## 2023-05-12 DIAGNOSIS — Z21 Asymptomatic human immunodeficiency virus [HIV] infection status: Secondary | ICD-10-CM

## 2023-05-12 MED ORDER — SYMTUZA 800-150-200-10 MG PO TABS
1.0000 | ORAL_TABLET | Freq: Every day | ORAL | 0 refills | Status: DC
Start: 2023-05-12 — End: 2023-10-10
  Filled 2023-05-25 – 2023-05-26 (×2): qty 30, 30d supply, fill #0

## 2023-05-12 NOTE — Progress Notes (Deleted)
Specialty Pharmacy Initial Fill Coordination Note  [name] is a 37 y.o. male contacted today regarding refills of specialty medication(s) Darun-Cobic-Emtricit-Tenofaf   Patient requested [name] at [facility] at [place] date: 05/12/23   Medication will be filled on 05/12/23.   Patient is aware of $0 copayment.

## 2023-05-12 NOTE — Progress Notes (Signed)
Patient came to front desk and requested Symtuza Rx be sent to [facility].   Sandie Ano, RN

## 2023-05-12 NOTE — Telephone Encounter (Signed)
Patient will need to fill Symtuza at [facility] pharmacy, cannot use [facility]. Called Emrik to let him know, no answer and no secure voicemail.   Sandie Ano, RN

## 2023-05-16 ENCOUNTER — Other Ambulatory Visit (HOSPITAL_COMMUNITY): Payer: Self-pay

## 2023-05-19 ENCOUNTER — Ambulatory Visit: Payer: 59 | Admitting: Family

## 2023-05-25 ENCOUNTER — Other Ambulatory Visit: Payer: Self-pay | Admitting: Pharmacist

## 2023-05-25 ENCOUNTER — Other Ambulatory Visit: Payer: Self-pay

## 2023-05-25 ENCOUNTER — Other Ambulatory Visit (HOSPITAL_COMMUNITY): Payer: Self-pay

## 2023-05-25 NOTE — Progress Notes (Signed)
Specialty Pharmacy Initiation Note   [name] is a 37 y.o. male who will be followed by the specialty pharmacy service for RxSp HIV    Review of administration, indication, effectiveness, safety, potential side effects, storage/disposable, and missed dose instructions occurred today for patient's specialty medication(s) No data recorded    Patient/Caregiver did not have any additional questions or concerns.   Patient's therapy is appropriate to: Continue    Goals Addressed             This Visit's Progress    Achieve Undetectable HIV Viral Load < 20       Patient is not on track and no change. Patient will work on increased adherence      Comply with lab assessments       Patient is not on track and no change. Patient will adhere to provider and/or lab appointments      Increase CD4 count until steady state       Patient is on track. Patient will work on increased adherence      Maintain optimal adherence to therapy       Patient is not on track and no change. Patient will work on increased adherence         Jennette Kettle Specialty Pharmacist

## 2023-05-25 NOTE — Progress Notes (Addendum)
Specialty Pharmacy Initial Fill Coordination Note  [name] is a 37 y.o. male contacted today regarding refills of specialty medication(s) SYMTUZA  Patient requested SYMTUZA Delivery date: 05/26/23 Verified address:[address]  [place]   Medication will be filled on 05/26/23.   Patient is aware of $0 copayment.

## 2023-05-26 ENCOUNTER — Other Ambulatory Visit (HOSPITAL_COMMUNITY): Payer: Self-pay

## 2023-05-26 ENCOUNTER — Other Ambulatory Visit: Payer: Self-pay

## 2023-05-29 ENCOUNTER — Telehealth: Payer: Self-pay

## 2023-05-29 NOTE — Telephone Encounter (Signed)
[facility] Patient Advocate Encounter  Patient's medications SYMTUZA have been couriered to [facility] from [facility] and will be picked up from [facility].  Kae Heller, CPhT Specialty Pharmacy Patient [facility] for Infectious Disease Phone: [phone] Fax:  [phone]

## 2023-05-30 ENCOUNTER — Ambulatory Visit: Payer: 59 | Admitting: Infectious Diseases

## 2023-06-12 ENCOUNTER — Other Ambulatory Visit (HOSPITAL_COMMUNITY): Payer: Self-pay

## 2023-06-14 ENCOUNTER — Ambulatory Visit: Payer: 59 | Admitting: Family

## 2023-06-16 ENCOUNTER — Other Ambulatory Visit (HOSPITAL_COMMUNITY): Payer: Self-pay

## 2023-07-04 ENCOUNTER — Other Ambulatory Visit: Payer: Self-pay

## 2023-08-01 ENCOUNTER — Other Ambulatory Visit: Payer: Self-pay

## 2023-08-03 ENCOUNTER — Other Ambulatory Visit: Payer: Self-pay

## 2023-08-14 ENCOUNTER — Other Ambulatory Visit: Payer: Self-pay

## 2023-08-18 ENCOUNTER — Other Ambulatory Visit: Payer: Self-pay

## 2023-08-21 ENCOUNTER — Other Ambulatory Visit (HOSPITAL_COMMUNITY): Payer: Self-pay

## 2023-08-21 ENCOUNTER — Other Ambulatory Visit: Payer: Self-pay | Admitting: Pharmacist

## 2023-08-21 ENCOUNTER — Other Ambulatory Visit: Payer: Self-pay

## 2023-08-21 DIAGNOSIS — Z21 Asymptomatic human immunodeficiency virus [HIV] infection status: Secondary | ICD-10-CM

## 2023-08-22 ENCOUNTER — Ambulatory Visit: Payer: 59 | Admitting: Internal Medicine

## 2023-08-22 ENCOUNTER — Other Ambulatory Visit: Payer: Self-pay

## 2023-08-22 NOTE — Progress Notes (Signed)
Refills were denied - patient needs appointment. Phone not in service.

## 2023-10-09 NOTE — Progress Notes (Unsigned)
 HPI: [name] is a 38 y.o. male who presents to the [facility] pharmacy clinic for HIV follow-up.  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   DARUNAVIR-COBICISTAT-EMTRICITABINE-TENOFOVIR ALAFENAMIDE (SYMTUZA) 800-150-200-10 MG TABS    Take 1 tablet by mouth daily with breakfast.   DIPHENHYDRAMINE (BENADRYL) 25 MG TABLET    Take 1 tablet (25 mg total) by mouth every 6 (six) hours as needed.  Modified Medications   No medications on file  Discontinued Medications   No medications on file    Labs: Lab Results  Component Value Date   HIV1RNAQUANT 100,000 (H) 02/28/2023   HIV1RNAQUANT 508 (H) 10/10/2022   HIV1RNAQUANT 6,090 (H) 08/05/2022   CD4TABS 550 02/01/2023   CD4TABS 755 10/10/2022   CD4TABS 600 03/16/2021    RPR and STI Lab Results  Component Value Date   LABRPR REACTIVE (A) 02/28/2023   LABRPR REACTIVE (A) 10/10/2022   LABRPR REACTIVE (A) 08/05/2022   LABRPR REACTIVE (A) 03/16/2021   LABRPR Reactive (A) 10/15/2020   RPRTITER 1:2 (H) 02/28/2023   RPRTITER 1:2 (H) 10/10/2022   RPRTITER 1:2 (H) 08/05/2022   RPRTITER 1:8 (H) 03/16/2021   RPRTITER 1:2 (H) 05/12/2020     STI Results GC GC CT CT  Latest Ref Rng & Units  NEGATIVE  NEGATIVE  10/15/2020  8:43 PM Negative   Negative    05/12/2020 10:42 AM Negative   Negative    01/13/2020  9:13 AM Negative    Negative    Negative   Negative    Positive    Negative    10/09/2019 10:24 AM Negative   Negative    02/24/2019 12:00 AM Negative   Negative    06/06/2018 12:00 AM Negative   Negative    10/14/2017 12:00 AM Negative   Negative    01/26/2014  1:45 PM  NEGATIVE   NEGATIVE   07/23/2010  6:11 PM   NEGATIVE (NOTE)  Testing performed using the BD ProbeTec Qx Chlamydia trachomatis and Neisseria gonorrhea amplified DNA assay.  Performed at:  [facility]               [address]                [place], [place] [address]               [id number]    12/09/2009  1:10 AM   NEGATIVE (NOTE)  Testing performed using the BD Probetec ET Chlamydia trachomatis and Neisseria gonorrhea amplified DNA assay.    03/08/2007  3:27 AM  NEGATIVE (NOTE)  Testing performed using the BD Probetec ET Chlamydia trachomatis and Neisseria gonorrhea amplified DNA assay.      Hepatitis B Lab Results  Component Value Date   HEPBSAB REACTIVE (A) 10/09/2019   HEPBSAG NON-REACTIVE 10/10/2022   HEPBCAB NON-REACTIVE 10/09/2019   Hepatitis C Lab Results  Component Value Date   HEPCAB REACTIVE (A) 10/09/2019   HCVRNAPCRQN <15 02/28/2023   Hepatitis A Lab Results  Component Value Date   HAV NON-REACTIVE 10/09/2019   Lipids: Lab Results  Component Value Date   CHOL 137 08/05/2022   TRIG 185 (H) 08/05/2022   HDL 40 08/05/2022   CHOLHDL 3.4 08/05/2022   VLDL 31 07/10/2019   LDLCALC 71 08/05/2022    Current HIV Regimen: Symtuza  Assessment: [name] is here today to follow up for his HIV infection. He has missed multiple appointments and was last seen by me on 02/28/23 where he was off of his Symtuza for ~2 months. His HIV viral load at that time was 100,000. He has  not seen a provider in a year (10/10/22). His fill history is not great with Symtuza (08/10/22, 10/10/22, 12/16/22, 02/28/23, 04/03/23, 05/26/23).   He is not sure the last time he took his HIV medications. He admits that he has been taking his friend's Biktarvy off and on as well. He estimates that he will take Biktarvy/Symtuza for a week then stop and then restart whenever he can get more from his friend. I discussed how dangerous this was and the risk of resistance developing. Also discussed how it is not a good idea to share medications with anyone else and to take multiple different medications sporadically. 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 I asked him to either take it every day or none at all. Those were his only real 2 options.  He is having shortness of breath and coughing for the last several months. He also complains of a headache x 4 days that has resolved as of this morning. No fevers, chills, chest pain, nausea, vomiting, diarrhea, or blurry vision. I checked his vitals and his BP was 123/86 and HR was 89. His O2 saturation was 98% so I double PCP pneumonia. His las CD4 was surprisingly good at 550.   I asked him what it was going to take to get him to take his medications every day and he is not sure. He has no housing right now. He does not have a place to sleep tonight and traveled to clinic today with his suitcase. He details a friend who has HIV and had to go to the ED recently because he was so sick from not taking medications. I explained how this happens and mentioned that this could be him if he does not start taking care of himself. He is still using meth daily - injecting, smoking, etc. "Any way I can ingest it".   We do not have any Symtuza here for him today, so he will go to [facility] to pick up a fill today. I will have him come back and see me in 3-4 weeks so I can recheck his lab work, see  how he is doing, and he can pick up his Symtuiza from [place] moving forward (he prefers to pick up in clinic). Will check all labs today including a resistance panel. He has not had any sexual partner lately and politely declines STI testing. Will  see him back in 3-4 weeks.  Plan: - HIV RNA with genotype, CD4 count, RPR, CBC with diff, CMP, and Hepatitis C RNA today - Pick up Symtuza from [facility] today - Follow up with me again on 11/08/23  Karlynn Furrow L. Nera Haworth, PharmD, BCIDP, AAHIVP, CPP Clinical Pharmacist Practitioner Infectious Diseases Clinical Pharmacist [facility] 10/09/2023, 2:07 PM

## 2023-10-10 ENCOUNTER — Ambulatory Visit (INDEPENDENT_AMBULATORY_CARE_PROVIDER_SITE_OTHER): Admitting: Pharmacist

## 2023-10-10 ENCOUNTER — Other Ambulatory Visit: Payer: Self-pay

## 2023-10-10 DIAGNOSIS — Z0000 Encounter for general adult medical examination without abnormal findings: Secondary | ICD-10-CM | POA: Diagnosis not present

## 2023-10-10 DIAGNOSIS — B20 Human immunodeficiency virus [HIV] disease: Secondary | ICD-10-CM | POA: Diagnosis present

## 2023-10-10 DIAGNOSIS — Z113 Encounter for screening for infections with a predominantly sexual mode of transmission: Secondary | ICD-10-CM

## 2023-10-10 DIAGNOSIS — Z21 Asymptomatic human immunodeficiency virus [HIV] infection status: Secondary | ICD-10-CM

## 2023-10-10 MED ORDER — SYMTUZA 800-150-200-10 MG PO TABS: 1.0000 | ORAL_TABLET | Freq: Every day | ORAL | 1 refills | Status: DC

## 2023-10-10 NOTE — Progress Notes (Addendum)
 Patient will be filling at [facility] this month and will resume filling with [facility] in April of 2025   Patient is filling at [facility] this time (one time fill) because he has the transportation to pick-up meds today  - Per [employer] with patient and he is aware that he will pick up meds from [facility] resuming in April 2025  Please do not dis-enroll this patient.Thank You!

## 2023-10-11 ENCOUNTER — Ambulatory Visit
Admission: EM | Admit: 2023-10-11 | Discharge: 2023-10-11 | Disposition: A | Discharge status: Home / Self Care | Attending: Family Medicine | Admitting: Family Medicine

## 2023-10-11 DIAGNOSIS — R2232 Localized swelling, mass and lump, left upper limb: Secondary | ICD-10-CM | POA: Diagnosis not present

## 2023-10-11 DIAGNOSIS — N5089 Other specified disorders of the male genital organs: Secondary | ICD-10-CM

## 2023-10-11 NOTE — ED Triage Notes (Signed)
 Patient presents to UC for possible abscess noted 3 weeks ago. States "I was getting high with some friends and they missed when inserting the needle." Has applied warm compresses with some improvement.   Denies fever or drainage.

## 2023-10-12 LAB — T-HELPER CELLS (CD4) COUNT (NOT AT ARMC)
CD4 % Helper T Cell: 38 % (ref 33–65)
CD4 T Cell Abs: 846 /uL (ref 400–1790)

## 2023-10-12 NOTE — ED Provider Notes (Signed)
 [facility]   [id number] 10/11/23 Arrival Time: 1317  ASSESSMENT & PLAN:  1. Forearm mass, left   2. Scrotal mass    Observation for sub-cm mass on L forearm; no abscess. For L scrotal mass; urology referral.   Follow-up Information     Schedule an appointment as soon as possible for a visit  with Heloise Purpura, MD.   Specialty: Urology Contact information: [address] [place] [address] [phone]                 Reviewed expectations re: course of current medical issues. Questions answered. Outlined signs and symptoms indicating need for more acute intervention. Understanding verbalized. After Visit Summary given.   SUBJECTIVE: History from: Patient. [name] is a 38 y.o. male. Patient presents to UC for possible abscess noted 3 weeks ago. States "I was getting high with some friends and they missed when inserting the needle." Has applied warm compresses with some improvement.   Denies fever or drainage.  Denies: fever. Normal PO intake without n/v/d. Has also noted possible mass in L scrotum; unsure how long; non-painful OBJECTIVE:  Vitals:   10/11/23 1445  BP: 137/84  Pulse: 80  Resp: 16  Temp: 97.8 F (36.6 C)  TempSrc: Oral  SpO2: 98%    General appearance: alert; no distress Eyes: PERRLA; EOMI; conjunctiva normal Lungs: speaks full sentences without difficulty; unlabored Extremities: no edema; palpable firm sub-cm mass on L forearm; no overlying erythema GU: sub-cm mass palpated in L scrotum; non-tender Skin: warm and dry Neurologic: normal gait Psychological: alert and cooperative; normal mood and affect  Labs: Results for orders placed or performed in visit on 10/10/23  T-helper cells (CD4) count (not at [facility])   Collection Time: 10/10/23  1:40 PM  Result Value Ref Range   CD4 T Cell Abs 846 400 - 1,790 /uL   CD4 % Helper T Cell 38 33 - 65 %  RPR   Collection Time: 10/10/23  1:46 PM  Result Value Ref Range   RPR  Ser Ql REACTIVE (A) NON-REACTIVE  CBC with Differential   Collection Time: 10/10/23  1:46 PM  Result Value Ref Range   WBC 6.6 3.8 - 10.8 Thousand/uL   RBC 5.19 4.20 - 5.80 Million/uL   Hemoglobin 15.9 13.2 - 17.1 g/dL   HCT 21.3 08.6 - 57.8 %   MCV 88.6 80.0 - 100.0 fL   MCH 30.6 27.0 - 33.0 pg   MCHC 34.6 32.0 - 36.0 g/dL   RDW 46.9 62.9 - 52.8 %   Platelets 347 140 - 400 Thousand/uL   MPV 8.7 7.5 - 12.5 fL   Neutro Abs 3,399 1,500 - 7,800 cells/uL   Absolute Lymphocytes 2,396 850 - 3,900 cells/uL   Absolute Monocytes 581 200 - 950 cells/uL   Eosinophils Absolute 172 15 - 500 cells/uL   Basophils Absolute 53 0 - 200 cells/uL   Neutrophils Relative % 51.5 %   Total Lymphocyte 36.3 %   Monocytes Relative 8.8 %   Eosinophils Relative 2.6 %   Basophils Relative 0.8 %  Comprehensive metabolic panel   Collection Time: 10/10/23  1:46 PM  Result Value Ref Range   Glucose, Bld 66 65 - 99 mg/dL   BUN 17 7 - 25 mg/dL   Creat 4.13 2.44 - 0.10 mg/dL   eGFR 91 > OR = 60 UV/OZD/6.64Q0   BUN/Creatinine Ratio SEE NOTE: 6 - 22 (calc)   Sodium 139 135 - 146 mmol/L  Potassium 3.6 3.5 - 5.3 mmol/L   Chloride 100 98 - 110 mmol/L   CO2 27 20 - 32 mmol/L   Calcium 9.7 8.6 - 10.3 mg/dL   Total Protein 8.1 6.1 - 8.1 g/dL   Albumin 4.5 3.6 - 5.1 g/dL   Globulin 3.6 1.9 - 3.7 g/dL (calc)   AG Ratio 1.3 1.0 - 2.5 (calc)   Total Bilirubin 0.6 0.2 - 1.2 mg/dL   Alkaline phosphatase (APISO) 79 36 - 130 U/L   AST 21 10 - 40 U/L   ALT 13 9 - 46 U/L  Rpr titer   Collection Time: 10/10/23  1:46 PM  Result Value Ref Range   RPR Titer 1:2 (H)    Labs Reviewed - No data to display  Imaging: No results found.  No Known Allergies  Past Medical History:  Diagnosis Date   ADHD (attention deficit hyperactivity disorder)    Chronic hepatitis C without hepatic coma (HCC) 05/12/2020   Drug abuse (HCC)    meth, marijuana   HIV positive (HCC)    Social History   Socioeconomic History   Marital  status: Single    Spouse name: Not on file   Number of children: Not on file   Years of education: Not on file   Highest education level: Not on file  Occupational History   Not on file  Tobacco Use   Smoking status: Every Day    Current packs/day: 1.00    Average packs/day: 1 pack/day for 10.0 years (10.0 ttl pk-yrs)    Types: Cigarettes   Smokeless tobacco: Never  Vaping Use   Vaping status: Never Used  Substance and Sexual Activity   Alcohol use: Not Currently   Drug use: Yes    Types: Methamphetamines   Sexual activity: Yes    Birth control/protection: None    Comment: declined condoms  Other Topics Concern   Not on file  Social History Narrative   Not on file   Social Drivers of Health   Financial Resource Strain: Not on file  Food Insecurity: Not on file  Transportation Needs: Not on file  Physical Activity: Not on file  Stress: Not on file  Social Connections: Unknown (09/29/2022)   Received from [facility]   Social Network    Social Network: Not on file  Intimate Partner Violence: Unknown (09/29/2022)   Received from [facility]   HITS    Physically Hurt: Not on file    Insult or Talk Down To: Not on file    Threaten Physical Harm: Not on file    Scream or Curse: Not on file   Family History  Problem Relation Age of Onset   Diabetes Mother    Hypertension Mother    Cancer Mother    Cancer Father    Diabetes Father    Hypertension Father    Past Surgical History:  Procedure Laterality Date   ORIF MANDIBULAR FRACTURE Right 10/16/2020   Procedure: OPEN REDUCTION INTERNAL FIXATION (ORIF) SUBCONDYLAR FRACTURE WITH REPAIR OF 3CM CHIN LACERATION;  Surgeon: Lovena Neighbours, MD;  Location: [facility] OR;  Service: Oral Surgery;  Laterality: Right;     Mardella Layman, MD 10/12/23 1005

## 2023-10-26 LAB — COMPREHENSIVE METABOLIC PANEL WITH GFR
AG Ratio: 1.3 (calc) (ref 1.0–2.5)
ALT: 13 U/L (ref 9–46)
AST: 21 U/L (ref 10–40)
Albumin: 4.5 g/dL (ref 3.6–5.1)
Alkaline phosphatase (APISO): 79 U/L (ref 36–130)
BUN: 17 mg/dL (ref 7–25)
CO2: 27 mmol/L (ref 20–32)
Calcium: 9.7 mg/dL (ref 8.6–10.3)
Chloride: 100 mmol/L (ref 98–110)
Creat: 1.07 mg/dL (ref 0.60–1.26)
Globulin: 3.6 g/dL (ref 1.9–3.7)
Glucose, Bld: 66 mg/dL (ref 65–99)
Potassium: 3.6 mmol/L (ref 3.5–5.3)
Sodium: 139 mmol/L (ref 135–146)
Total Bilirubin: 0.6 mg/dL (ref 0.2–1.2)
Total Protein: 8.1 g/dL (ref 6.1–8.1)
eGFR: 91 mL/min/{1.73_m2} (ref >=60)

## 2023-10-26 LAB — CBC WITH DIFFERENTIAL/PLATELET
Absolute Lymphocytes: 2396 {cells}/uL (ref 850–3900)
Absolute Monocytes: 581 {cells}/uL (ref 200–950)
Basophils Absolute: 53 {cells}/uL (ref 0–200)
Basophils Relative: 0.8 %
Eosinophils Absolute: 172 {cells}/uL (ref 15–500)
Eosinophils Relative: 2.6 %
HCT: 46 % (ref 38.5–50.0)
Hemoglobin: 15.9 g/dL (ref 13.2–17.1)
MCH: 30.6 pg (ref 27.0–33.0)
MCHC: 34.6 g/dL (ref 32.0–36.0)
MCV: 88.6 fL (ref 80.0–100.0)
MPV: 8.7 fL (ref 7.5–12.5)
Monocytes Relative: 8.8 %
Neutro Abs: 3399 {cells}/uL (ref 1500–7800)
Neutrophils Relative %: 51.5 %
Platelets: 347 10*3/uL (ref 140–400)
RBC: 5.19 10*6/uL (ref 4.20–5.80)
RDW: 13.2 % (ref 11.0–15.0)
Total Lymphocyte: 36.3 %
WBC: 6.6 10*3/uL (ref 3.8–10.8)

## 2023-10-26 LAB — RPR: RPR Ser Ql: REACTIVE — AB

## 2023-10-26 LAB — HIV-1 INTEGRASE GENOTYPE

## 2023-10-26 LAB — HIV-1 GENOTYPE: HIV-1 Genotype: DETECTED — AB

## 2023-10-26 LAB — RPR TITER: RPR Titer: 1:2 {titer} — ABNORMAL HIGH

## 2023-10-26 LAB — HIV RNA, RTPCR W/R GT (RTI, PI,INT)
HIV 1 RNA Quant: 57600 {copies}/mL — ABNORMAL HIGH
HIV-1 RNA Quant, Log: 4.76 {Log_copies}/mL — ABNORMAL HIGH

## 2023-10-26 LAB — HEPATITIS C RNA QUANTITATIVE
HCV Quantitative Log: <1.18 {Log_IU}/mL
HCV RNA, PCR, QN: <15 [IU]/mL

## 2023-10-26 LAB — T PALLIDUM AB: T Pallidum Abs: POSITIVE — AB

## 2023-10-31 ENCOUNTER — Other Ambulatory Visit: Payer: Self-pay | Admitting: Pharmacist

## 2023-10-31 ENCOUNTER — Other Ambulatory Visit: Payer: Self-pay

## 2023-10-31 ENCOUNTER — Other Ambulatory Visit (HOSPITAL_COMMUNITY): Payer: Self-pay

## 2023-10-31 DIAGNOSIS — B20 Human immunodeficiency virus [HIV] disease: Secondary | ICD-10-CM

## 2023-10-31 DIAGNOSIS — Z21 Asymptomatic human immunodeficiency virus [HIV] infection status: Secondary | ICD-10-CM

## 2023-10-31 MED ORDER — SYMTUZA 800-150-200-10 MG PO TABS
1.0000 | ORAL_TABLET | Freq: Every day | ORAL | 2 refills | Status: AC
Start: 2023-10-31 — End: ?
  Filled 2023-10-31: qty 30, 30d supply, fill #0
  Filled 2023-11-24: qty 30, 30d supply, fill #1

## 2023-10-31 NOTE — Progress Notes (Signed)
 Specialty Pharmacy Refill Coordination Note  [name] is a 38 y.o. male contacted today regarding refills of specialty medication(s) Darun-Cobic-Emtricit-TenofAF [name])   Patient requested Courier to Provider Office   Delivery date: 11/02/23   Verified address: [facility] [address] [name] [address]   Medication will be filled on 11/01/23.

## 2023-11-01 ENCOUNTER — Other Ambulatory Visit: Payer: Self-pay

## 2023-11-02 ENCOUNTER — Telehealth: Payer: Self-pay

## 2023-11-02 NOTE — Telephone Encounter (Signed)
 [facility] Patient Advocate Encounter  Patient's medications SYMTUZA have been couriered to [facility] from [facility] and will be picked up at [facility].  Verline Glow, CPhT Specialty Pharmacy Patient [facility] for Infectious Disease Phone: [phone] Fax:  [phone]

## 2023-11-06 ENCOUNTER — Other Ambulatory Visit: Payer: Self-pay
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
## 2023-11-08 ENCOUNTER — Emergency Department (HOSPITAL_COMMUNITY): Admission: EM | Admit: 2023-11-08 | Discharge: 2023-11-08 | Discharge status: Against Medical Advice

## 2023-11-08 ENCOUNTER — Other Ambulatory Visit: Payer: Self-pay

## 2023-11-08 ENCOUNTER — Ambulatory Visit (INDEPENDENT_AMBULATORY_CARE_PROVIDER_SITE_OTHER): Admitting: Pharmacist

## 2023-11-08 DIAGNOSIS — B20 Human immunodeficiency virus [HIV] disease: Secondary | ICD-10-CM | POA: Diagnosis present

## 2023-11-08 NOTE — ED Notes (Signed)
 Fourth call for triage no answer

## 2023-11-08 NOTE — Patient Instructions (Addendum)
 [facility] [address] [place], [place], [place] [address]  [facility] [address] Entrance A, [facility] floor - [facility] [address] [place] [address] [place] [address]

## 2023-11-08 NOTE — Progress Notes (Signed)
 c

## 2023-11-16 ENCOUNTER — Telehealth: Payer: Self-pay

## 2023-11-16 ENCOUNTER — Other Ambulatory Visit

## 2023-11-16 NOTE — Telephone Encounter (Signed)
 Sent patient a Clinical cytogeneticist message regarding missed lab appointment. Hendricks Locker, LPN

## 2023-11-24 ENCOUNTER — Other Ambulatory Visit: Payer: Self-pay
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
## 2023-11-27 ENCOUNTER — Telehealth: Payer: Self-pay

## 2023-11-27 ENCOUNTER — Ambulatory Visit: Admitting: Pharmacist

## 2023-11-27 DIAGNOSIS — Z21 Asymptomatic human immunodeficiency virus [HIV] infection status: Secondary | ICD-10-CM

## 2023-11-27 DIAGNOSIS — B20 Human immunodeficiency virus [HIV] disease: Secondary | ICD-10-CM

## 2023-11-27 NOTE — Telephone Encounter (Signed)
 Detectable Viral Load Intervention (DVL)  Most recent VL:  HIV 1 RNA Quant  Date Value Ref Range Status  10/10/2023 57,600 (H) copies/mL Final  02/28/2023 100,000 (H) copies/mL Final  10/10/2022 508 (H) copies/mL Final    Last Clinic Visit: 11/08/23  Current ART regimen: Symtuza   Appointment status: patient does not have future appointment scheduled   Medication last dispensed (per chart review):   Dispensed Days Supply Quantity Provider Pharmacy  Darunavir -Cobicistat-Emtricitabine-Tenofovir Alafenamide (SYMTUZA ) 800-150-200-10 MG TABS 11/24/2023 30 30 tablet Kuppelweiser, Cassie L, RPH-CPP [place] [facility]  Darunavir -Cobicistat-Emtricitabine-Tenofovir Alafenamide (SYMTUZA ) 800-150-200-10 MG TABS 11/01/2023 30 30 tablet Kuppelweiser, Cassie L, RPH-CPP [place] [facility]  SYMTUZA  TABLETS 10/11/2023 30 30 each Kuppelweiser, Cassie L, RPH-CPP [facility]  Darunavir -Cobicistat-Emtricitabine-Tenofovir Alafenamide (SYMTUZA ) 800-150-200-10 MG TABS 05/26/2023 30 30 tablet Kuppelweiser, Cassie L, RPH-CPP [place] [facility]    Medication Adherence   Not able to assess    Barriers to Care  Not able to assess   Interventions   Called patient to discuss medication adherence and possible barriers to care. Multiple attempts made to reach patient due to being on DVL list. Will mail letter. Will also place referral for [facility] [employer] counselor to assist with adherence.  Julien Odor, RMA

## 2023-11-28 ENCOUNTER — Telehealth: Payer: Self-pay

## 2023-11-28 NOTE — Telephone Encounter (Signed)
 [facility] Patient Advocate Encounter  Patient's medications SYMTUZA have been couriered to [facility] from [facility] and will be picked up at [facility].  Verline Glow, CPhT Specialty Pharmacy Patient [facility] for Infectious Disease Phone: [phone] Fax:  [phone]

## 2023-12-29 ENCOUNTER — Other Ambulatory Visit: Payer: Self-pay | Admitting: Pharmacist

## 2023-12-29 NOTE — Progress Notes (Signed)
 Out of care again. Will inactivate and reinitiate fills if he returns to care.  Lynne Righi L. Iyannah Blake, PharmD, BCIDP, AAHIVP, CPP Infectious Diseases Clinical Pharmacist Practitioner Clinical Pharmacist Lead, Specialty Pharmacy [facility] 12/29/2023, 2:30 PM

## 2024-02-01 ENCOUNTER — Emergency Department (HOSPITAL_COMMUNITY)
Admission: EM | Admit: 2024-02-01 | Discharge: 2024-02-02 | Discharge status: Against Medical Advice | Source: Home / Self Care

## 2024-02-01 ENCOUNTER — Emergency Department (HOSPITAL_COMMUNITY)
Admission: EM | Admit: 2024-02-01 | Discharge: 2024-02-01 | Discharge status: Against Medical Advice | Attending: Emergency Medicine | Admitting: Emergency Medicine

## 2024-02-01 ENCOUNTER — Other Ambulatory Visit: Payer: Self-pay

## 2024-02-01 DIAGNOSIS — T63304A Toxic effect of unspecified spider venom, undetermined, initial encounter: Secondary | ICD-10-CM | POA: Insufficient documentation

## 2024-02-01 DIAGNOSIS — Z5321 Procedure and treatment not carried out due to patient leaving prior to being seen by health care provider: Secondary | ICD-10-CM | POA: Insufficient documentation

## 2024-02-01 DIAGNOSIS — Z23 Encounter for immunization: Secondary | ICD-10-CM | POA: Insufficient documentation

## 2024-02-01 DIAGNOSIS — Z21 Asymptomatic human immunodeficiency virus [HIV] infection status: Secondary | ICD-10-CM | POA: Insufficient documentation

## 2024-02-01 DIAGNOSIS — L0201 Cutaneous abscess of face: Secondary | ICD-10-CM | POA: Diagnosis present

## 2024-02-01 LAB — CBC WITH DIFFERENTIAL/PLATELET
Abs Immature Granulocytes: 0.02 K/uL (ref 0.00–0.07)
Basophils Absolute: 0 K/uL (ref 0.0–0.1)
Basophils Relative: 1 %
Eosinophils Absolute: 0.4 K/uL (ref 0.0–0.5)
Eosinophils Relative: 5 %
HCT: 45.5 % (ref 39.0–52.0)
Hemoglobin: 15.6 g/dL (ref 13.0–17.0)
Immature Granulocytes: 0 %
Lymphocytes Relative: 29 %
Lymphs Abs: 2.5 K/uL (ref 0.7–4.0)
MCH: 31.1 pg (ref 26.0–34.0)
MCHC: 34.3 g/dL (ref 30.0–36.0)
MCV: 90.8 fL (ref 80.0–100.0)
Monocytes Absolute: 0.8 K/uL (ref 0.1–1.0)
Monocytes Relative: 10 %
Neutro Abs: 4.7 K/uL (ref 1.7–7.7)
Neutrophils Relative %: 55 %
Platelets: 274 K/uL (ref 150–400)
RBC: 5.01 MIL/uL (ref 4.22–5.81)
RDW: 13.2 % (ref 11.5–15.5)
WBC: 8.4 K/uL (ref 4.0–10.5)
nRBC: 0 % (ref 0.0–0.2)

## 2024-02-01 LAB — BASIC METABOLIC PANEL WITH GFR
Anion gap: 10 (ref 5–15)
BUN: 9 mg/dL (ref 6–20)
CO2: 27 mmol/L (ref 22–32)
Calcium: 9.5 mg/dL (ref 8.9–10.3)
Chloride: 102 mmol/L (ref 98–111)
Creatinine, Ser: 0.88 mg/dL (ref 0.61–1.24)
GFR, Estimated: >60 mL/min (ref >60)
Glucose, Bld: 88 mg/dL (ref 70–99)
Potassium: 3.9 mmol/L (ref 3.5–5.1)
Sodium: 139 mmol/L (ref 135–145)

## 2024-02-01 LAB — I-STAT CG4 LACTIC ACID, ED: Lactic Acid, Venous: 0.7 mmol/L (ref 0.5–1.9)

## 2024-02-01 MED ORDER — TETANUS-DIPHTH-ACELL PERTUSSIS 5-2.5-18.5 LF-MCG/0.5 IM SUSY
0.5000 mL | PREFILLED_SYRINGE | Freq: Once | INTRAMUSCULAR | Status: AC
  Administered 2024-02-01: 0.5 mL via INTRAMUSCULAR
  Filled 2024-02-01: qty 0.5

## 2024-02-01 NOTE — ED Provider Triage Note (Signed)
 Emergency Medicine Provider Triage Evaluation Note  [name] , a 38 y.o. male  was evaluated in triage.  Pt complains of skin infection. Report a skin infection to face for one week and R forearm for 3 days.  Endorse associated pain.  No treatment tried.  Hx of IVDU and HIV, not complaint with medication.  Unsure last tetanus shot.    Review of Systems  Positive: As above Negative: As above  Physical Exam  There were no vitals taken for this visit. Gen:   Awake, no distress   Resp:  Normal effort  MSK:   Moves extremities without difficulty  Other:    Medical Decision Making  Medically screening exam initiated at 8:08 PM.  Appropriate orders placed.  [name] was informed that the remainder of the evaluation will be completed by another provider, this initial triage assessment does not replace that evaluation, and the importance of remaining in the ED until their evaluation is complete.     Nivia Colon, PA-C 02/01/24 2009

## 2024-02-01 NOTE — ED Notes (Signed)
 RN called pt 3 times

## 2024-02-01 NOTE — ED Triage Notes (Signed)
 REports possible spider bite to face and right forearm

## 2024-02-01 NOTE — ED Triage Notes (Signed)
 Patient reports skin abscess at right forearm and face onset 2 days ago .

## 2024-02-02 ENCOUNTER — Encounter (HOSPITAL_BASED_OUTPATIENT_CLINIC_OR_DEPARTMENT_OTHER): Payer: Self-pay | Admitting: Emergency Medicine

## 2024-02-02 ENCOUNTER — Other Ambulatory Visit: Payer: Self-pay

## 2024-02-02 ENCOUNTER — Other Ambulatory Visit (HOSPITAL_BASED_OUTPATIENT_CLINIC_OR_DEPARTMENT_OTHER): Payer: Self-pay

## 2024-02-02 ENCOUNTER — Emergency Department (HOSPITAL_BASED_OUTPATIENT_CLINIC_OR_DEPARTMENT_OTHER)
Admission: EM | Admit: 2024-02-02 | Discharge: 2024-02-02 | Disposition: A | Discharge status: Home / Self Care | Attending: Emergency Medicine | Admitting: Emergency Medicine

## 2024-02-02 DIAGNOSIS — T63391A Toxic effect of venom of other spider, accidental (unintentional), initial encounter: Secondary | ICD-10-CM | POA: Insufficient documentation

## 2024-02-02 DIAGNOSIS — S0086XA Insect bite (nonvenomous) of other part of head, initial encounter: Secondary | ICD-10-CM

## 2024-02-02 DIAGNOSIS — Z21 Asymptomatic human immunodeficiency virus [HIV] infection status: Secondary | ICD-10-CM | POA: Diagnosis not present

## 2024-02-02 MED ORDER — CEPHALEXIN 500 MG PO CAPS
500.0000 mg | ORAL_CAPSULE | Freq: Two times a day (BID) | ORAL | 0 refills | Status: AC
  Filled 2024-02-02: qty 14, 7d supply, fill #0

## 2024-02-02 NOTE — ED Notes (Signed)
 Pt was called multiple times, no response

## 2024-02-02 NOTE — ED Triage Notes (Signed)
 Pt caox4, ambulatory c/o spider bite to the forehead. Pt first noticed 3 days ago which has been increasing in size and started draining yesterday.

## 2024-02-02 NOTE — ED Provider Notes (Signed)
 [name] EMERGENCY DEPARTMENT AT [facility] Provider Note   CSN: [id number] Arrival date & time: 02/02/24  1010     Patient presents with: Insect Bite   [name] is a 38 y.o. male.   38 year old male with a past medical history of ADHD, chronic hepatitis C, HIV, polysubstance abuse presents to the ED with chief complaint of insect bite to his forehead which occurred approximately 3 days ago.  Patient reports he was sleeping, when suddenly he was bit by a spider on his head, he has been picking at this wound over the last consecutive days.  That has become more swollen, irritated and draining.  Reports this has now increased in size.  He has not tried any medication for improvement in symptoms.  He does have prior history of IV drug use, however reports no injections to his forehead.  He has not run any fevers, no trouble breathing, no pain with eye movement.  The history is provided by the patient.       Prior to Admission medications   Medication Sig Start Date End Date Taking? Authorizing Provider  cephALEXin  (KEFLEX ) 500 MG capsule Take 1 capsule (500 mg total) by mouth 2 (two) times daily for 7 days. 02/02/24 02/09/24 Yes Mikail Goostree, PA-C  acetaminophen  (TYLENOL ) 500 MG tablet Take 500-1,000 mg by mouth every 6 (six) hours as needed for mild pain.    [provider]  Darunavir -Cobicistat-Emtricitabine-Tenofovir Alafenamide (SYMTUZA ) 800-150-200-10 MG TABS Take 1 tablet by mouth daily with breakfast. 10/31/23   Kuppelweiser, Cassie L, RPH-CPP  diphenhydrAMINE  (BENADRYL ) 25 MG tablet Take 1 tablet (25 mg total) by mouth every 6 (six) hours as needed. 02/26/23   Patt Alm Macho, MD  omeprazole  (PRILOSEC) 20 MG capsule Take 1 capsule (20 mg total) by mouth daily. Patient not taking: Reported on 02/14/2019 06/24/18 02/14/19  Vicky Charleston, PA-C    Allergies: Patient has no known allergies.    Review of Systems  Constitutional:  Negative for fever.  Skin:   Positive for wound.    Updated Vital Signs BP (!) 142/94 (BP Location: Right Arm)   Pulse 100   Temp 98.7 F (37.1 C)   Resp 18   Ht 6' 1 (1.854 m)   Wt 81.6 kg   SpO2 100%   BMI 23.75 kg/m   Physical Exam Vitals and nursing note reviewed.  Constitutional:      Appearance: Normal appearance.  HENT:     Head: Normocephalic and atraumatic.      Mouth/Throat:     Mouth: Mucous membranes are moist.  Eyes:     Pupils: Pupils are equal, round, and reactive to light.  Cardiovascular:     Rate and Rhythm: Normal rate.  Pulmonary:     Effort: Pulmonary effort is normal.  Abdominal:     General: Abdomen is flat.  Musculoskeletal:     Cervical back: Normal range of motion and neck supple.  Skin:    General: Skin is warm and dry.     Findings: Erythema present.  Neurological:     Mental Status: He is alert and oriented to person, place, and time.     (all labs ordered are listed, but only abnormal results are displayed) Labs Reviewed - No data to display  EKG: None  Radiology: No results found.   Procedures   Medications Ordered in the ED - No data to display  Medical Decision Making   The patient presents to the ED with a chief complaint of insect bite to his forehead, prior history of IV drug use however denies any injections on his face.  No fevers, no systemic signs.  Localized erythema without any fluctuance or induration noted.  Does appear swollen but no trouble with his eye movement, no trouble with his vision, no troubles with breathing.  We discussed short course of antibiotics to help treat for a likely cellulitis.  He is agreeable of plan and treatment at this time, hemodynamically stable for discharge.  Portions of this note were generated with Scientist, clinical (histocompatibility and immunogenetics). Dictation errors may occur despite best attempts at proofreading.      Final diagnoses:  Insect bite of other part of head, initial encounter     ED Discharge Orders          Ordered    cephALEXin  (KEFLEX ) 500 MG capsule  2 times daily        02/02/24 1111               Niyah Mamaril, PA-C 02/02/24 1112    Dreama Longs, MD 02/03/24 0730

## 2024-02-02 NOTE — ED Notes (Signed)
 Reviewed AVS/discharge instruction with patient. Time allotted for and all questions answered. Patient is agreeable for d/c and escorted to ed exit by staff.

## 2024-02-02 NOTE — Discharge Instructions (Addendum)
 You are you in a prescription for antibiotics in order to help treat your infection, please take 1 tablet twice a day for the next 7 days.  In addition, you may apply hot compress to the area in order to help with drainage.  Experience a fever, worsening symptoms please return to the emergency department.

## 2024-02-03 ENCOUNTER — Other Ambulatory Visit: Payer: Self-pay

## 2024-02-03 ENCOUNTER — Emergency Department (HOSPITAL_BASED_OUTPATIENT_CLINIC_OR_DEPARTMENT_OTHER)
Admission: EM | Admit: 2024-02-03 | Discharge: 2024-02-03 | Disposition: A | Discharge status: Home / Self Care | Attending: Emergency Medicine | Admitting: Emergency Medicine

## 2024-02-03 ENCOUNTER — Encounter (HOSPITAL_BASED_OUTPATIENT_CLINIC_OR_DEPARTMENT_OTHER): Payer: Self-pay | Admitting: Emergency Medicine

## 2024-02-03 DIAGNOSIS — Z21 Asymptomatic human immunodeficiency virus [HIV] infection status: Secondary | ICD-10-CM | POA: Insufficient documentation

## 2024-02-03 DIAGNOSIS — T63481A Toxic effect of venom of other arthropod, accidental (unintentional), initial encounter: Secondary | ICD-10-CM | POA: Diagnosis present

## 2024-02-03 NOTE — ED Notes (Signed)
 AVS provided by edp was reviewed with the pt. Pt verbalized understanding with no additional questions at this time.

## 2024-02-03 NOTE — ED Triage Notes (Signed)
 Pt reports concern for insect bite to buttocks. Area not visualized in triage. Per note pt seen here yesterday for spider bite to forehead - already started abx tx. Current IV drug user - last use meth yesterday

## 2024-02-03 NOTE — ED Provider Notes (Signed)
 [place] EMERGENCY DEPARTMENT AT [facility]  Provider Note  CSN: [id number] Arrival date & time: 02/03/24 0220  History Chief Complaint  Patient presents with   Insect Bite    [name] is a 38 y.o. male with history of HIV (not currently on meds), chronic hepatitis C, substance use disorder (meth) was seen in the ED multiple times in recent days but LWCT until earlier in the day of 7/18 for a suspected facial cellulitis. He was prescribed Keflex  which he reports he was able to fill. He was in his tent a short time prior to arrival when he felt something bite/sting him in the R buttock prompting another ED visit.    Home Medications Prior to Admission medications   Medication Sig Start Date End Date Taking? Authorizing Provider  acetaminophen  (TYLENOL ) 500 MG tablet Take 500-1,000 mg by mouth every 6 (six) hours as needed for mild pain.    [provider]  cephALEXin  (KEFLEX ) 500 MG capsule Take 1 capsule (500 mg total) by mouth 2 (two) times daily for 7 days. 02/02/24 02/09/24  Soto, Johana, PA-C  Darunavir -Cobicistat-Emtricitabine-Tenofovir Alafenamide (SYMTUZA ) 800-150-200-10 MG TABS Take 1 tablet by mouth daily with breakfast. 10/31/23   Kuppelweiser, Cassie L, RPH-CPP  diphenhydrAMINE  (BENADRYL ) 25 MG tablet Take 1 tablet (25 mg total) by mouth every 6 (six) hours as needed. 02/26/23   Patt Alm Macho, MD  omeprazole  (PRILOSEC) 20 MG capsule Take 1 capsule (20 mg total) by mouth daily. Patient not taking: Reported on 02/14/2019 06/24/18 02/14/19  Vicky Charleston, PA-C     Allergies    Patient has no known allergies.   Review of Systems   Review of Systems Please see HPI for pertinent positives and negatives  Physical Exam BP 120/74 (BP Location: Right Arm)   Pulse 100   Temp 97.8 F (36.6 C) (Oral)   Resp 19   SpO2 99%   Physical Exam Vitals and nursing note reviewed.  HENT:     Head: Normocephalic.     Nose: Nose normal.     Comments: Erythema  and swelling to bridge of nose, no drainage or fluctuance Eyes:     Extraocular Movements: Extraocular movements intact.  Pulmonary:     Effort: Pulmonary effort is normal.  Musculoskeletal:        General: Normal range of motion.     Cervical back: Neck supple.  Skin:    Findings: No rash (on exposed skin).     Comments: Small area of erythema to R buttock without central necrosis or drainage  Neurological:     Mental Status: He is alert and oriented to person, place, and time.  Psychiatric:        Mood and Affect: Mood normal.     ED Results / Procedures / Treatments   EKG None  Procedures Procedures  Medications Ordered in the ED Medications - No data to display  Initial Impression and Plan  Patient here for possible insect/spider bite on buttock. No concerning findings on exam and no specific treatment indicated. He has already been prescribed Abx for his facial cellulitis and advised to continue those. He was encouraged to follow up with ID clinic to re-establish care and for long term management of HIV. RTED for any other concerns.   ED Course       MDM Rules/Calculators/A&P Medical Decision Making Problems Addressed: Insect stings, accidental or unintentional, initial encounter: acute illness or injury  Risk Prescription drug management.  Final Clinical Impression(s) / ED Diagnoses Final diagnoses:  Insect stings, accidental or unintentional, initial encounter    Rx / DC Orders ED Discharge Orders     None        Roselyn Carlin NOVAK, MD 02/03/24 [phone]

## 2024-02-15 ENCOUNTER — Other Ambulatory Visit: Payer: Self-pay

## 2024-02-15 ENCOUNTER — Emergency Department (HOSPITAL_BASED_OUTPATIENT_CLINIC_OR_DEPARTMENT_OTHER)
Admission: EM | Admit: 2024-02-15 | Discharge: 2024-02-15 | Disposition: A | Discharge status: Home / Self Care | Attending: Emergency Medicine | Admitting: Emergency Medicine

## 2024-02-15 ENCOUNTER — Encounter (HOSPITAL_BASED_OUTPATIENT_CLINIC_OR_DEPARTMENT_OTHER): Payer: Self-pay

## 2024-02-15 DIAGNOSIS — T63484A Toxic effect of venom of other arthropod, undetermined, initial encounter: Secondary | ICD-10-CM

## 2024-02-15 DIAGNOSIS — Z21 Asymptomatic human immunodeficiency virus [HIV] infection status: Secondary | ICD-10-CM | POA: Diagnosis not present

## 2024-02-15 DIAGNOSIS — T63444A Toxic effect of venom of bees, undetermined, initial encounter: Secondary | ICD-10-CM | POA: Insufficient documentation

## 2024-02-15 MED ORDER — DIPHENHYDRAMINE HCL 25 MG PO CAPS
25.0000 mg | ORAL_CAPSULE | Freq: Once | ORAL | Status: AC
  Administered 2024-02-15: 25 mg via ORAL
  Filled 2024-02-15: qty 1

## 2024-02-15 NOTE — ED Provider Notes (Signed)
 [place] [facility] AT [facility] Provider Note   CSN: [id number] Arrival date & time: 02/15/24  9395     Patient presents with: Insect Bite   [name] is a 38 y.o. male.   HPI     This is a 38 year old male who presents with left hand swelling.  Patient reports that he feels like he may have been bitten by an insect.  He put his left hand into a glove immediate reaction with swelling.  He did not see any insects.  He has had similar reactions in the past.  He states that the glove was his.  Denies any other exposures or contact.  Has not had any fevers.  Does report that the hand feels itchy.  Prior to Admission medications   Medication Sig Start Date End Date Taking? Authorizing Provider  acetaminophen  (TYLENOL ) 500 MG tablet Take 500-1,000 mg by mouth every 6 (six) hours as needed for mild pain.    [provider]  Darunavir -Cobicistat-Emtricitabine-Tenofovir Alafenamide (SYMTUZA ) 800-150-200-10 MG TABS Take 1 tablet by mouth daily with breakfast. 10/31/23   Kuppelweiser, Cassie L, RPH-CPP  diphenhydrAMINE  (BENADRYL ) 25 MG tablet Take 1 tablet (25 mg total) by mouth every 6 (six) hours as needed. 02/26/23   Patt Alm Macho, MD  omeprazole  (PRILOSEC) 20 MG capsule Take 1 capsule (20 mg total) by mouth daily. Patient not taking: Reported on 02/14/2019 06/24/18 02/14/19  Vicky Charleston, PA-C    Allergies: Patient has no known allergies.    Review of Systems  Constitutional:  Negative for fever.  Skin:  Positive for color change.  All other systems reviewed and are negative.   Updated Vital Signs BP (!) 146/79   Pulse 83   Temp 98.1 F (36.7 C)   Resp 18   SpO2 98%   Physical Exam Vitals and nursing note reviewed.  Constitutional:      Appearance: He is well-developed. He is not ill-appearing.  HENT:     Head: Normocephalic and atraumatic.  Eyes:     Pupils: Pupils are equal, round, and reactive to light.  Cardiovascular:     Rate  and Rhythm: Normal rate and regular rhythm.  Pulmonary:     Effort: Pulmonary effort is normal. No respiratory distress.  Musculoskeletal:     Cervical back: Neck supple.     Comments: Diffuse swelling of the dorsum of the left hand, no significant erythema, normal range of motion at the wrist  Lymphadenopathy:     Cervical: No cervical adenopathy.  Skin:    General: Skin is warm and dry.     Comments: Slight erythema of the left hand, no obvious bites  Neurological:     Mental Status: He is alert and oriented to person, place, and time.     (all labs ordered are listed, but only abnormal results are displayed) Labs Reviewed - No data to display  EKG: None  Radiology: No results found.   Procedures   Medications Ordered in the ED  diphenhydrAMINE  (BENADRYL ) capsule 25 mg (has no administration in time range)                                    Medical Decision Making  This patient presents to the ED for concern of reaction to insect sting, this involves an extensive number of treatment options, and is a complaint that carries with it a high risk of complications  and morbidity.  I considered the following differential and admission for this acute, potentially life threatening condition.  The differential diagnosis includes bite, contact dermatitis, cellulitis  MDM:    This is a 38 year old male who presents with concern for reaction to insect exposure.  He is nontoxic.  Denies systemic symptoms.  Hand is slightly swollen and erythematous but not hot.  He reports that it is itchy which would favor potential local allergic response.  He has not taken anything.  Patient given Benadryl .  Recommend ice and elevation.  (Labs, imaging, consults)  Labs: I Ordered, and personally interpreted labs.  The pertinent results include: None  Imaging Studies ordered: I ordered imaging studies including none I independently visualized and interpreted imaging. I agree with the  radiologist interpretation  Additional history obtained from chart review.  External records from outside source obtained and reviewed including prior evaluations  Cardiac Monitoring: The patient was not maintained on a cardiac monitor.  If on the cardiac monitor, I personally viewed and interpreted the cardiac monitored which showed an underlying rhythm of: N/A  Reevaluation: After the interventions noted above, I reevaluated the patient and found that they have :stayed the same  Social Determinants of Health:  lives independently  Disposition: Discharge  Co morbidities that complicate the patient evaluation  Past Medical History:  Diagnosis Date   ADHD (attention deficit hyperactivity disorder)    Chronic hepatitis C without hepatic coma (HCC) 05/12/2020   Drug abuse (HCC)    meth, marijuana   HIV positive (HCC)      Medicines Meds ordered this encounter  Medications   diphenhydrAMINE  (BENADRYL ) capsule 25 mg    I have reviewed the patients home medicines and have made adjustments as needed  Problem List / ED Course: Problem List Items Addressed This Visit   None Visit Diagnoses       Local reaction to insect sting, undetermined intent, initial encounter    -  Primary                Final diagnoses:  Local reaction to insect sting, undetermined intent, initial encounter    ED Discharge Orders     None          Jayden Rudge, Charmaine FALCON, MD 02/17/24 [phone]

## 2024-02-15 NOTE — Discharge Instructions (Signed)
 You were seen today with concerns for reaction to bites.  Given that it is itchy it is likely an allergic response.  Take Benadryl  as needed.  Keep hand elevated to reduce swelling.

## 2024-02-15 NOTE — ED Triage Notes (Signed)
 Pt reports concern for insect bite to the left hand. Pt reports he thinks it might be an ant bite because he was putting on gloves and notice ants coming from it. Pt left hand has swelling.Pt reports being seen multiples times in the past for same thing.

## 2024-02-19 NOTE — Progress Notes (Deleted)
 HPI: [name] is a 38 y.o. male who presents to the [facility] pharmacy clinic for HIV follow-up.  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   DARUNAVIR -COBICISTAT-EMTRICITABINE-TENOFOVIR ALAFENAMIDE (SYMTUZA ) 800-150-200-10 MG TABS    Take 1 tablet by mouth daily with breakfast.   DIPHENHYDRAMINE  (BENADRYL ) 25 MG TABLET    Take 1 tablet (25 mg total) by mouth every 6 (six) hours as needed.  Modified Medications   No medications on file  Discontinued Medications   No medications on file    Allergies: No Known Allergies  Past Medical History: Past Medical History:  Diagnosis Date   ADHD (attention deficit hyperactivity disorder)    Chronic hepatitis C without hepatic coma (HCC) 05/12/2020   Drug abuse (HCC)    meth, marijuana   HIV positive (HCC)     Social History: Social History   Socioeconomic History   Marital status: Single    Spouse name: Not on file   Number of children: Not on file   Years of education: Not on file   Highest education level: Not on file  Occupational History   Not on file  Tobacco Use   Smoking status: Every Day    Current packs/day: 1.00     Average packs/day: 1 pack/day for 10.0 years (10.0 ttl pk-yrs)    Types: Cigarettes, E-cigarettes   Smokeless tobacco: Never  Vaping Use   Vaping status: Never Used  Substance and Sexual Activity   Alcohol use: Not Currently   Drug use: Yes    Types: Methamphetamines, IV    Comment: meth last use yesterday   Sexual activity: Yes    Birth control/protection: None    Comment: declined condoms  Other Topics Concern   Not on file  Social History Narrative   Not on file   Social Drivers of Health   Financial Resource Strain: Not on file  Food Insecurity: Not on file  Transportation Needs: Not on file  Physical Activity: Not on file  Stress: Not on file  Social Connections: Unknown (09/29/2022)   Received from [facility]   Social Network    Social Network: Not on file    Labs: Lab Results  Component Value Date   HIV1RNAQUANT 57,600 (H) 10/10/2023   HIV1RNAQUANT 100,000 (H) 02/28/2023   HIV1RNAQUANT 508 (H) 10/10/2022   CD4TABS 846 10/10/2023   CD4TABS 550 02/01/2023   CD4TABS 755 10/10/2022    RPR and STI Lab Results  Component Value Date   LABRPR REACTIVE (A) 10/10/2023   LABRPR REACTIVE (A) 02/28/2023   LABRPR REACTIVE (A) 10/10/2022   LABRPR REACTIVE (A) 08/05/2022   LABRPR REACTIVE (A) 03/16/2021   RPRTITER 1:2 (H)  10/10/2023   RPRTITER 1:2 (H) 02/28/2023   RPRTITER 1:2 (H) 10/10/2022   RPRTITER 1:2 (H) 08/05/2022   RPRTITER 1:8 (H) 03/16/2021    STI Results GC GC CT CT  Latest Ref Rng & Units  NEGATIVE  NEGATIVE  10/15/2020  8:43 PM Negative   Negative    05/12/2020 10:42 AM Negative   Negative    01/13/2020  9:13 AM Negative    Negative    Negative   Negative    Positive    Negative    10/09/2019 10:24 AM Negative   Negative    02/24/2019 12:00 AM Negative   Negative    06/06/2018 12:00 AM Negative   Negative    10/14/2017 12:00 AM Negative   Negative    01/26/2014  1:45 PM  NEGATIVE   NEGATIVE   07/23/2010  6:11 PM   NEGATIVE (NOTE)   Testing performed using the BD ProbeTec Qx Chlamydia trachomatis and Neisseria gonorrhea amplified DNA assay.  Performed at:  [facility]               [address]                [place], [place] [address]               [id number]    12/09/2009  1:10 AM   NEGATIVE (NOTE)  Testing performed using the BD Probetec ET Chlamydia trachomatis and Neisseria gonorrhea amplified DNA assay.    03/08/2007  3:27 AM   NEGATIVE (NOTE)  Testing performed using the BD Probetec ET Chlamydia trachomatis and Neisseria gonorrhea amplified DNA assay.      Hepatitis B Lab Results  Component Value Date   HEPBSAB REACTIVE (A) 10/09/2019   HEPBSAG NON-REACTIVE 10/10/2022   HEPBCAB NON-REACTIVE 10/09/2019   Hepatitis C Lab Results  Component Value Date   HEPCAB REACTIVE (A) 10/09/2019   HCVRNAPCRQN <15 NOT DETECTED 10/10/2023   Hepatitis A Lab Results  Component Value Date   HAV NON-REACTIVE 10/09/2019   Lipids: Lab Results  Component Value Date   CHOL 137 08/05/2022   TRIG 185 (H) 08/05/2022   HDL 40 08/05/2022   CHOLHDL 3.4 08/05/2022   VLDL 31 07/10/2019   LDLCALC 71 08/05/2022    Current HIV Regimen: Symtuza  (last filled August 2024)  Assessment: [name] presents to clinic today for HIV follow-up. Last followed with Cassie in April and last saw Cathlyn in early 2024. Still inconsistent in taking Symtuza . Last filled with pharmacy in August 2024. Most recent viral load have remained elevated at 57,600 (March 2025) and 100,000 (August 2024). Discussed ideas to improve adherence. *** Will check viral load today and schedule follow up with Cathlyn in one month.  Eligible for HAV and Menveo which he declines today.   Plan: - Refill Symtuza  - Check HIV RNA - Follow up with Cathlyn on ***  Alan Geralds, PharmD, CPP, BCIDP, AAHIVP Clinical Pharmacist Practitioner Infectious Diseases Clinical Pharmacist [facility] 02/19/2024, 4:28 PM

## 2024-02-20 ENCOUNTER — Ambulatory Visit: Admitting: Pharmacist

## 2024-03-01 ENCOUNTER — Other Ambulatory Visit: Payer: Self-pay

## 2024-03-01 ENCOUNTER — Encounter (HOSPITAL_BASED_OUTPATIENT_CLINIC_OR_DEPARTMENT_OTHER): Payer: Self-pay

## 2024-03-01 ENCOUNTER — Emergency Department (HOSPITAL_BASED_OUTPATIENT_CLINIC_OR_DEPARTMENT_OTHER)
Admission: EM | Admit: 2024-03-01 | Discharge: 2024-03-01 | Discharge status: Against Medical Advice | Attending: Emergency Medicine | Admitting: Emergency Medicine

## 2024-03-01 DIAGNOSIS — Z21 Asymptomatic human immunodeficiency virus [HIV] infection status: Secondary | ICD-10-CM | POA: Diagnosis not present

## 2024-03-01 DIAGNOSIS — Z5329 Procedure and treatment not carried out because of patient's decision for other reasons: Secondary | ICD-10-CM | POA: Insufficient documentation

## 2024-03-01 DIAGNOSIS — L02211 Cutaneous abscess of abdominal wall: Secondary | ICD-10-CM | POA: Diagnosis present

## 2024-03-01 MED ORDER — DOXYCYCLINE HYCLATE 100 MG PO CAPS: 100.0000 mg | ORAL_CAPSULE | Freq: Two times a day (BID) | ORAL | 0 refills | Status: AC

## 2024-03-01 MED ORDER — CEFTRIAXONE SODIUM 1 G IJ SOLR
1.0000 g | Freq: Once | INTRAMUSCULAR | Status: DC
  Filled 2024-03-01: qty 10

## 2024-03-01 MED ORDER — DOXYCYCLINE HYCLATE 100 MG PO TABS
100.0000 mg | ORAL_TABLET | Freq: Once | ORAL | Status: DC
  Filled 2024-03-01: qty 1

## 2024-03-01 NOTE — ED Notes (Addendum)
 Pt left before meds was given.

## 2024-03-01 NOTE — ED Provider Notes (Signed)
 [place] [facility] DEPART[facility] AT [facility] Provider Note   CSN: [id number] Arrival date & time: 03/01/24  9944     Patient presents with: Cellulitis   [name] is a 38 y.o. male.   Patient with a history of HIV not on medications at this time here with wound to his lower abdomen.  Reports he had a blemish that looked like a pimple that he noticed about 3 days ago messed with it.  Has increasing pain, redness and drainage since then.  No fever, chills, nausea or vomiting.  No chest pain or shortness of breath.  He does not know his HIV counts.  The history is provided by the patient.       Prior to Admission medications   Medication Sig Start Date End Date Taking? Authorizing Provider  acetaminophen  (TYLENOL ) 500 MG tablet Take 500-1,000 mg by mouth every 6 (six) hours as needed for mild pain.    [provider]  Darunavir -Cobicistat-Emtricitabine-Tenofovir Alafenamide (SYMTUZA ) 800-150-200-10 MG TABS Take 1 tablet by mouth daily with breakfast. 10/31/23   Kuppelweiser, Cassie L, RPH-CPP  diphenhydrAMINE  (BENADRYL ) 25 MG tablet Take 1 tablet (25 mg total) by mouth every 6 (six) hours as needed. 02/26/23   Patt Alm Macho, MD  omeprazole  (PRILOSEC) 20 MG capsule Take 1 capsule (20 mg total) by mouth daily. Patient not taking: Reported on 02/14/2019 06/24/18 02/14/19  Vicky Charleston, PA-C    Allergies: Patient has no known allergies.    Review of Systems  Constitutional:  Negative for activity change, appetite change and fever.  HENT:  Negative for congestion and rhinorrhea.   Respiratory:  Negative for cough, chest tightness and shortness of breath.   Cardiovascular:  Negative for chest pain.  Gastrointestinal:  Negative for abdominal pain, nausea and vomiting.  Genitourinary:  Negative for dysuria.  Musculoskeletal:  Negative for arthralgias and myalgias.  Skin:  Positive for wound.  Neurological:  Negative for dizziness, weakness and headaches.    all other systems are negative except as noted in the HPI and PMH.    Updated Vital Signs BP (!) 133/106   Pulse 96   Temp 97.6 F (36.4 C) (Temporal)   Resp 17   Ht 6' 1 (1.854 m)   Wt 81.6 kg   SpO2 100%   BMI 23.75 kg/m   Physical Exam Vitals and nursing note reviewed.  Constitutional:      General: He is not in acute distress.    Appearance: He is well-developed.  HENT:     Head: Normocephalic and atraumatic.     Mouth/Throat:     Pharynx: No oropharyngeal exudate.  Eyes:     Conjunctiva/sclera: Conjunctivae normal.     Pupils: Pupils are equal, round, and reactive to light.  Neck:     Comments: No meningismus. Cardiovascular:     Rate and Rhythm: Normal rate and regular rhythm.     Heart sounds: Normal heart sounds. No murmur heard. Pulmonary:     Effort: Pulmonary effort is normal. No respiratory distress.     Breath sounds: Normal breath sounds.  Abdominal:     Palpations: Abdomen is soft.     Tenderness: There is no abdominal tenderness. There is no guarding or rebound.     Comments: Cellulitis and abscess to right lower quadrant as depicted. Able to express some purulence with pressure  Musculoskeletal:        General: No tenderness. Normal range of motion.     Cervical back: Normal range  of motion and neck supple.  Skin:    General: Skin is warm.  Neurological:     Mental Status: He is alert and oriented to person, place, and time.     Cranial Nerves: No cranial nerve deficit.     Motor: No abnormal muscle tone.     Coordination: Coordination normal.     Comments:  5/5 strength throughout. CN 2-12 intact.Equal grip strength.   Psychiatric:        Behavior: Behavior normal.     (all labs ordered are listed, but only abnormal results are displayed) Labs Reviewed - No data to display  EKG: None  Radiology: No results found.   Procedures   Medications Ordered in the ED - No data to display                                  Medical  Decision Making Amount and/or Complexity of Data Reviewed Labs: ordered. Decision-making details documented in ED Course. Radiology: ordered and independent interpretation performed. Decision-making details documented in ED Course. ECG/medicine tests: ordered and independent interpretation performed. Decision-making details documented in ED Course.  Risk Prescription drug management.   Abdominal wall abscess with cellulitis.  Does not appear toxic or septic.  Does have HIV.  Able to express some purulence with pressure.  Recommend formal incision and drainage.  Patient adamantly declines this stating he has a fear of pain and needles. States he does not want incision and drainage and just wants antibiotics and a recheck. Discussed this is not the ideal treatment I would recommend incision to drain abscess completely. He is adamant he does not want incision and drainage.  Will give IM antibiotics, p.o. antibiotics for home, wound care instructions with wound check in 2 days. He appears to have capacity to decline incision.  Plan was to give IM Rocephin  as well as p.o. doxycycline .  Patient apparently left the ED before receiving these medications at did not inform staff.  Prescription for doxycycline  sent to his pharmacy.     Final diagnoses:  Abscess and cellulitis of gluteal region    ED Discharge Orders     None          Reyaan Thoma, Garnette, MD 03/01/24 [phone]

## 2024-03-01 NOTE — ED Triage Notes (Signed)
 Pt POV reporting worsening abscess on lower abd, reddened area noted. Deneis fever.

## 2024-03-13 ENCOUNTER — Emergency Department (HOSPITAL_COMMUNITY)
Admission: EM | Admit: 2024-03-13 | Discharge: 2024-03-14 | Discharge status: Against Medical Advice | Attending: Emergency Medicine | Admitting: Emergency Medicine

## 2024-03-13 DIAGNOSIS — Z5321 Procedure and treatment not carried out due to patient leaving prior to being seen by health care provider: Secondary | ICD-10-CM | POA: Diagnosis not present

## 2024-03-13 DIAGNOSIS — M25571 Pain in right ankle and joints of right foot: Secondary | ICD-10-CM | POA: Insufficient documentation

## 2024-03-13 DIAGNOSIS — M25572 Pain in left ankle and joints of left foot: Secondary | ICD-10-CM | POA: Insufficient documentation

## 2024-03-13 NOTE — ED Triage Notes (Signed)
 Pt reports he has bil ankle pain that started around 2 to 3 pm today. Pt states he walks a lot. Pt states that they visibly look okay.

## 2024-03-13 NOTE — ED Notes (Signed)
 Called x5 times no answer

## 2024-03-27 ENCOUNTER — Emergency Department (HOSPITAL_COMMUNITY): Admission: EM | Admit: 2024-03-27 | Discharge: 2024-03-27 | Disposition: A | Discharge status: Home / Self Care

## 2024-03-27 NOTE — ED Notes (Signed)
Pt called for triage x3 no answer 

## 2024-03-27 NOTE — ED Notes (Signed)
 Pt name called for triage again.

## 2024-03-27 NOTE — Progress Notes (Deleted)
 HPI: [name] is a 38 y.o. male who presents to the [facility] pharmacy clinic for HIV follow-up.  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   DARUNAVIR -COBICISTAT-EMTRICITABINE-TENOFOVIR ALAFENAMIDE (SYMTUZA ) 800-150-200-10 MG TABS    Take 1 tablet by mouth daily with breakfast.   DIPHENHYDRAMINE  (BENADRYL ) 25 MG TABLET    Take 1 tablet (25 mg total) by mouth every 6 (six) hours as needed.   DOXYCYCLINE  (VIBRAMYCIN ) 100 MG CAPSULE    Take 1 capsule (100 mg total) by mouth 2 (two) times daily.  Modified Medications   No medications on file  Discontinued Medications   No medications on file    Labs: Lab Results  Component Value Date   HIV1RNAQUANT 57,600 (H) 10/10/2023   HIV1RNAQUANT 100,000 (H) 02/28/2023   HIV1RNAQUANT 508 (H) 10/10/2022   CD4TABS 846 10/10/2023   CD4TABS 550 02/01/2023   CD4TABS 755 10/10/2022    RPR and STI Lab Results  Component Value Date   LABRPR REACTIVE (A) 10/10/2023   LABRPR REACTIVE (A) 02/28/2023   LABRPR REACTIVE (A) 10/10/2022   LABRPR REACTIVE (A) 08/05/2022   LABRPR REACTIVE (A) 03/16/2021   RPRTITER 1:2 (H) 10/10/2023   RPRTITER 1:2  (H) 02/28/2023   RPRTITER 1:2 (H) 10/10/2022   RPRTITER 1:2 (H) 08/05/2022   RPRTITER 1:8 (H) 03/16/2021    STI Results GC GC CT CT  Latest Ref Rng & Units  NEGATIVE  NEGATIVE  10/15/2020  8:43 PM Negative   Negative    05/12/2020 10:42 AM Negative   Negative    01/13/2020  9:13 AM Negative    Negative    Negative   Negative    Positive    Negative    10/09/2019 10:24 AM Negative   Negative    02/24/2019 12:00 AM Negative   Negative    06/06/2018 12:00 AM Negative   Negative    10/14/2017 12:00 AM Negative   Negative    01/26/2014  1:45 PM  NEGATIVE   NEGATIVE   07/23/2010  6:11 PM   NEGATIVE (NOTE)  Testing performed using the BD ProbeTec Qx Chlamydia trachomatis and Neisseria gonorrhea amplified DNA assay.  Performed at:  [facility]               [address]                [place], [place] [address]               [id number]    12/09/2009  1:10 AM   NEGATIVE (NOTE)  Testing  performed using the BD Probetec ET Chlamydia trachomatis and Neisseria gonorrhea amplified DNA assay.    03/08/2007  3:27 AM   NEGATIVE (NOTE)  Testing performed using the BD Probetec ET Chlamydia trachomatis and Neisseria gonorrhea amplified DNA assay.      Hepatitis B Lab Results  Component Value Date   HEPBSAB REACTIVE (A) 10/09/2019   HEPBSAG NON-REACTIVE 10/10/2022   HEPBCAB NON-REACTIVE 10/09/2019   Hepatitis C Lab Results  Component Value Date   HEPCAB REACTIVE (A) 10/09/2019   HCVRNAPCRQN <15 NOT DETECTED 10/10/2023   Hepatitis A Lab Results  Component Value Date   HAV NON-REACTIVE 10/09/2019   Lipids: Lab Results  Component Value Date   CHOL 137 08/05/2022   TRIG 185 (H) 08/05/2022   HDL 40 08/05/2022   CHOLHDL 3.4 08/05/2022   VLDL 31 07/10/2019   LDLCALC 71 08/05/2022    Current HIV Regimen: ***  Assessment: [name] presents today for HIV follow-up. Last [facility] visit on 11/08/23, he has a history of missing multiple  appointments and non-adherence. Last HIV RNA of 57,600 and CD4 of 846 on 10/10/23. He last filled his Symtuza  when he was seen in-clinc in April (couriered from [facility]). ***  Due for STI screening, last gonorrhea and chlamydia tests in 2022 and syphilis in 09/2023. ***  Due for HAV, Influenza, Shingrix, Menevo, HPV, and Pneumovax.   Plan: *** HIV RNA and routine lipids *** Will plan to recheck HepC RNA in March of 2026 for annual follow-up  Scheduled follow-ups on ***  Woodie Jock, PharmD PGY1 Pharmacy Resident  03/27/2024

## 2024-03-28 ENCOUNTER — Ambulatory Visit: Admitting: Pharmacist

## 2024-03-28 DIAGNOSIS — Z113 Encounter for screening for infections with a predominantly sexual mode of transmission: Secondary | ICD-10-CM

## 2024-03-28 DIAGNOSIS — B20 Human immunodeficiency virus [HIV] disease: Secondary | ICD-10-CM

## 2024-03-29 ENCOUNTER — Emergency Department (HOSPITAL_COMMUNITY)
Admission: EM | Admit: 2024-03-29 | Discharge: 2024-03-29 | Discharge status: Against Medical Advice | Attending: Emergency Medicine | Admitting: Emergency Medicine

## 2024-03-29 ENCOUNTER — Encounter (HOSPITAL_COMMUNITY): Payer: Self-pay

## 2024-03-29 ENCOUNTER — Other Ambulatory Visit: Payer: Self-pay

## 2024-03-29 DIAGNOSIS — R2232 Localized swelling, mass and lump, left upper limb: Secondary | ICD-10-CM | POA: Insufficient documentation

## 2024-03-29 DIAGNOSIS — Z5321 Procedure and treatment not carried out due to patient leaving prior to being seen by health care provider: Secondary | ICD-10-CM | POA: Diagnosis not present

## 2024-03-29 NOTE — ED Triage Notes (Signed)
 Pt states he has a lump under his left arm that came up 2 days ago. States it is inside & not on his skin. Denies any other s/s at time of triage.

## 2024-04-09 ENCOUNTER — Other Ambulatory Visit (HOSPITAL_COMMUNITY)
Admission: RE | Admit: 2024-04-09 | Discharge: 2024-04-09 | Disposition: A | Discharge status: Home / Self Care | Source: Ambulatory Visit | Attending: Family | Admitting: Family

## 2024-04-09 ENCOUNTER — Other Ambulatory Visit

## 2024-04-09 ENCOUNTER — Other Ambulatory Visit: Payer: Self-pay

## 2024-04-09 DIAGNOSIS — Z113 Encounter for screening for infections with a predominantly sexual mode of transmission: Secondary | ICD-10-CM | POA: Diagnosis present

## 2024-04-09 DIAGNOSIS — B20 Human immunodeficiency virus [HIV] disease: Secondary | ICD-10-CM

## 2024-04-09 DIAGNOSIS — Z79899 Other long term (current) drug therapy: Secondary | ICD-10-CM

## 2024-04-10 ENCOUNTER — Ambulatory Visit (HOSPITAL_BASED_OUTPATIENT_CLINIC_OR_DEPARTMENT_OTHER): Admitting: Family Medicine

## 2024-04-10 LAB — T-HELPER CELL (CD4) - (RCID CLINIC ONLY)
CD4 % Helper T Cell: 33 % (ref 33–65)
CD4 T Cell Abs: 732 /uL (ref 400–1790)

## 2024-04-10 LAB — URINE CYTOLOGY ANCILLARY ONLY
Chlamydia: NEGATIVE
Comment: NEGATIVE
Comment: NORMAL
Neisseria Gonorrhea: NEGATIVE

## 2024-04-11 LAB — COMPLETE METABOLIC PANEL WITHOUT GFR
AG Ratio: 1.4 (calc) (ref 1.0–2.5)
ALT: 12 U/L (ref 9–46)
AST: 17 U/L (ref 10–40)
Albumin: 4.5 g/dL (ref 3.6–5.1)
Alkaline phosphatase (APISO): 69 U/L (ref 36–130)
BUN: 15 mg/dL (ref 7–25)
CO2: 30 mmol/L (ref 20–32)
Calcium: 9.4 mg/dL (ref 8.6–10.3)
Chloride: 100 mmol/L (ref 98–110)
Creat: 1.07 mg/dL (ref 0.60–1.26)
Globulin: 3.2 g/dL (ref 1.9–3.7)
Glucose, Bld: 77 mg/dL (ref 65–99)
Potassium: 3.9 mmol/L (ref 3.5–5.3)
Sodium: 138 mmol/L (ref 135–146)
Total Bilirubin: 0.4 mg/dL (ref 0.2–1.2)
Total Protein: 7.7 g/dL (ref 6.1–8.1)

## 2024-04-11 LAB — T PALLIDUM AB: T Pallidum Abs: POSITIVE — AB

## 2024-04-11 LAB — CBC WITH DIFFERENTIAL/PLATELET
Absolute Lymphocytes: 2441 {cells}/uL (ref 850–3900)
Absolute Monocytes: 589 {cells}/uL (ref 200–950)
Basophils Absolute: 38 {cells}/uL (ref 0–200)
Basophils Relative: 0.7 %
Eosinophils Absolute: 178 {cells}/uL (ref 15–500)
Eosinophils Relative: 3.3 %
HCT: 43 % (ref 38.5–50.0)
Hemoglobin: 14.7 g/dL (ref 13.2–17.1)
MCH: 30.8 pg (ref 27.0–33.0)
MCHC: 34.2 g/dL (ref 32.0–36.0)
MCV: 90.1 fL (ref 80.0–100.0)
MPV: 8.6 fL (ref 7.5–12.5)
Monocytes Relative: 10.9 %
Neutro Abs: 2155 {cells}/uL (ref 1500–7800)
Neutrophils Relative %: 39.9 %
Platelets: 328 Thousand/uL (ref 140–400)
RBC: 4.77 Million/uL (ref 4.20–5.80)
RDW: 12.9 % (ref 11.0–15.0)
Total Lymphocyte: 45.2 %
WBC: 5.4 Thousand/uL (ref 3.8–10.8)

## 2024-04-11 LAB — LIPID PANEL
Cholesterol: 132 mg/dL (ref <200)
HDL: 39 mg/dL — ABNORMAL LOW (ref >=40)
LDL Cholesterol (Calc): 77 mg/dL
Non-HDL Cholesterol (Calc): 93 mg/dL (ref <130)
Total CHOL/HDL Ratio: 3.4 (calc) (ref <5.0)
Triglycerides: 81 mg/dL (ref <150)

## 2024-04-11 LAB — RPR: RPR Ser Ql: REACTIVE — AB

## 2024-04-11 LAB — HIV-1 RNA QUANT-NO REFLEX-BLD
HIV 1 RNA Quant: 22900 {copies}/mL — ABNORMAL HIGH
HIV-1 RNA Quant, Log: 4.36 {Log_copies}/mL — ABNORMAL HIGH

## 2024-04-11 LAB — RPR TITER: RPR Titer: 1:4 {titer} — ABNORMAL HIGH

## 2024-04-16 ENCOUNTER — Ambulatory Visit: Admitting: Family

## 2024-04-22 NOTE — Progress Notes (Deleted)
 HPI: [name] is a 38 y.o. male who presents to the [facility] pharmacy clinic for HIV follow-up.  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   DARUNAVIR -COBICISTAT-EMTRICITABINE-TENOFOVIR ALAFENAMIDE (SYMTUZA ) 800-150-200-10 MG TABS    Take 1 tablet by mouth daily with breakfast.   DIPHENHYDRAMINE  (BENADRYL ) 25 MG TABLET    Take 1 tablet (25 mg total) by mouth every 6 (six) hours as needed.   DOXYCYCLINE  (VIBRAMYCIN ) 100 MG CAPSULE    Take 1 capsule (100 mg total) by mouth 2 (two) times daily.  Modified Medications   No medications on file  Discontinued Medications   No medications on file    Labs: Lab Results  Component Value Date   HIV1RNAQUANT 22,900 (H) 04/09/2024   HIV1RNAQUANT 57,600 (H) 10/10/2023   HIV1RNAQUANT 100,000 (H) 02/28/2023   CD4TABS 732 04/09/2024   CD4TABS 846 10/10/2023   CD4TABS 550 02/01/2023    RPR and STI Lab Results  Component Value Date   LABRPR REACTIVE (A) 04/09/2024   LABRPR REACTIVE (A) 10/10/2023   LABRPR REACTIVE (A) 02/28/2023   LABRPR REACTIVE (A) 10/10/2022   LABRPR REACTIVE (A) 08/05/2022   RPRTITER 1:4 (H) 04/09/2024   RPRTITER 1:2 (H)  10/10/2023   RPRTITER 1:2 (H) 02/28/2023   RPRTITER 1:2 (H) 10/10/2022   RPRTITER 1:2 (H) 08/05/2022    STI Results GC GC CT CT  Latest Ref Rng & Units  NEGATIVE  NEGATIVE  04/09/2024  2:06 PM Negative   Negative    10/15/2020  8:43 PM Negative   Negative    05/12/2020 10:42 AM Negative   Negative    01/13/2020  9:13 AM Negative    Negative    Negative   Negative    Positive    Negative    10/09/2019 10:24 AM Negative   Negative    02/24/2019 12:00 AM Negative   Negative    06/06/2018 12:00 AM Negative   Negative    10/14/2017 12:00 AM Negative   Negative    01/26/2014  1:45 PM  NEGATIVE   NEGATIVE   07/23/2010  6:11 PM   NEGATIVE (NOTE)  Testing performed using the BD ProbeTec Qx Chlamydia trachomatis and Neisseria gonorrhea amplified DNA assay.  Performed at:  [facility]               [address]                [place], [place] [address]               [id number]  12/09/2009  1:10 AM   NEGATIVE (NOTE)  Testing performed using the BD Probetec ET Chlamydia trachomatis and Neisseria gonorrhea amplified DNA assay.    03/08/2007  3:27 AM   NEGATIVE (NOTE)  Testing performed using the BD Probetec ET Chlamydia trachomatis and Neisseria gonorrhea amplified DNA assay.      Hepatitis B Lab Results  Component Value Date   HEPBSAB REACTIVE (A) 10/09/2019   HEPBSAG NON-REACTIVE 10/10/2022   HEPBCAB NON-REACTIVE 10/09/2019   Hepatitis C Lab Results  Component Value Date   HEPCAB REACTIVE (A) 10/09/2019   HCVRNAPCRQN <15 NOT DETECTED 10/10/2023   Hepatitis A Lab Results  Component Value Date   HAV NON-REACTIVE 10/09/2019   Lipids: Lab Results  Component Value Date   CHOL 132 04/09/2024   TRIG 81 04/09/2024   HDL 39 (L) 04/09/2024   CHOLHDL 3.4 04/09/2024   VLDL 31 07/10/2019   LDLCALC 77 04/09/2024    Current HIV Regimen: ***  Assessment: [name] is here for HIV follow up due to nonadherence. He is currently  prescribed Symtuza . Last HIV RNA was 22,900 and CD4 count was 732 on 04/09/24. Last fill was in May.   Plan: ***  [name] L. Nadra Hritz, PharmD, BCIDP, AAHIVP, CPP Clinical Pharmacist Practitioner - Infectious Diseases Clinical Pharmacist Lead - [facility]

## 2024-04-23 ENCOUNTER — Ambulatory Visit: Admitting: Pharmacist

## 2024-04-27 ENCOUNTER — Other Ambulatory Visit: Payer: Self-pay

## 2024-04-27 ENCOUNTER — Emergency Department (HOSPITAL_BASED_OUTPATIENT_CLINIC_OR_DEPARTMENT_OTHER)
Admission: EM | Admit: 2024-04-27 | Discharge: 2024-04-27 | Discharge status: Against Medical Advice | Attending: Emergency Medicine | Admitting: Emergency Medicine

## 2024-04-27 ENCOUNTER — Emergency Department (HOSPITAL_BASED_OUTPATIENT_CLINIC_OR_DEPARTMENT_OTHER)

## 2024-04-27 DIAGNOSIS — N50812 Left testicular pain: Secondary | ICD-10-CM | POA: Insufficient documentation

## 2024-04-27 DIAGNOSIS — Z5321 Procedure and treatment not carried out due to patient leaving prior to being seen by health care provider: Secondary | ICD-10-CM | POA: Diagnosis not present

## 2024-04-27 NOTE — ED Notes (Signed)
 Called for Pt no answer. Pt seen by Security getting a Cigarette from someone and then leaving the Campus by foot. This tech went outside to search for Patient, but Pt was not insight.

## 2024-04-27 NOTE — ED Triage Notes (Signed)
 C/o left testicle pain x 5 months. Reports lump that varies in size. When asked what changed to prompt ED visit, he said it was random

## 2024-04-30 ENCOUNTER — Telehealth: Payer: Self-pay

## 2024-04-30 NOTE — Telephone Encounter (Signed)
 Detectable Viral Load Intervention (DVL)  Most recent VL:  HIV 1 RNA Quant  Date Value Ref Range Status  04/09/2024 22,900 (H) NOT DETECTED copies/mL Final  10/10/2023 57,600 (H) copies/mL Final  02/28/2023 100,000 (H) copies/mL Final    Last Clinic Visit: 11/08/23  Current ART regimen: Symtuza   Appointment status: patient does not have future appointment scheduled   Medication last dispensed (per chart review): has no filled since 11/2023  Medication Adherence   Not able to assess    Barriers to Care  Not able to assess    Interventions   Called patient to discuss medication adherence and possible barriers to care. Left vm. Will follow up on referral to bridge counseling.  Lorenda CHRISTELLA Code, RMA

## 2024-05-24 ENCOUNTER — Other Ambulatory Visit: Payer: Self-pay
